# Patient Record
Sex: Female | Born: 1970
Health system: Southern US, Community
[De-identification: ages and names within clinical notes are randomized; demographics above are authoritative.]

## PROBLEM LIST (undated history)

## (undated) DIAGNOSIS — F419 Anxiety disorder, unspecified: Secondary | ICD-10-CM

## (undated) DIAGNOSIS — A048 Other specified bacterial intestinal infections: Secondary | ICD-10-CM

## (undated) DIAGNOSIS — F4010 Social phobia, unspecified: Secondary | ICD-10-CM

## (undated) DIAGNOSIS — F429 Obsessive-compulsive disorder, unspecified: Secondary | ICD-10-CM

## (undated) DIAGNOSIS — J302 Other seasonal allergic rhinitis: Secondary | ICD-10-CM

## (undated) DIAGNOSIS — F32A Depression, unspecified: Secondary | ICD-10-CM

## (undated) DIAGNOSIS — F329 Major depressive disorder, single episode, unspecified: Secondary | ICD-10-CM

## (undated) DIAGNOSIS — K589 Irritable bowel syndrome without diarrhea: Secondary | ICD-10-CM

## (undated) HISTORY — DX: Irritable bowel syndrome, unspecified: K58.9

## (undated) HISTORY — PX: TONSILLECTOMY: SUR1361

## (undated) HISTORY — DX: Anxiety disorder, unspecified: F41.9

## (undated) HISTORY — DX: Depression, unspecified: F32.A

## (undated) HISTORY — PX: NASAL SEPTUM SURGERY: SHX37

## (undated) HISTORY — DX: Major depressive disorder, single episode, unspecified: F32.9

## (undated) HISTORY — DX: Social phobia, unspecified: F40.10

## (undated) HISTORY — DX: Obsessive-compulsive disorder, unspecified: F42.9

## (undated) HISTORY — DX: Other seasonal allergic rhinitis: J30.2

## (undated) HISTORY — DX: Other specified bacterial intestinal infections: A04.8

---

## 1998-03-23 ENCOUNTER — Other Ambulatory Visit: Admission: RE | Admit: 1998-03-23 | Discharge: 1998-03-23 | Payer: Self-pay | Admitting: Obstetrics and Gynecology

## 1998-07-27 ENCOUNTER — Other Ambulatory Visit: Admission: RE | Admit: 1998-07-27 | Discharge: 1998-07-27 | Payer: Self-pay | Admitting: Obstetrics and Gynecology

## 1998-12-06 ENCOUNTER — Other Ambulatory Visit: Admission: RE | Admit: 1998-12-06 | Discharge: 1998-12-06 | Payer: Self-pay | Admitting: Obstetrics and Gynecology

## 2004-09-06 ENCOUNTER — Ambulatory Visit: Payer: Self-pay | Admitting: Gastroenterology

## 2004-10-02 ENCOUNTER — Ambulatory Visit: Payer: Self-pay | Admitting: Gastroenterology

## 2004-10-18 ENCOUNTER — Ambulatory Visit: Payer: Self-pay | Admitting: Gastroenterology

## 2004-11-01 ENCOUNTER — Ambulatory Visit: Payer: Self-pay | Admitting: Family Medicine

## 2004-12-09 ENCOUNTER — Other Ambulatory Visit: Admission: RE | Admit: 2004-12-09 | Discharge: 2004-12-09 | Payer: Self-pay | Admitting: Obstetrics and Gynecology

## 2004-12-10 ENCOUNTER — Other Ambulatory Visit: Admission: RE | Admit: 2004-12-10 | Discharge: 2004-12-10 | Payer: Self-pay | Admitting: Obstetrics and Gynecology

## 2004-12-30 ENCOUNTER — Ambulatory Visit: Payer: Self-pay | Admitting: Family Medicine

## 2005-07-08 ENCOUNTER — Inpatient Hospital Stay (HOSPITAL_COMMUNITY): Admission: AD | Admit: 2005-07-08 | Discharge: 2005-07-08 | Payer: Self-pay | Admitting: Obstetrics and Gynecology

## 2005-07-08 IMAGING — US US FETAL BPP W/O NONSTRESS
1 series · 14 of 28 positions shown · non-contrast
Comparison: none

CLINICAL DATA: BPP, assigned gestational age is 36 weeks 3 days.

[Series 1: us fetal bpp w/o nonstress · 0.33mm/px · 14 of 46 slices shown]
[im 2/46]
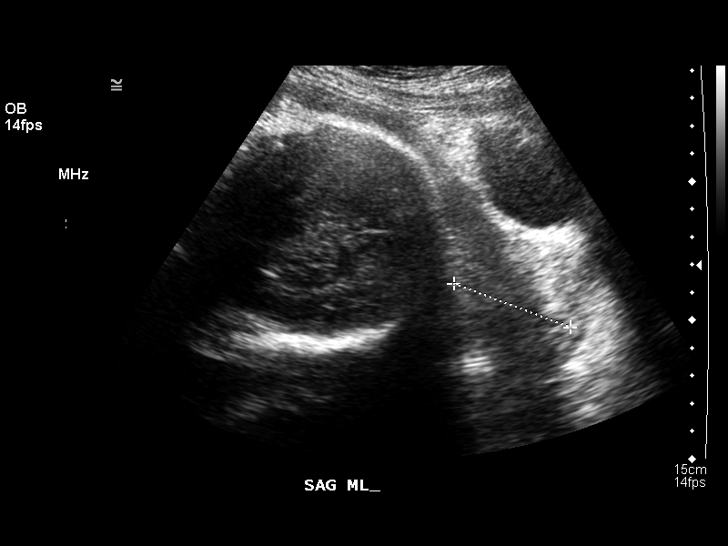
[im 6/46]
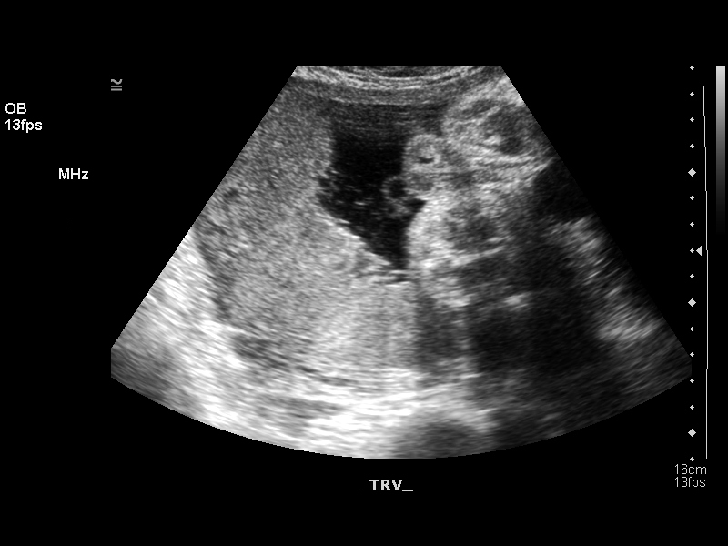
[im 9/46]
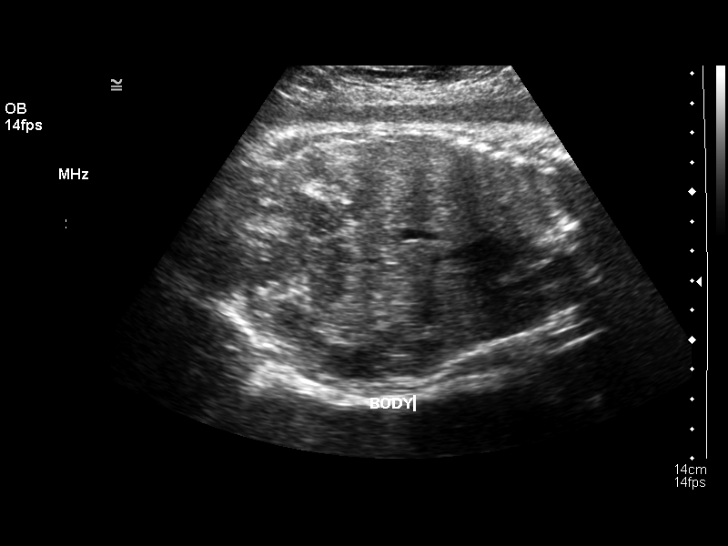
[im 12/46]
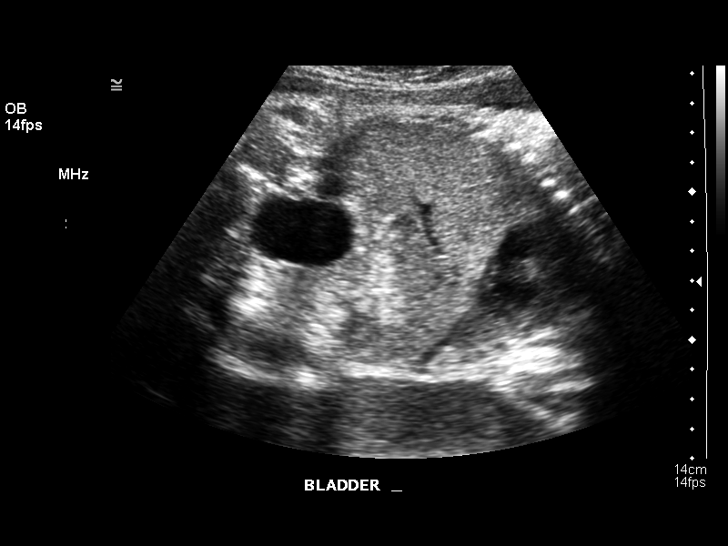
[im 16/46]
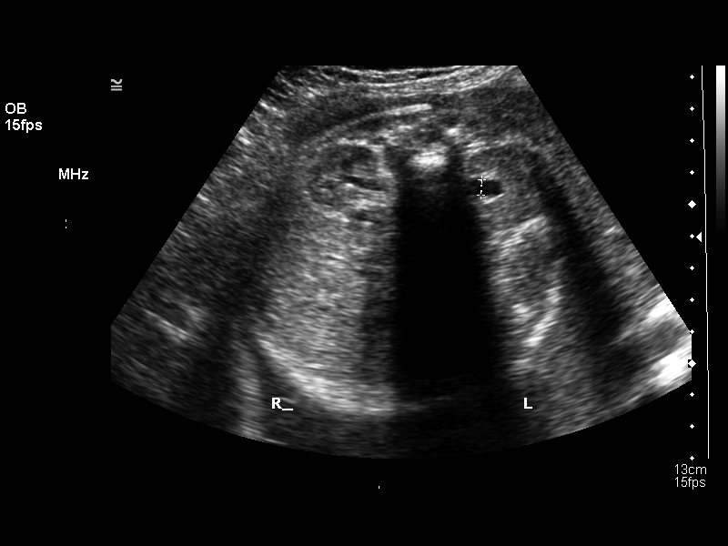
[im 19/46]
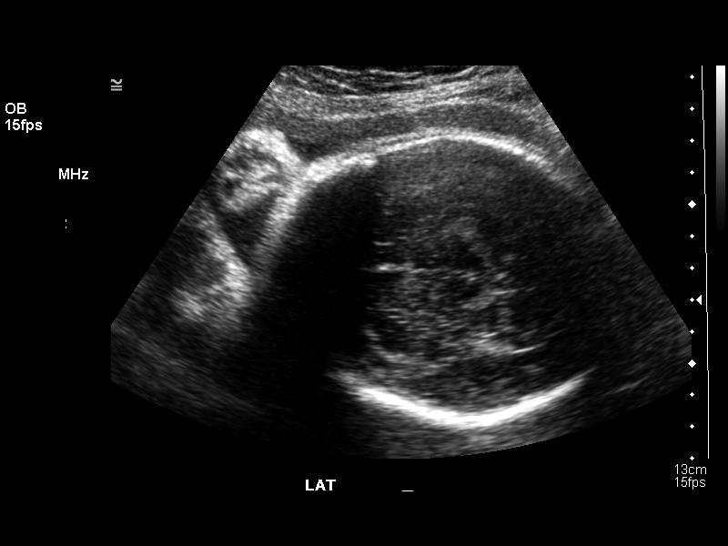
[im 22/46]
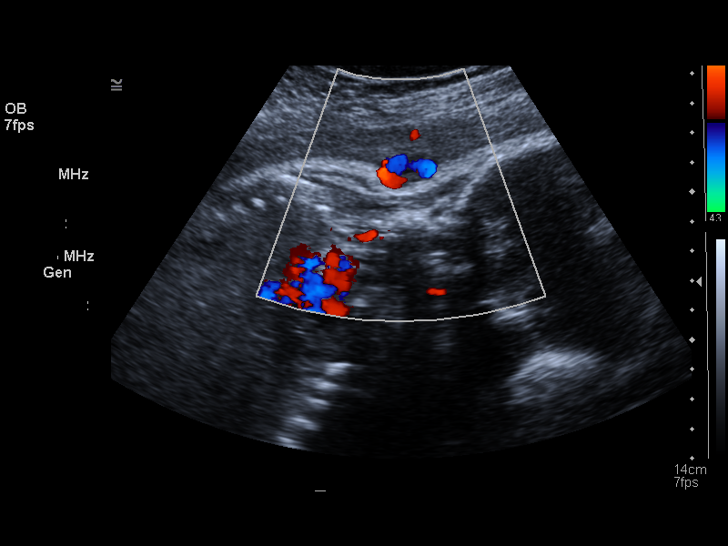
[im 26/46]
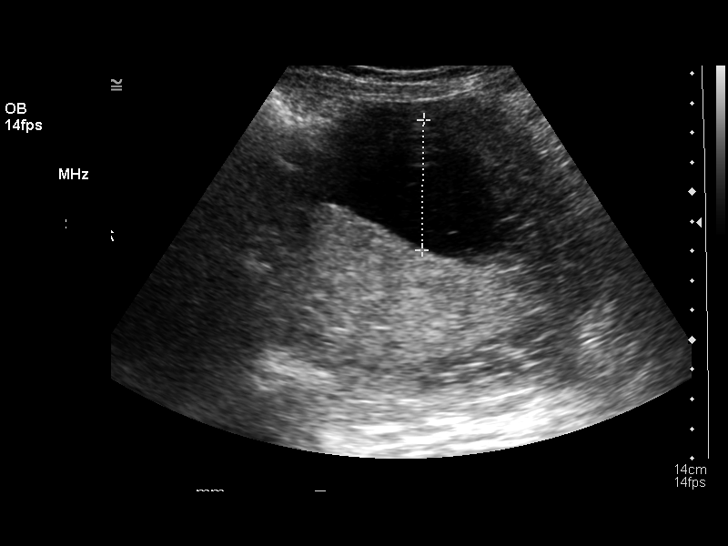
[im 29/46]
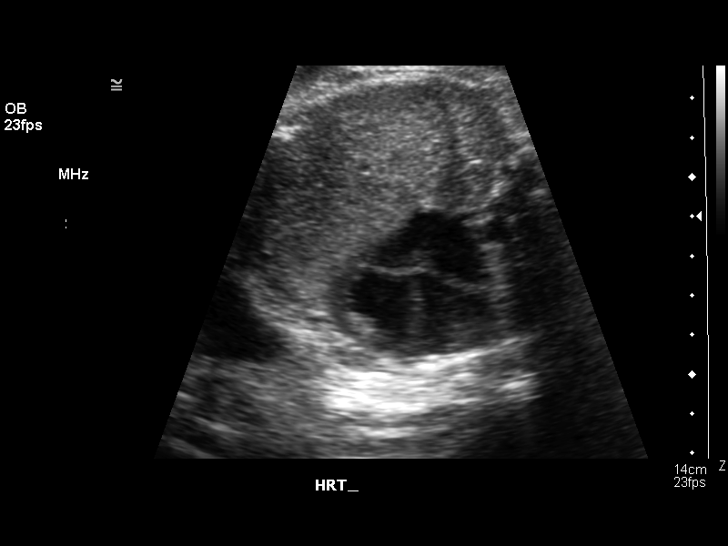
[im 32/46]
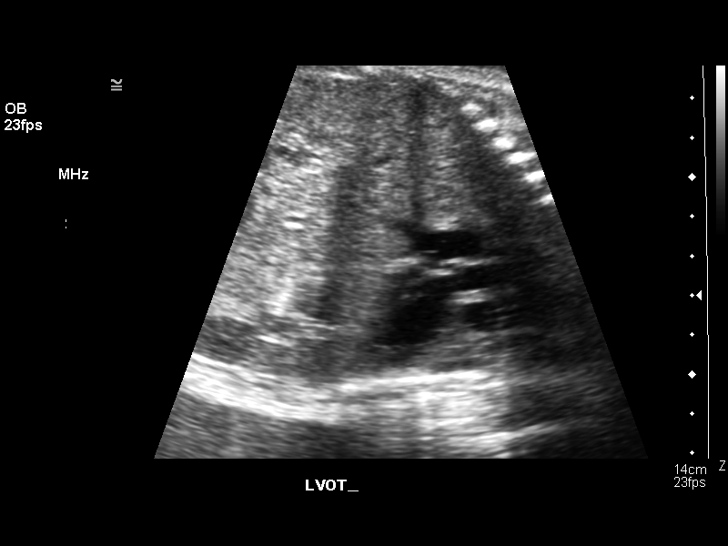
[im 36/46]
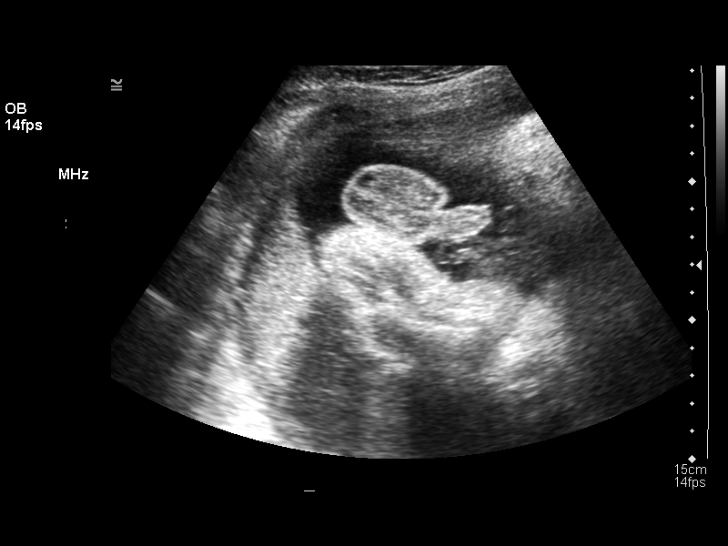
[im 39/46]
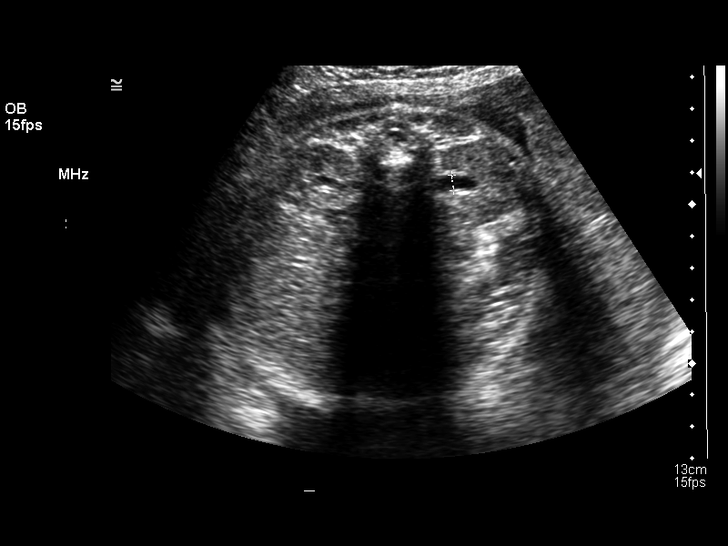
[im 42/46]
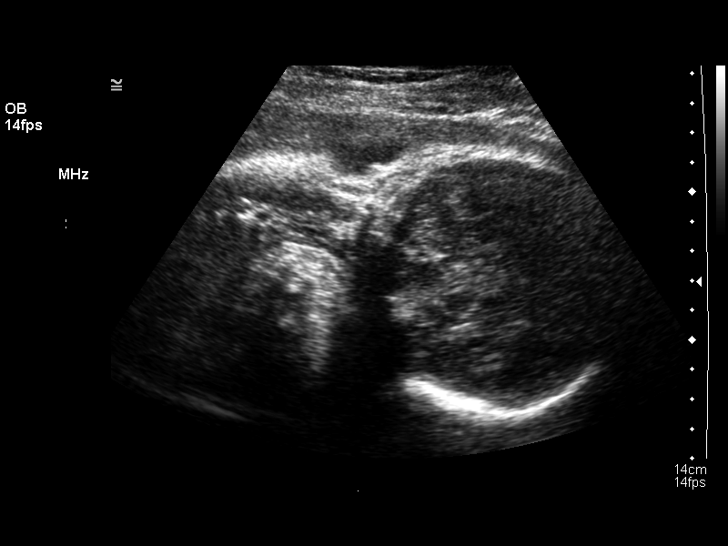
[im 46/46]
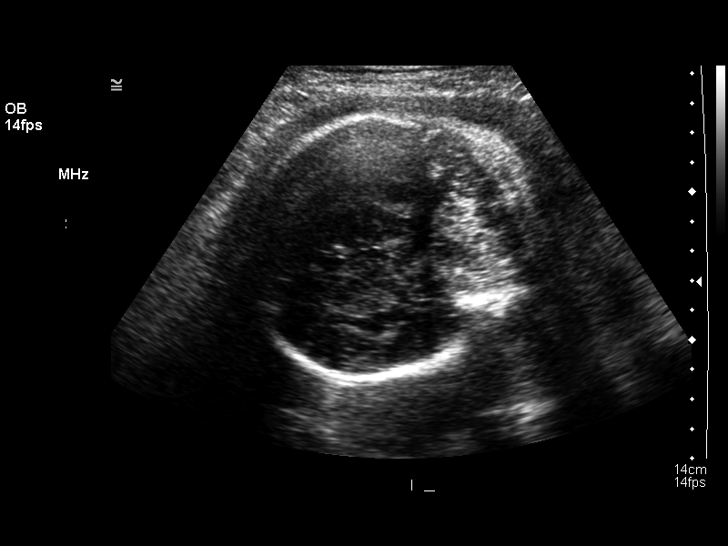

[14 of 28 positions shown; findings below may reference images not displayed]

LIMITED OBSTETRICAL ULTRASOUND:
 Number of Fetuses:  1
 Heart Rate:  114 - 144
 Movement:  Yes
 Breathing:  Yes
 Presentation:  Cephalic
 Placental Location:  Fundal, posterior
 Grade:  I
 Previa:  No
 Amniotic Fluid (Subjective):  Normal
 Amniotic Fluid (Objective):  13.4 cm AFI (5th -95th%ile = 7.7 ? 24.9 cm for 36 wks)

 Fetal measurements and complete anatomic evaluation were not requested.  The following fetal anatomy was visualized during this exam:  Lateral ventricles, thalami, posterior fossa, four chamber heart, stomach, 3-vessel cord, kidneys, bladder, LVOT, RVOT, upper lip, and diaphragm.

 MATERNAL UTERINE AND ADNEXAL FINDINGS
 Cervix: 4.4 cm Transabdominally

 BIOPHYSICAL PROFILE

 Movement:  2      Time:  20 minutes
 Breathing:  2
 Tone:  2
 Amniotic Fluid:  2

 Total Score:  8
IMPRESSION: 1.  There is a single living intrauterine gestation.  The biophysical score is [DATE] over a 20 minute period.  The amniotic fluid volume is within normal limits.  
 2.  The fetal heart rate ranged from 114 to 144 bpm during the exam.

## 2005-07-28 ENCOUNTER — Inpatient Hospital Stay (HOSPITAL_COMMUNITY): Admission: AD | Admit: 2005-07-28 | Discharge: 2005-07-29 | Payer: Self-pay | Admitting: Obstetrics & Gynecology

## 2005-07-29 ENCOUNTER — Inpatient Hospital Stay (HOSPITAL_COMMUNITY): Admission: AD | Admit: 2005-07-29 | Discharge: 2005-07-31 | Payer: Self-pay | Admitting: Obstetrics & Gynecology

## 2005-07-29 ENCOUNTER — Encounter (INDEPENDENT_AMBULATORY_CARE_PROVIDER_SITE_OTHER): Payer: Self-pay | Admitting: *Deleted

## 2005-08-13 ENCOUNTER — Ambulatory Visit: Payer: Self-pay | Admitting: Family Medicine

## 2005-09-04 ENCOUNTER — Ambulatory Visit: Payer: Self-pay | Admitting: Family Medicine

## 2006-02-19 ENCOUNTER — Ambulatory Visit: Payer: Self-pay | Admitting: Family Medicine

## 2006-03-20 ENCOUNTER — Ambulatory Visit: Payer: Self-pay | Admitting: Family Medicine

## 2006-04-14 ENCOUNTER — Ambulatory Visit: Payer: Self-pay | Admitting: Family Medicine

## 2007-02-19 ENCOUNTER — Telehealth (INDEPENDENT_AMBULATORY_CARE_PROVIDER_SITE_OTHER): Payer: Self-pay | Admitting: *Deleted

## 2007-03-26 ENCOUNTER — Ambulatory Visit: Payer: Self-pay | Admitting: Family Medicine

## 2007-03-26 DIAGNOSIS — F429 Obsessive-compulsive disorder, unspecified: Secondary | ICD-10-CM | POA: Insufficient documentation

## 2007-03-26 DIAGNOSIS — S139XXA Sprain of joints and ligaments of unspecified parts of neck, initial encounter: Secondary | ICD-10-CM | POA: Insufficient documentation

## 2007-04-30 ENCOUNTER — Encounter (INDEPENDENT_AMBULATORY_CARE_PROVIDER_SITE_OTHER): Payer: Self-pay | Admitting: *Deleted

## 2007-05-22 ENCOUNTER — Encounter (INDEPENDENT_AMBULATORY_CARE_PROVIDER_SITE_OTHER): Payer: Self-pay | Admitting: Internal Medicine

## 2007-06-16 ENCOUNTER — Ambulatory Visit: Payer: Self-pay | Admitting: Family Medicine

## 2007-12-03 ENCOUNTER — Ambulatory Visit: Payer: Self-pay | Admitting: Family Medicine

## 2008-01-05 ENCOUNTER — Ambulatory Visit: Payer: Self-pay | Admitting: Family Medicine

## 2008-02-10 ENCOUNTER — Telehealth (INDEPENDENT_AMBULATORY_CARE_PROVIDER_SITE_OTHER): Payer: Self-pay | Admitting: Internal Medicine

## 2008-03-30 ENCOUNTER — Telehealth (INDEPENDENT_AMBULATORY_CARE_PROVIDER_SITE_OTHER): Payer: Self-pay | Admitting: Internal Medicine

## 2008-05-19 ENCOUNTER — Ambulatory Visit: Payer: Self-pay | Admitting: Family Medicine

## 2008-06-16 ENCOUNTER — Encounter (INDEPENDENT_AMBULATORY_CARE_PROVIDER_SITE_OTHER): Payer: Self-pay | Admitting: Internal Medicine

## 2008-07-05 ENCOUNTER — Ambulatory Visit: Payer: Self-pay | Admitting: Family Medicine

## 2008-08-16 ENCOUNTER — Ambulatory Visit: Payer: Self-pay | Admitting: Family Medicine

## 2008-10-30 ENCOUNTER — Telehealth: Payer: Self-pay | Admitting: Family Medicine

## 2008-10-30 ENCOUNTER — Telehealth: Payer: Self-pay | Admitting: Internal Medicine

## 2008-12-02 ENCOUNTER — Ambulatory Visit: Payer: Self-pay | Admitting: Family Medicine

## 2009-01-03 ENCOUNTER — Ambulatory Visit: Payer: Self-pay | Admitting: Family Medicine

## 2009-01-11 ENCOUNTER — Encounter (INDEPENDENT_AMBULATORY_CARE_PROVIDER_SITE_OTHER): Payer: Self-pay | Admitting: Internal Medicine

## 2009-01-11 ENCOUNTER — Ambulatory Visit: Payer: Self-pay | Admitting: Psychiatry

## 2009-01-25 ENCOUNTER — Ambulatory Visit: Payer: Self-pay | Admitting: Psychiatry

## 2009-03-27 ENCOUNTER — Telehealth (INDEPENDENT_AMBULATORY_CARE_PROVIDER_SITE_OTHER): Payer: Self-pay | Admitting: Internal Medicine

## 2009-08-15 ENCOUNTER — Ambulatory Visit: Payer: Self-pay | Admitting: Family Medicine

## 2009-08-19 ENCOUNTER — Emergency Department: Payer: Self-pay | Admitting: Internal Medicine

## 2009-08-22 ENCOUNTER — Telehealth (INDEPENDENT_AMBULATORY_CARE_PROVIDER_SITE_OTHER): Payer: Self-pay | Admitting: Internal Medicine

## 2009-10-29 ENCOUNTER — Telehealth: Payer: Self-pay | Admitting: Internal Medicine

## 2010-03-15 ENCOUNTER — Telehealth: Payer: Self-pay | Admitting: Family Medicine

## 2010-03-28 ENCOUNTER — Ambulatory Visit: Payer: Self-pay | Admitting: Family Medicine

## 2010-03-28 DIAGNOSIS — R5383 Other fatigue: Secondary | ICD-10-CM

## 2010-03-28 DIAGNOSIS — R5381 Other malaise: Secondary | ICD-10-CM | POA: Insufficient documentation

## 2010-03-28 LAB — CONVERTED CEMR LAB: Vit D, 25-Hydroxy: 41 ng/mL (ref 30–89)

## 2010-03-29 ENCOUNTER — Encounter: Payer: Self-pay | Admitting: Family Medicine

## 2010-03-29 LAB — CONVERTED CEMR LAB: T3, Free: 2.8 pg/mL (ref 2.3–4.2)

## 2010-04-01 LAB — CONVERTED CEMR LAB
BUN: 12 mg/dL (ref 6–23)
CO2: 28 meq/L (ref 19–32)
Calcium: 9.5 mg/dL (ref 8.4–10.5)
Chloride: 106 meq/L (ref 96–112)
Creatinine, Ser: 0.8 mg/dL (ref 0.4–1.2)
Free T4: 0.9 ng/dL (ref 0.6–1.6)
GFR calc non Af Amer: 84.33 mL/min (ref >60)
Glucose, Bld: 83 mg/dL (ref 70–99)
Potassium: 4.6 meq/L (ref 3.5–5.1)
Sodium: 141 meq/L (ref 135–145)
TSH: 1.4 microintl units/mL (ref 0.35–5.50)
Vitamin B-12: 290 pg/mL (ref 211–911)

## 2010-05-23 ENCOUNTER — Encounter (INDEPENDENT_AMBULATORY_CARE_PROVIDER_SITE_OTHER): Payer: Self-pay | Admitting: *Deleted

## 2010-07-02 ENCOUNTER — Telehealth: Payer: Self-pay | Admitting: Family Medicine

## 2010-09-03 ENCOUNTER — Telehealth: Payer: Self-pay | Admitting: Family Medicine

## 2010-09-17 ENCOUNTER — Telehealth: Payer: Self-pay | Admitting: Family Medicine

## 2010-09-18 ENCOUNTER — Ambulatory Visit: Payer: Self-pay | Admitting: Family Medicine

## 2010-09-19 LAB — CONVERTED CEMR LAB: Hepatitis B-Post: 36.7 milliintl units/mL

## 2010-09-20 ENCOUNTER — Encounter: Payer: Self-pay | Admitting: Family Medicine

## 2010-10-11 ENCOUNTER — Encounter: Payer: Self-pay | Admitting: Family Medicine

## 2010-11-21 NOTE — Progress Notes (Signed)
Summary: Bupropion XL refill  Phone Note Refill Request Call back at [facility] [phone] Message from:  Fax from Pharmacy on March 27, 2009 3:00 PM  Refills Requested: Medication #1:  WELLBUTRIN XL 300 MG  TB24 1 once daily   Last Refilled: 02/05/2009 [facility] faxed request for Bupropion XL  refill. Please advise.   Method Requested: Telephone to Pharmacy Initial call taken by: Lewanda Rife,  March 27, 2009 3:01 PM  Follow-up for Phone Call        Rx completed  Gildardo Griffes FNP  March 27, 2009 4:49 PM       Prescriptions: WELLBUTRIN XL 300 MG  TB24 (BUPROPION HCL) 1 once daily  #30 x 6   Entered and Authorized by:   Gildardo Griffes FNP   Signed by:   Gildardo Griffes FNP on 03/27/2009   Method used:   Electronically to        [facility]* (retail)       [address]       [place], [place]  [address]       Ph: [phone]       Fax: [phone]   RxID:   [id number]

## 2010-11-21 NOTE — Progress Notes (Signed)
Summary: refill request for zoloft  Phone Note Refill Request Message from:  Fax from Pharmacy  Refills Requested: Medication #1:  ZOLOFT 100 MG TABS 1 q day   Last Refilled: 09/29/2008 Faxed request from [place].  Initial call taken by: Lowella Petties,  October 30, 2008 2:41 PM      Prescriptions: ZOLOFT 100 MG TABS (SERTRALINE HCL) 1 q day  #30 x 6   Entered and Authorized by:   Shaune Leeks MD   Signed by:   Shaune Leeks MD on 10/30/2008   Method used:   Electronically to        [facility]* (retail)       [address]       [place], [place]  [address]       Ph: [phone]       Fax: [phone]   RxID:   [id number]

## 2010-11-21 NOTE — Letter (Signed)
Summary: [facility]   [facility]   Imported By: Lester [place] 01/17/2009 09:46:53  _____________________________________________________________________  External Attachment:    Type:   Image     Comment:   External Document

## 2010-11-21 NOTE — Progress Notes (Signed)
Summary: needs tb and tetanus   Phone Note Call from Patient Call back at Home Phone [phone]   Caller: Patient Call For: Dr. Patsy Lager  Summary of Call: Patient wants to schedule nurse visit for tb, and tetanus, she is starting a new job. Is this okay to schedule. Please advise.  Initial call taken by: Melody Comas,  September 03, 2010 2:21 PM  Follow-up for Phone Call        Absolutely..no need to ask me.  Follow-up by: Kerby Nora MD,  September 03, 2010 5:37 PM  Additional Follow-up for Phone Call Additional follow up Details #1::        Patient advised.Consuello Masse CMA   Additional Follow-up by: Benny Lennert CMA Duncan Dull),  September 04, 2010 8:07 AM

## 2010-11-21 NOTE — Progress Notes (Signed)
Summary: wants Hep B titre  Phone Note Call from Patient   Caller: Patient Summary of Call: Pt is coming in for td and  ppd tomorrow and asks if she can get a Hep B titre as well.  She said the got the series of injections several years ago.  Just let her know when she comes in tomorrow. Initial call taken by: Lowella Petties CMA, AAMA,  September 17, 2010 10:29 AM  Follow-up for Phone Call        yes hep B titier Dx v05.8 Follow-up by: Kerby Nora MD,  September 17, 2010 11:15 AM  Additional Follow-up for Phone Call Additional follow up Details #1::        Patient on schedule for hep b titer at 8:15am with her nurse visit Additional Follow-up by: Benny Lennert CMA Duncan Dull),  September 17, 2010 11:17 AM

## 2010-11-21 NOTE — Assessment & Plan Note (Signed)
Summary: ST/CLE   Vital Signs:  Patient Profile:   40 Years Old Female Weight:      186.25 pounds Temp:     97.7 degrees F oral Pulse rate:   68 / minute BP sitting:   104 / 72  (left arm) Cuff size:   large  Vitals Entered By: Shonna Chock (December 02, 2008 12:40 PM)                 Chief Complaint:  SORE THROAT SINCE WED.  Acute Visit History:      The patient complains of sore throat.  These symptoms began 3 days ago.  She denies cough, earache, fever, nasal discharge, and sinus problems.  Other comments include: no sick contacts.          Current Allergies (reviewed today): No known allergies    Social History:    Reviewed history from 01/05/2008 and no changes required:       Marital Status: Married       Children: One daughter now 48yrs and son 2--3/09       Occupation: Dental Hygienist--part time    Review of Systems      See HPI   Physical Exam  General:     Well-developed,well-nourished,in no acute distress; alert,appropriate and cooperative throughout examination Ears:     External ear exam shows no significant lesions or deformities.  Otoscopic examination reveals clear canals, tympanic membranes are intact bilaterally without bulging, retraction, inflammation or discharge. Hearing is grossly normal bilaterally. Nose:     External nasal examination shows no deformity or inflammation. Nasal mucosa are pink and moist without lesions or exudates. Mouth:     no exudates and pharyngeal erythema.   Neck:     no cervical or supraclavicular lymphadenopathy  Lungs:     Normal respiratory effort, chest expands symmetrically. Lungs are clear to auscultation, no crackles or wheezes. Heart:     Normal rate and regular rhythm. S1 and S2 normal without gallop, murmur, click, rub or other extra sounds.     Impression & Recommendations:  Problem # 1:  URI (ICD-465.9) Instructed on symptomatic treatment. Call if symptoms persist or worsen.   Complete  Medication List: 1)  Zoloft 100 Mg Tabs (Sertraline hcl) .Marland Kitchen.. 1 daily 2)  Wellbutrin Xl 300 Mg Tb24 (Bupropion hcl) .Marland Kitchen.. 1 once daily 3)  Doxycycline Hyclate Powd (Doxycycline hyclate) .... Take 1 tablet by mouth once a day  Other Orders: Rapid Strep (40981)

## 2010-11-21 NOTE — Progress Notes (Signed)
Summary: needs new script for sertraline  Phone Note From Pharmacy   Caller: [facility] Summary of Call: [facility]ine, form is on your desk. Initial call taken by: Lowella Petties CMA,  July 02, 2010 8:08 AM  Follow-up for Phone Call        Done Follow-up by: Kerby Nora MD,  July 02, 2010 8:16 AM

## 2010-11-21 NOTE — Assessment & Plan Note (Signed)
Summary: Amy Marks TO BE EST / LFW   Vital Signs:  Marks profile:   40 year old female Height:      63.5 inches Weight:      177.25 pounds BMI:     31.02 Temp:     98.1 degrees F oral Pulse rate:   80 / minute Pulse rhythm:   regular BP sitting:   122 / 82  (left arm) Cuff size:   regular  Vitals Entered By: Delilah Shan CMA Duncan Dull) (March 28, 2010 8:57 AM) CC: BDB Marks to Re-Establish   History of Present Illness: 40 year old Marks well known to me:  Over the winter, would stay really cold and would feel tired all the time. Would feel tired during the middles of the day.   Has had some conjunctivitis, has diarrhea a lot.   Also her foot fell asleep and taylor called for her and now has some lateral ankle swelling.  Sometimes will feel like feet or hands will be asleep.   Sometimes will get really anxious. Gets pretty anxious with other people.     Seeing Dr. Henderson Cloud, OB  Allergies: No Known Drug Allergies  Past History:  Past medical, surgical, family and social histories (including risk factors) reviewed for relevance to current acute and chronic problems.  Past Medical History: DISORDERS, OBSESSIVE-COMPULSIVE? Anxiety    Past Surgical History: Reviewed history from 04/19/2007 and no changes required. Tonsilectomy at age 7 Repair of a deviated septum and removal of nasal polyps   Family History: Reviewed history from 04/19/2007 and no changes required. Father: Alive, alcohol abuse Mother: Living with fibromyalgia and depression Siblings: One sister Maternal grandmother alive with emphysema, was a smoker, and had breast ca Maternal grandfather died of coplications of multiple sclerosis Both of her paternal grandparents have died,  cause of grandfather is not known. The grandmother is felt to be old age  Social History: Reviewed history from 01/05/2008 and no changes required. Marital Status: Married Children: One daughter now 55yrs and  son 2--3/09 Occupation: Dental Hygienist--part time  Review of Systems      See HPI General:  Complains of fatigue; denies fever. GI:  Denies nausea and vomiting.  Physical Exam  General:  GEN: WDWN, NAD, Non-toxic, A & O x 3 HEENT: Atraumatic, Normocephalic. Neck supple. No masses, No LAD. Ears and Nose: No external deformity. CV: RRR, No M/G/R. No JVD. No thrill. No extra heart sounds. PULM: CTA B, no wheezes, crackles, rhonchi. No retractions. No resp. distress. No accessory muscle use. EXTR: No c/c/e NEURO: Normal gait.  PSYCH: Normally interactive. Conversant. Not depressed or anxious appearing.  Calm demeanor.     Impression & Recommendations:  Problem # 1:  FATIGUE (ICD-780.79) I discussed the Marks the multifactorial nature of fatigue and that many etiologies are possible. Initially a work-up to rule out anemia, thyroid dysfunction, diabetes, electrolyte dysfunction, normal kidney and liver function is most prudent. Also discussed that sleep, sleep apnea, medication usages can be the cause.  Additionally we discussed the role that depression and mood can play on energy levels and fatigue.   Orders: Venipuncture ([address]) TLB-TSH (Thyroid Stimulating Hormone) (84443-TSH) TLB-T4 (Thyrox), Free [phone]) TLB-BMP (Basic Metabolic Panel-BMET) (80048-METABOL)  Problem # 2:  PARESTHESIA (ICD-782.0) occ tingling  Orders: T-Vitamin D (25-Hydroxy) (19147-82956) Specimen Handling ([address]08) TLB-B12, Serum-Total ONLY (65784-O96)  Problem # 3:  DISORDERS, OBSESSIVE-COMPULSIVE (ICD-300.3) and anxiety may be impacting 1 and 2  Complete Medication List: 1)  Zoloft 100 Mg  Tabs (Sertraline hcl) .Marland Kitchen.. 1 daily 2)  Wellbutrin Xl 300 Mg Tb24 (Bupropion hcl) .Marland Kitchen.. 1 once daily 3)  Sodium Sulamyd 10%  .Marland Kitchen.. 1-2 drops once daily 4)  Ibuprofen 200 Mg Tabs (Ibuprofen) .... As needed  Current Allergies (reviewed today): No known allergies

## 2010-11-21 NOTE — Assessment & Plan Note (Signed)
Summary: 7 M F/U  DLO   Vital Signs:  Patient Profile:   40 Years Old Female Weight:      169 pounds Temp:     98.6 degrees F oral Pulse rate:   76 / minute BP sitting:   102 / 78  (right arm) Cuff size:   regular  Vitals Entered By: Cooper Render (July 05, 2008 8:29 AM)                 Chief Complaint:  6 mth f/u.  History of Present Illness: Here fo r6 mo follow up of OCD--Wellbutrin added to zoloft 6 mo ago, thinks is better.  Less snappy, happier.  Reports marriage not happy, do little together.    Current Allergies (reviewed today): No known allergies      Review of Systems      See HPI   Physical Exam  General:     alert, well-developed, well-nourished, and well-hydrated.   Psych:     Oriented X3, normally interactive, not anxious appearing, flat affect, and subdued.      Impression & Recommendations:  Problem # 1:  DISORDERS, OBSESSIVE-COMPULSIVE (ICD-300.3) Assessment: Unchanged will continue on wellbutrin xl 300 once daily will continue zoloft 100 once daily discussed counseling--she will let me know denies homocidal or suicidal ideation see back 6 mo or sooner as needed   Complete Medication List: 1)  Zoloft 100 Mg Tabs (Sertraline hcl) .Marland Kitchen.. 1 q day 2)  Wellbutrin Xl 300 Mg Tb24 (Bupropion hcl) .Marland Kitchen.. 1 once daily 3)  Doxycycline Hyclate Powd (Doxycycline hyclate) .... Take 1 tablet by mouth once a day   Patient Instructions: 1)  Please schedule a follow-up appointment in 6 months.   ] Prior Medications (reviewed today): ZOLOFT 100 MG TABS (SERTRALINE HCL) 1 q day WELLBUTRIN XL 300 MG  TB24 (BUPROPION HCL) 1 once daily DOXYCYCLINE HYCLATE  POWD (DOXYCYCLINE HYCLATE) Take 1 tablet by mouth once a day Current Allergies (reviewed today): No known allergies

## 2010-11-21 NOTE — Progress Notes (Signed)
  Phone Note Call from Patient Call back at [facility] Phone [phone] Call back at [phone]   Caller: Patient Call For: Dr.Ann Bohne Summary of Call: Pt. was seeing [name].  She asked if she could see you.  Her children,Taylor and Logan,are pt's of yours.  If you do agree to see her,she'd like to have her Thyroid checked.  Please advise. Initial call taken by: Beau Fanny,  Mar 15, 2010 1:12 PM  Follow-up for Phone Call        I know whole family very well - would be happy to see her.  It looks like she has not had a check-up in over a year and that she does not have known thyroid problem. We should schedule a convenient time for her to talk about what is going and why she wants to check it.   Thanks! (30 mins for new Billie pt - non-emergent, whenever is good for her) Follow-up by: Hannah Beat MD,  Mar 18, 2010 12:18 PM  Additional Follow-up for Phone Call Additional follow up Details #1::        I left a message on pt's home answering machine to call and schedule a 30 min appt.. Additional Follow-up by: Beau Fanny,  Mar 19, 2010 9:20 AM

## 2010-11-21 NOTE — Assessment & Plan Note (Signed)
Summary: tb reading  Nurse Visit   Allergies: No Known Drug Allergies  PPD Results    Date of reading: 09/20/2010    Results: < 5mm    Interpretation: negative

## 2010-11-21 NOTE — Assessment & Plan Note (Signed)
Summary: FOLLOW UP   Vital Signs:  Patient Profile:   40 Years Old Female Weight:      174 pounds Temp:     98.4 degrees F oral Pulse rate:   68 / minute BP sitting:   100 / 71  (right arm) Cuff size:   regular  Vitals Entered By: Cooper Render (January 05, 2008 9:05 AM)                 Chief Complaint:  f/u.  History of Present Illness: Here for eval of addition of wellbutrin to zoloft 100 for OCD--better, not as snappy with children and less ofteen.  Better with husband, libito better, less snappy.  Husband with fx leg since 1/09--no work since fx.  Wants to continue.  No family hx of bipolar.  Mother and husband have noted marked change for the better.    Current Allergies (reviewed today): No known allergies    Social History:    Marital Status: Married    Children: One daughter now 53yrs and son 2--3/09    Occupation: Dental Hygienist--part time    Review of Systems      See HPI   Physical Exam  General:     alert, well-developed, well-nourished, and well-hydrated.   Neurologic:     alert & oriented X3 and gait normal.   Psych:     normally interactive, good eye contact, not anxious appearing, and not depressed appearing.      Impression & Recommendations:  Problem # 1:  DISORDERS, OBSESSIVE-COMPULSIVE (ICD-300.3) Assessment: Improved willcontinue both Wellbutrin and Zoloft at current doses denies homocidal or suicidal ideation see back in 6 mo or prn  Complete Medication List: 1)  Zoloft 100 Mg Tabs (Sertraline hcl) .Marland Kitchen.. 1 q day 2)  Wellbutrin Xl 300 Mg Tb24 (Bupropion hcl) .Marland Kitchen.. 1 qam   Patient Instructions: 1)  Please schedule a follow-up appointment in 6 months.    Prescriptions: WELLBUTRIN XL 300 MG  TB24 (BUPROPION HCL) 1 qam  #30 x 6   Entered and Authorized by:   Gildardo Griffes FNP   Signed by:   Gildardo Griffes FNP on 01/05/2008   Method used:   Print then Give to Patient   RxID:   [id number]  ] Prior  Medications (reviewed today): ZOLOFT 100 MG TABS (SERTRALINE HCL) 1 q day Current Allergies (reviewed today): No known allergies

## 2010-11-21 NOTE — Letter (Signed)
Summary: [place] No Show Letter  [place] at [facility]  [address] [place], [place] [address]   Phone: [phone]  Fax: [phone]    04/30/2007     Dear [name],     Our records indicate that you missed your scheduled appointment with _[name],FNP on 04/30/07. Please contact this office to reschedule your appointment as soon as possible.  It is important that you keep your scheduled appointments with your physician, so we can provide you the best care possible.  Please be advised that there may be a charge for "no show" appointments.    Sincerely,    Amy Bean,FNP/K. Henreitta C[facility] at [facility]

## 2010-11-21 NOTE — Assessment & Plan Note (Signed)
Summary: ?SINUS INFECTION/CLE   Vital Signs:  Patient Profile:   40 Years Old Female Temp:     98.3 degrees F oral Pulse rate:   88 / minute BP sitting:   96 / 71  (left arm) Cuff size:   regular  Vitals Entered By: Cooper Render (June 16, 2007 3:19 PM)               Chief Complaint:  1) sinus infection 2) MRSA L) leg 3) discuss zoloft 4) med refill.  History of Present Illness: Here for follow up of Zoloft.  Started on 200mg  last office visit, she cut back to 150mg  as the 200 made her not feel well--working.  OCD under control.  Has gained wt.---not able to weigh today due to need to carry both boys.  Saw Dr Parke Simmers for skin lesion L lower leg--cultured, MRSA--had been on septra---off x 2wks, still draining.  ---needs to go back, understands.  Nasal congestion for 1d, getting bad quickly  Current Allergies (reviewed today): No known allergies      Review of Systems      See HPI   Physical Exam  General:     alert, well-developed, well-nourished, and well-hydrated.   Head:     normocephalic and atraumatic.   Eyes:     pupils equal, pupils round, and no injection.   Ears:     R ear normal and L ear normal.   Nose:     eryth, edema, sinuses neg Mouth:     injected, no exudate Lungs:     normal respiratory effort and normal breath sounds.   Neurologic:     alert & oriented X3, sensation intact to light touch, and gait normal.   Skin:     lesion L lat lower leg covered, reveals 2mm opening wiwth eryth surrounding, recovered Axillary Nodes:     no R axillary adenopathy and no L axillary adenopathy.   Psych:     normally interactive, good eye contact, not anxious appearing, not depressed appearing, and agitated--has 2 avtive toddlers in exam room      Impression & Recommendations:  Problem # 1:  U R I (ICD-465.9) Assessment: New  The following medications were removed from the medication list:    Motrin 800 Mg Tabs (Ibuprofen) .Marland Kitchen... 1 q8h with  food septra DS 1 two times a day  see back in 5-7d if not imp  Problem # 2:  DISORDERS, OBSESSIVE-COMPULSIVE (ICD-300.3) Assessment: Improved cont Zoloft 150mg  once daily see back 6 mo  Complete Medication List: 1)  Zoloft 100 Mg Tabs (Sertraline hcl) .Marland Kitchen.. 1 q day 2)  Zoloft 50 Mg Tabs (Sertraline hcl) .... Take 1 tablet by mouth once a day 3)  Septra Ds 800-160 Mg Tabs (Sulfamethoxazole-trimethoprim) .Marland Kitchen.. 1 bid 4)  Zoloft 100 Mg Tabs (Sertraline hcl) .Marland Kitchen.. 1 and 1/2 tabs qd   Patient Instructions: 1)  Please schedule a follow-up appointment in 6 months.    Prescriptions: ZOLOFT 100 MG  TABS (SERTRALINE HCL) 1 and 1/2 tabs qd  #45 x 6   Entered and Authorized by:   Gildardo Griffes FNP   Signed by:   Gildardo Griffes FNP on 06/16/2007   Method used:   Print then Give to Patient   RxID:   [id number] SEPTRA DS 800-160 MG  TABS (SULFAMETHOXAZOLE-TRIMETHOPRIM) 1 bid  #20 x 0   Entered and Authorized by:   Gildardo Griffes FNP   Signed by:   Mcarthur Rossetti  Bean FNP on 06/16/2007   Method used:   Print then Give to Patient   RxID:   [id number]

## 2010-11-21 NOTE — Progress Notes (Signed)
Summary: Concerned with pink eye  Phone Note Call from Patient Call back at [phone]   Caller: Patient Call For: Amy Coombe FNP Summary of Call: Pt saw [name] last week with pink eye. Pt wasn't better so went to ER and was given steroids. Now only a little better. Pt wants to know if you want to recheck her or send her to eye doctor. Please advise.  Initial call taken by: Lewanda Rife LPN,  August 22, 2009 9:04 AM  Follow-up for Phone Call        needs to go to Ophthmologist  [name] FNP  August 22, 2009 11:50 AM     Additional Follow-up for Phone Call Additional follow up Details #2::    Appt made with Dr Inez Pilgrim on 08/23/2009 at 8:00. [name]  August 23, 2009 8:24 AM  Follow-up by: [name],  August 23, 2009 8:24 AM

## 2010-11-21 NOTE — Medication Information (Signed)
[facility]: [facility]   Imported By: Lester Deming 10/22/2010 10:18:31  _____________________________________________________________________  External Attachment:    Type:   Image     Comment:   External Document

## 2010-11-21 NOTE — Assessment & Plan Note (Signed)
Summary: 7 M F/U  DLO   Vital Signs:  Patient Profile:   40 Years Old Female Weight:      168 pounds Temp:     98.3 degrees F oral Pulse rate:   64 / minute BP sitting:   91 / 68  (left arm) Cuff size:   regular  Vitals Entered By: Cooper Render (December 03, 2007 10:02 AM)                 Chief Complaint:  6 mth f/u.  History of Present Illness: Here fo follow up of OCD--now on Zoloft 100mg .  Reports that mother and husband say she is angry and looses temper with her kids.  Having decreased libito. Has tried: Lexapro--does not remember response                 Zoloft--decreased libito                 Prozac--no improvement                Celexa--hasn't tried                 Wellbutrin--not sure of response     Current Allergies (reviewed today): No known allergies       Physical Exam  General:     alert, well-developed, well-nourished, and well-hydrated.  NAD Eyes:     pupils equal, pupils round, and no injection.   Neurologic:     alert & oriented X3 and gait normal.   Psych:     normally interactive, good eye contact, labile affect, slightly anxious, easily distracted, and poor concentration.      Impression & Recommendations:  Problem # 1:  DISORDERS, OBSESSIVE-COMPULSIVE (ICD-300.3) Assessment: Unchanged will continue Zoloft at 100mg  for now and add Wellbutrin for sexual dysfunction ee back in 1 mo discussed referra to paychiatrist if not improved in 1 mo--she agrees  Problem # 2:  SEXUAL DYSFUNCTION (ICD-302.70) Assessment: Unchanged  Complete Medication List: 1)  Zoloft 100 Mg Tabs (Sertraline hcl) .Marland Kitchen.. 1 q day 2)  Wellbutrin Xl 300 Mg Tb24 (Bupropion hcl) .Marland Kitchen.. 1 qam po   Patient Instructions: 1)  Please schedule a follow-up appointment in 1 month.    Prescriptions: WELLBUTRIN XL 300 MG  TB24 (BUPROPION HCL) 1 qam po  #30 x 1   Entered and Authorized by:   Gildardo Griffes FNP   Signed by:   Gildardo Griffes FNP on  12/03/2007   Method used:   Print then Give to Patient   RxID:   [id number]  ] Prior Medications (reviewed today): ZOLOFT 100 MG TABS (SERTRALINE HCL) 1 q day WELLBUTRIN XL 300 MG  TB24 (BUPROPION HCL) 1 qam po Current Allergies (reviewed today): No known allergies

## 2010-11-21 NOTE — Letter (Signed)
Summary: Amy Marks letter  [place] at [facility]  [address] [place], [place] [address]   Phone: [phone]  Fax: [phone]       05/23/2010 MRN: [id number]  [facility] [address] [place], [place]  [address]  Dear Ms. [facility] [place], and [place] announce the retirement of Arta Silence, M.D., from full-time practice at the [facility] office effective April 18, 2010 and his plans of returning part-time.  It is important to Dr. Hetty Ely and to our practice that you understand that [facility] has seven physicians in our office for your health care needs.  We will continue to offer the same exceptional care that you have today.    Dr. Hetty Ely has spoken to many of you about his plans for retirement and returning part-time in the fall.   We will continue to work with you through the transition to schedule appointments for you in the office and meet the high standards that [place] is committed to.   Again, it is with great pleasure that we share the news that Dr. Hetty Ely will return to [facility] in October of 2011 with a reduced schedule.    If you have any questions, or would like to request an appointment with one of our physicians, please call us at [phone] and press the option for Scheduling an appointment.  We take pleasure in providing you with excellent patient care and look forward to seeing you at your next office visit.  Our [facility] Physicians are:  Tillman Abide, M.D. Laurita Quint, M.D. Roxy Manns, M.D. Kerby Nora, M.D. Hannah Beat, M.D. Ruthe Mannan, M.D. We proudly welcomed Raechel Ache, M.D. and Eustaquio Boyden, M.D. to the practice in July/August 2011.  Sincerely,  [place] [facility]

## 2010-11-21 NOTE — Progress Notes (Signed)
Summary: Zoloft  Phone Note Refill Request Message from:  [place] on October 30, 2008 5:29 PM  Refills Requested: Medication #1:  ZOLOFT 100 MG TABS 1 q day   Last Refilled: 09/29/2008 Amy Marks patient, form on your desk.   Method Requested: Fax to Local Pharmacy Initial call taken by: Mervin Hack CMA,  October 30, 2008 5:30 PM  Follow-up for Phone Call        Rx done Follow-up by: Cindee Salt MD,  October 30, 2008 5:59 PM    New/Updated Medications: ZOLOFT 100 MG TABS (SERTRALINE HCL) 1 daily   Prescriptions: ZOLOFT 100 MG TABS (SERTRALINE HCL) 1 daily  #30 x 11   Entered and Authorized by:   Cindee Salt MD   Signed by:   Cindee Salt MD on 10/30/2008   Method used:   Electronically to        [facility]* (retail)       [address]       [place], [place]  [address]       Ph: [phone]       Fax: [phone]   RxID:   [id number]

## 2010-11-21 NOTE — Letter (Signed)
Summary: [place] No Show Letter  [place] at [facility]  [address] [place], [place] [address]   Phone: [phone]  Fax: [phone]    04/30/2007   Dear Ms. Wiltrout,   Our records indicate that you missed your scheduled appointment with _____________________ on ____________.  Please contact this office to reschedule your appointment as soon as possible.  It is important that you keep your scheduled appointments with your physician, so we can provide you the best care possible.  Please be advised that there may be a charge for "no show" appointments.    Sincerely,   [place] at [facility]

## 2010-11-21 NOTE — Progress Notes (Signed)
Summary:  ZOLOFT  Phone Note Refill Request Message from:  [facility] on October 29, 2009 4:32 PM  Refills Requested: Medication #1:  ZOLOFT 100 MG TABS 1 daily   Last Refilled: 09/29/2009 Form on your desk    Method Requested: Fax to Local Pharmacy Initial call taken by: DeShannon Smith CMA Duncan Dull),  October 29, 2009 4:32 PM  Follow-up for Phone Call        has been seen lately will refill for 1 year Follow-up by: Cindee Salt MD,  October 29, 2009 5:39 PM    Prescriptions: ZOLOFT 100 MG TABS (SERTRALINE HCL) 1 daily  #30 x 11   Entered and Authorized by:   Cindee Salt MD   Signed by:   Cindee Salt MD on 10/29/2009   Method used:   Electronically to        [facility]* (retail)       [address]       [place], [place]  [address]       Ph: [phone]       Fax: [phone]   RxID:   [id number]

## 2010-11-21 NOTE — Progress Notes (Signed)
Summary: wellbutrin refill  Phone Note Refill Request Message from:  Fax from Pharmacy on February 10, 2008 4:44 PM  Refills Requested: Medication #1:  WELLBUTRIN XL 300 MG  TB24 1 qam.   Last Refilled: 12/30/2007 Initial call taken by: Cooper Render,  February 10, 2008 4:45 PM  Follow-up for Phone Call        Rx written 3/09 for wellbutrin with 6 refills--where is Rx?  ..................................................................Marland KitchenGildardo Griffes FNP  February 10, 2008 4:49 PM   Additional Follow-up for Phone Call Additional follow up Details #1::        rx written 12/03/07 w/ 1 refill   ..................................................................Marland KitchenCooper Render  February 10, 2008 5:00 PM med phoned to pharmacy. Additional Follow-up by: Cooper Render,  February 10, 2008 5:33 PM

## 2010-11-21 NOTE — Assessment & Plan Note (Signed)
Summary: TB TEST,TETENUS/COPLAND/CLE  Nurse Visit   Vitals Entered By: Benny Lennert CMA Duncan Dull) (September 18, 2010 8:37 AM)  Allergies: No Known Drug Allergies  Immunizations Administered:  Tetanus Vaccine:    Vaccine Type: Tdap    Site: right deltoid    Mfr: GlaxoSmithKline    Dose: 0.5 ml    Route: IM    Given by: Benny Lennert CMA (AAMA)    Exp. Date: 08/08/2012    Lot #: [id number]    VIS given: 09/06/08 version given September 18, 2010.  PPD Skin Test:    Vaccine Type: PPD    Site: left forearm    Mfr: Sanofi Pasteur    Dose: 0.1 ml    Route: ID    Given by: Benny Lennert CMA (AAMA)    Exp. Date: 08/22/2011    Lot #: [id number]  Orders Added: 1)  Tdap => 7yrs IM [90715] 2)  Admin 1st Vaccine [90471] 3)  TB Skin Test [86580] 4)  Admin of Any Addtl Vaccine [63875]

## 2010-11-21 NOTE — Assessment & Plan Note (Signed)
Summary: ?PINK EYE/CLE   Vital Signs:  Patient profile:   40 year old female Height:      63.5 inches Weight:      169.25 pounds BMI:     29.62 Temp:     98.6 degrees F oral Pulse rate:   72 / minute Pulse rhythm:   regular BP sitting:   114 / 70  (left arm) Cuff size:   regular  Vitals Entered By: Lewanda Rife LPN (August 15, 2009 10:15 AM)  CC:  ?pink eye: left eye hurts and is red.  History of Present Illness: Here for ? pink eye--onset last night with irritation, hurt--used visine and ice--no help --this morning woke with swelling and lashes stuck together--continues very tender with all movenment of eye, no visual changes --did not work yesterday, not scheduled rest of the week  Allergies (verified): No Known Drug Allergies  Review of Systems      See HPI  Physical Exam  General:  alert, well-developed, well-nourished, and well-hydrated.  NAD Eyes:  vision grossly intact, pupils equal, pupils round, and  injection of L eye only.   Neurologic:  alert & oriented X3 and gait normal.   Psych:  normally interactive and good eye contact.     Impression & Recommendations:  Problem # 1:  CONJUNCTIVITIS (ICD-372.30) Assessment New will use sodium sulamyd 10% qid x 1wk to throw away all eye makeup discussed hygeine  see back in 2d if not improved  Complete Medication List: 1)  Zoloft 100 Mg Tabs (Sertraline hcl) .Marland Kitchen.. 1 daily 2)  Wellbutrin Xl 300 Mg Tb24 (Bupropion hcl) .Marland Kitchen.. 1 once daily 3)  Doxycycline Hyclate 100 Mg Tabs (Doxycycline hyclate) .... Take one by mouth daily 4)  Sodium Sulamyd 10%  .Marland Kitchen.. 1-2 drops 4 times a day for 1 wk Prescriptions: SODIUM SULAMYD 10% 1-2 drops 4 times a day for 1 wk  #1 x 0   Entered and Authorized by:   Gildardo Griffes FNP   Signed by:   Gildardo Griffes FNP on 08/15/2009   Method used:   Print then Give to Patient   RxID:   [id number]   Current Allergies (reviewed today): No known allergies

## 2010-11-21 NOTE — Assessment & Plan Note (Signed)
Summary: EAR PAIN/HEA   Vital Signs:  Patient Profile:   40 Years Old Female Weight:      155 pounds Temp:     98.2 degrees F oral Pulse rate:   65 / minute BP sitting:   92 / 67  (left arm) Cuff size:   regular  Vitals Entered By: Cooper Render (March 26, 2007 10:26 AM)               Chief Complaint:  1)  L) EAR PAIN & TENDERNESS X'S 3 WKS 2) ? INCREASE ZOLOFT TO 150 MG.  History of Present Illness: Here due to L ear pain x 3wks, pain/tenderness behind earand into neck--constant, also shooting pain in same area off and on.  Nasal congestion, ST as usual.  Taking nothing.  No fever/chills, no cough.  No head trauma.  Wants to increase Zoloft--having weird thoughts again, constantly overwhelmed and anxious..  Better on 150mg  past mo.  Current Allergies (reviewed today): No known allergies  Updated/Current Medications (including changes made in today's visit):  ZOLOFT 100 MG TABS (SERTRALINE HCL) 2 q day MOTRIN 800 MG TABS (IBUPROFEN) 1 q8h with food      Review of Systems      See HPI   Physical Exam  General:     alert, well-developed, well-nourished, and well-hydrated.   Head:     normocephalic.   Eyes:     no injection.   Ears:     R ear normal and L ear normal.   Nose:     no external erythema.   Mouth:     pharynx pink and moist.   Lungs:     normal respiratory effort and normal breath sounds.   Heart:     normal rate, regular rhythm, and no murmur.   Msk:     Full ROM neck but pain with rotation to R and trying to put R ear onto R shoulder otherwise no pain with movement,  tender with palp over sternoclydomastoid, esp at insertion. Neurologic:     alert & oriented X3, sensation intact to light touch, and gait normal.   Skin:     turgor normal and color normal.   Psych:     Oriented X3, not anxious appearing, and not depressed appearing.      Impression & Recommendations:  Problem # 1:  CERVICAL MUSCLE STRAIN (ICD-847.0)  Her updated  medication list for this problem includes:    Motrin 800 Mg Tabs (Ibuprofen) .Marland Kitchen... 1 q8h with food   Problem # 2:  DISORDERS, OBSESSIVE-COMPULSIVE (ICD-300.3) increase Zoloft to 200mg  daily See back 47mo to follow up  Medications Added to Medication List This Visit: 1)  Zoloft 100 Mg Tabs (Sertraline hcl) .... Take 1 tablet by mouth once a day 2)  Zoloft 100 Mg Tabs (Sertraline hcl) .... 2 q day 3)  Motrin 800 Mg Tabs (Ibuprofen) .Marland Kitchen.. 1 q8h with food   Patient Instructions: 1)  Please schedule a follow-up appointment in 1 month. 2)  Ibuprofen 800 every 8 hours for several days for neck pain--with food 3)  Increase Zoloft to 200 mg daily

## 2010-11-21 NOTE — Assessment & Plan Note (Signed)
Summary: acne and wants a refereal/dlo   Vital Signs:  Patient Profile:   40 Years Old Female Weight:      171 pounds Temp:     97.7 degrees F oral Pulse rate:   63 / minute BP sitting:   100 / 69  (left arm) Cuff size:   regular  Vitals Entered By: Cooper Render (May 19, 2008 11:24 AM)                 Chief Complaint:  referral for acne, sister had a skin CA, and ck tick bite area on back.  History of Present Illness: Herer for referrall to  Derm--acne on chin, cystic at times.  Has not been on meds for.   Also wants mole check --sister had skin cancer on shoulder--has had to be revised x3. --uses sun screen sometimes....  Wants tick bite checked--removed several weeks ago, continues to be red    Current Allergies (reviewed today): No known allergies      Review of Systems      See HPI   Physical Exam  General:     alert, well-developed, well-nourished, and well-hydrated.   Skin:     pustules nad hard red isoloated lesions on chin and lower cheeks  isolated red lesion R lower back--clean, raised  isoloated black lesion R neck silghtly raised, even edges, no scaling Cervical Nodes:     no anterior cervical adenopathy and no posterior cervical adenopathy.   Psych:     normally interactive.      Impression & Recommendations:  Problem # 1:  ACNE VULGARIS (ICD-706.1) Assessment: Deteriorated having more cystic areas--wants Derm referral Orders: Dermatology Referral (Derma)   Problem # 2:  TICK BITE (ICD-E906.4) Assessment: Comment Only local irritation at site of previous tick bite--clean  follow--no other rash  Problem # 3:  NEVUS (ICD-216.9) Assessment: Unchanged needs to be evaluated by Derm and removed prn  Complete Medication List: 1)  Zoloft 100 Mg Tabs (Sertraline hcl) .Marland Kitchen.. 1 q day 2)  Wellbutrin Xl 300 Mg Tb24 (Bupropion hcl) .Marland Kitchen.. 1 qam   Patient Instructions: 1)  referral to Derm   ] Prior Medications (reviewed  today): ZOLOFT 100 MG TABS (SERTRALINE HCL) 1 q day WELLBUTRIN XL 300 MG  TB24 (BUPROPION HCL) 1 qam Current Allergies (reviewed today): No known allergies

## 2010-11-21 NOTE — Progress Notes (Signed)
Summary: med change  Phone Note Call from Patient   Caller: Patient Call For: bean Summary of Call: pt on zoloft 100mg  going through a rough time request it increased to 200 mg until she can comein and be seen in 3-4 weeks (she has been on 200 mg in past). [facility] Initial call taken by: Liane Comber,  Feb 19, 2007 10:48 AM  Follow-up for Phone Call        May try Zoloft 150mg  (11/2tabs) for one month and  BE SEEN in one month. #45/0RF Follow-up by: Shaune Leeks MD,  Feb 19, 2007 11:01 AM  Additional Follow-up for Phone Call Additional follow up Details #1::        Rx Called In, LEFT MESSAGE ON MACHINE  Additional Follow-up by: Liane Comber,  Feb 19, 2007 2:57 PM

## 2010-11-21 NOTE — Assessment & Plan Note (Signed)
Summary: 6 m f/u  dlo   Vital Signs:  Patient profile:   40 year old female Height:      63.5 inches Weight:      188 pounds BMI:     32.90 Temp:     98.5 degrees F oral Pulse rate:   60 / minute Pulse rhythm:   regular BP sitting:   100 / 60  (left arm) Cuff size:   large  Vitals Entered By: Sydell Axon (January 03, 2009 8:32 AM)  CC:  6 month follow-up.  History of Present Illness: Here for 6 mo follow up of OCD and current meds---thinks more stable, but then "has her moments". --had 2 anxiety attacks over Holidays--got anxious, lightheaded, increased heart rate, got hot--had to sit down for  ~15 min---had her children and shopping.  Felt better after eating. --has not made decission re counseling---counseling recommended by GYN recently --continues to want to sleep much of the time, is aware of wt gain--afraid to go on a diet, feels like a failure.  Allergies: No Known Drug Allergies  Past History:  Review of Systems      See HPI General:  wt gain. CV:  See HPI; Complains of chest pain or discomfort and palpitations; denies swelling of feet and swelling of hands. Resp:  Complains of shortness of breath; denies cough and wheezing. GI:  Denies nausea and vomiting. Psych:  See HPI; Complains of anxiety and depression.  Physical Exam  General:  alert, well-developed, well-nourished, well-hydrated, and overweight-appearing.  wt gain of 19 lbs in 6 mo Lungs:  normal respiratory effort, no intercostal retractions, no accessory muscle use, and normal breath sounds.   Heart:  normal rate, regular rhythm, and no murmur.     Impression & Recommendations:  Problem # 1:  DISORDERS, OBSESSIVE-COMPULSIVE (ICD-300.3) Assessment Deteriorated will continue on current meds refer to psychologist/counselor for eval and tx Orders: Psychology Referral (Psychology)  Problem # 2:  OBESITY (ICD-278.00) Assessment: Deteriorated has had wt gain past 4mo of 19lbs strongly encouraged  decreased caloric intake and increased exercise--walking 5xwk for 30 min  Complete Medication List: 1)  Zoloft 100 Mg Tabs (Sertraline hcl) .Marland Kitchen.. 1 daily 2)  Wellbutrin Xl 300 Mg Tb24 (Bupropion hcl) .Marland Kitchen.. 1 once daily 3)  Doxycycline Hyclate 100 Mg Tabs (Doxycycline hyclate) .... Take one by mouth daily  Patient Instructions: 1)  refer to Behavioral Health 2)  Please schedule a follow-up appointment in 3 months.      Current Allergies (reviewed today): No known allergies

## 2010-11-22 NOTE — Consult Note (Signed)
Summary: [facility]/Consultation Report/Dr. Norva [facility]le [facility]/Consultation Report/Dr. [facility]   Imported By: Mickle Asper 06/29/2008 10:40:03  _____________________________________________________________________  External Attachment:    Type:   Image     Comment:   External Document

## 2011-01-02 ENCOUNTER — Ambulatory Visit (INDEPENDENT_AMBULATORY_CARE_PROVIDER_SITE_OTHER): Admitting: Family Medicine

## 2011-01-02 ENCOUNTER — Encounter: Payer: Self-pay | Admitting: Family Medicine

## 2011-01-02 DIAGNOSIS — F411 Generalized anxiety disorder: Secondary | ICD-10-CM | POA: Insufficient documentation

## 2011-01-02 DIAGNOSIS — F429 Obsessive-compulsive disorder, unspecified: Secondary | ICD-10-CM

## 2011-01-02 DIAGNOSIS — S83419A Sprain of medial collateral ligament of unspecified knee, initial encounter: Secondary | ICD-10-CM | POA: Insufficient documentation

## 2011-01-07 NOTE — Assessment & Plan Note (Signed)
Summary: DISCUSS CHILDREN,LEFT KNEE PAIN/CLE  [facility]   Vital Signs:  Patient profile:   40 year old female Height:      63.5 inches Weight:      169.75 pounds BMI:     29.71 Temp:     98.7 degrees F oral Pulse rate:   80 / minute Pulse rhythm:   regular BP sitting:   90 / 60  (left arm) Cuff size:   regular  Vitals Entered By: Benny Lennert CMA Duncan Dull) (January 02, 2011 2:49 PM)  History of Present Illness: Chief complaint discuss children and left knee pain  40 year old female:  [facility]. she and her husband would like to get 2nd opinion from a different pediatric gastroenterologist regarding her son  Ladona Ridgel.  ---letter medical necessity.  she is having migraines about once a week, and I know this daughter very well, she also has some significant stress issues and anxiety, which manifest in physical ailments  Dr. Henderson Cloud doing [place].  L MCL sprain. knee injury: Several weeks ago, patient twisted her knee, while she was skiing, and continue to ski. She continues to have some medial sided pain, but this is gradually been getting better over the last few weeks.  anxiety/OCD: On Zoloft 100 mg, she's been on this for about 9 years, and done quite well. She needs refills on her medication, but she overall is doing well. She does have some continued occasional anxiety.  Allergies (verified): No Known Drug Allergies  Past History:  Past medical, surgical, family and social histories (including risk factors) reviewed, and no changes noted (except as noted below).  Past Medical History: Reviewed history from 03/28/2010 and no changes required. DISORDERS, OBSESSIVE-COMPULSIVE? Anxiety    Past Surgical History: Reviewed history from 04/19/2007 and no changes required. Tonsilectomy at age 87 Repair of a deviated septum and removal of nasal polyps   Family History: Reviewed history from 04/19/2007 and no changes required. Father: Alive, alcohol abuse Mother: Living with  fibromyalgia and depression Siblings: One sister Maternal grandmother alive with emphysema, was a smoker, and had breast ca Maternal grandfather died of coplications of multiple sclerosis Both of her paternal grandparents have died,  cause of grandfather is not known. The grandmother is felt to be old age  Social History: Reviewed history from 01/05/2008 and no changes required. Marital Status: Married Children: One daughter now 45yrs and son 2--3/09 Occupation: Dental Hygienist--part time  Review of Systems       REVIEW OF SYSTEMS  GEN: No systemic complaints, no fevers, chills, sweats, or other acute illnesses MSK: Detailed in the HPI GI: tolerating PO intake without difficulty Neuro: No numbness, parasthesias, or tingling associated. Otherwise the pertinent positives of the ROS are noted above.    Physical Exam  General:  Well-developed,well-nourished,in no acute distress; alert,appropriate and cooperative throughout examination Head:  Normocephalic and atraumatic without obvious abnormalities. No apparent alopecia or balding. Ears:  no external deformities.   Nose:  no external deformity.   Lungs:  Normal respiratory effort, chest expands symmetrically. Lungs are clear to auscultation, no crackles or wheezes. Heart:  Normal rate and regular rhythm. S1 and S2 normal without gallop, murmur, click, rub or other extra sounds. Msk:  Gait: Normal heel toe pattern ROM: WNL Effusion: neg Echymosis or edema: none Patellar tendon NT Painful PLICA: neg Patellar grind: negative Medial and lateral patellar facet loading: negative medial and lateral joint lines:NT Mcmurray's neg Flexion-pinch neg Varus and valgus stress: PAIN WITH MCL STRESS Lachman:  neg Ant and Post drawer: neg Hip abduction, IR, ER: WNL Hip flexion str: 5/5 Hip abd: 5/5 Quad: 5/5 VMO atrophy:No Hamstring concentric and eccentric: 5/5    Impression & Recommendations:  Problem # 1:  KNEE SPRAIN, MEDIAL  COLLATERAL LIGAMENT (ICD-844.1) Assessment New grade 1 MCL sprain  would cont to have modified activities, but it is improving  Problem # 2:  ANXIETY STATE, UNSPECIFIED (ICD-300.00) Assessment: Unchanged cont with current meds  Her updated medication list for this problem includes:    Zoloft 100 Mg Tabs (Sertraline hcl) .Marland Kitchen... 1 daily    Wellbutrin Xl 300 Mg Tb24 (Bupropion hcl) .Marland Kitchen... 1 once daily  Problem # 3:  DISORDERS, OBSESSIVE-COMPULSIVE (ICD-300.3)  Complete Medication List: 1)  Zoloft 100 Mg Tabs (Sertraline hcl) .Marland Kitchen.. 1 daily 2)  Wellbutrin Xl 300 Mg Tb24 (Bupropion hcl) .Marland Kitchen.. 1 once daily 3)  Ibuprofen 200 Mg Tabs (Ibuprofen) .... As needed  Patient Instructions: 1)  Dr. Darra Lis -- [facility], pediatric GI, office on [facility]. [facility].  Prescriptions: ZOLOFT 100 MG TABS (SERTRALINE HCL) 1 daily  #90 x 3   Entered by:   Linde Gillis CMA (AAMA)   Authorized by:   Hannah Beat MD   Signed by:   Linde Gillis CMA (AAMA) on 01/02/2011   Method used:   Historical   RxID:   [id number]  Rx faxed to [facility].  Linde Gillis CMA Duncan Dull)  January 02, 2011 3:42 PM   Orders Added: 1)  Est. Patient Level IV [phone]    Current Allergies (reviewed today): No known allergies

## 2011-03-07 NOTE — Discharge Summary (Signed)
NAMETHUY, ATILANO                 ACCOUNT NO.:  [id number]   MEDICAL RECORD NO.:  [id number]          PATIENT TYPE:  INP   LOCATION:  9147                          FACILITY:  [facility]   PHYSICIAN:  Gerrit Friends. Aldona Bar, M.D.   DATE OF BIRTH:  27-Jul-1971   DATE OF ADMISSION:  07/29/2005  DATE OF DISCHARGE:  07/31/2005                                 DISCHARGE SUMMARY   DISCHARGE DIAGNOSES:  1.  Term pregnancy, delivered 5 pound 12 ounces female infant, Apgars 8 and 9.  2.  Blood type O+.   PROCEDURES:  Normal spontaneous delivery, first-degree tear and repair.   SUMMARY:  This 40 year old gravida 2, para 1, presented at term in active  labor after an uncomplicated pregnancy.  She progressed well, had a subsequent normal spontaneous delivery of a  viable 5 pound 12 ounce female infant with Apgar hours of 8 and 9 over a first-  degree tear, which was repaired without difficulty.  Her postpartum course  was benign.  Her discharge hemoglobin 11.3 with a white count of 9900 and a  platelet count of 183,000.  On the morning of October 12 she was ambulating  well, tolerating a regular diet well, having normal bowel and bladder  function, and was afebrile.  Her breast-feeding was going well and she was  anxious for discharge.  Baby likewise was doing well.  Accordingly, she was  given all of her instructions and understood all instructions well.  Discharge medications include vitamins, one a day for as long she is breast-  feeding, and she was use over-the-counter Advil or Tylenol for discomfort.  Return the office will be scheduled in approximately four weeks' time.   CONDITION ON DISCHARGE:  Improved.      Gerrit Friends. Aldona Bar, M.D.  Electronically Signed     RMW/MEDQ  D:  07/31/2005  T:  07/31/2005  Job:  [id number]

## 2011-07-21 ENCOUNTER — Ambulatory Visit (INDEPENDENT_AMBULATORY_CARE_PROVIDER_SITE_OTHER): Admitting: Family Medicine

## 2011-07-21 ENCOUNTER — Encounter: Payer: Self-pay | Admitting: Family Medicine

## 2011-07-21 DIAGNOSIS — J019 Acute sinusitis, unspecified: Secondary | ICD-10-CM

## 2011-07-21 MED ORDER — AMOXICILLIN 500 MG PO CAPS: 1000.0000 mg | ORAL_CAPSULE | Freq: Two times a day (BID) | ORAL | Status: AC

## 2011-07-21 NOTE — Progress Notes (Signed)
  Subjective:    Patient ID: Amy Marks, female    DOB: 1971/02/14, 40 y.o.   MRN: [id number]  Sinusitis This is a new problem. The current episode started in the past 7 days. The problem has been gradually worsening since onset. There has been no fever (but taking motrin every 4-6 hours). The pain is moderate. Associated symptoms include congestion, ear pain, headaches, sinus pressure and a sore throat. Pertinent negatives include no chills, coughing or sneezing. (Body ache  left ear pressure  pain in right sinus, teeth hurt if bending over.) Treatments tried: mucinex cold and flu. The treatment provided mild relief.   Has history of allergy and sinus infection in past.   Review of Systems  Constitutional: Negative for chills.  HENT: Positive for ear pain, congestion, sore throat and sinus pressure. Negative for sneezing.   Respiratory: Negative for cough.   Neurological: Positive for headaches.       Objective:   Physical Exam  Constitutional: Vital signs are normal. She appears well-developed and well-nourished. She is cooperative.  Non-toxic appearance. She does not appear ill. No distress.  HENT:  Head: Normocephalic.  Right Ear: Hearing, external ear and ear canal normal. Tympanic membrane is not erythematous, not retracted and not bulging. A middle ear effusion is present.  Left Ear: Hearing, external ear and ear canal normal. Tympanic membrane is not erythematous, not retracted and not bulging. A middle ear effusion is present.  Nose: Mucosal edema and rhinorrhea present. Right sinus exhibits maxillary sinus tenderness. Right sinus exhibits no frontal sinus tenderness. Left sinus exhibits no maxillary sinus tenderness and no frontal sinus tenderness.  Mouth/Throat: Uvula is midline, oropharynx is clear and moist and mucous membranes are normal.  Eyes: Conjunctivae, EOM and lids are normal. Pupils are equal, round, and reactive to light. No foreign bodies found.  Neck: Trachea  normal and normal range of motion. Neck supple. Carotid bruit is not present. No mass and no thyromegaly present.  Cardiovascular: Normal rate, regular rhythm, S1 normal, S2 normal, normal heart sounds, intact distal pulses and normal pulses.  Exam reveals no gallop and no friction rub.   No murmur heard. Pulmonary/Chest: Effort normal and breath sounds normal. Not tachypneic. No respiratory distress. She has no decreased breath sounds. She has no wheezes. She has no rhonchi. She has no rales.  Genitourinary: Vagina normal and uterus normal.  Neurological: She is alert.  Skin: Skin is warm, dry and intact. No rash noted.  Psychiatric: Her speech is normal and behavior is normal. Judgment normal. Her mood appears not anxious. Cognition and memory are normal. She does not exhibit a depressed mood.          Assessment & Plan:

## 2011-07-21 NOTE — Assessment & Plan Note (Signed)
Eat for bacterial infection given symptoms longer than 7 days. Continue mucolytic and add nasal saline irrigation.  Follow up if not improving.

## 2011-07-21 NOTE — Patient Instructions (Signed)
Continue  Plain mucinex, start nasal saline spray 3-4 times daily to irrigate sinuses.  Start antibiotics. Call if not improving in 4-5 days.

## 2011-08-26 ENCOUNTER — Other Ambulatory Visit: Payer: Self-pay | Admitting: Obstetrics and Gynecology

## 2011-08-26 ENCOUNTER — Ambulatory Visit (INDEPENDENT_AMBULATORY_CARE_PROVIDER_SITE_OTHER)

## 2011-08-26 DIAGNOSIS — Z23 Encounter for immunization: Secondary | ICD-10-CM

## 2011-11-03 ENCOUNTER — Ambulatory Visit (INDEPENDENT_AMBULATORY_CARE_PROVIDER_SITE_OTHER): Admitting: Family Medicine

## 2011-11-03 ENCOUNTER — Encounter: Payer: Self-pay | Admitting: Family Medicine

## 2011-11-03 VITALS — BP 100/60 | HR 73 | Temp 98.4°F | Ht 63.0 in | Wt 177.0 lb

## 2011-11-03 DIAGNOSIS — R5383 Other fatigue: Secondary | ICD-10-CM

## 2011-11-03 DIAGNOSIS — R5381 Other malaise: Secondary | ICD-10-CM

## 2011-11-03 DIAGNOSIS — G5602 Carpal tunnel syndrome, left upper limb: Secondary | ICD-10-CM

## 2011-11-03 DIAGNOSIS — R202 Paresthesia of skin: Secondary | ICD-10-CM

## 2011-11-03 DIAGNOSIS — G5600 Carpal tunnel syndrome, unspecified upper limb: Secondary | ICD-10-CM

## 2011-11-03 DIAGNOSIS — R209 Unspecified disturbances of skin sensation: Secondary | ICD-10-CM

## 2011-11-03 LAB — BASIC METABOLIC PANEL
BUN: 10 mg/dL (ref 6–23)
CO2: 25 mEq/L (ref 19–32)
Calcium: 9 mg/dL (ref 8.4–10.5)
Chloride: 102 mEq/L (ref 96–112)
Creatinine, Ser: 1 mg/dL (ref 0.4–1.2)
GFR: 67.11 mL/min (ref >60.00)
Glucose, Bld: 82 mg/dL (ref 70–99)
Potassium: 4.8 mEq/L (ref 3.5–5.1)
Sodium: 135 mEq/L (ref 135–145)

## 2011-11-03 LAB — CBC WITH DIFFERENTIAL/PLATELET
Basophils Absolute: 0 10*3/uL (ref 0.0–0.1)
Basophils Relative: 0.8 % (ref 0.0–3.0)
Eosinophils Absolute: 0.1 10*3/uL (ref 0.0–0.7)
Eosinophils Relative: 1.6 % (ref 0.0–5.0)
HCT: 38.1 % (ref 36.0–46.0)
Hemoglobin: 13.1 g/dL (ref 12.0–15.0)
Lymphocytes Relative: 27.3 % (ref 12.0–46.0)
Lymphs Abs: 1.4 10*3/uL (ref 0.7–4.0)
MCHC: 34.5 g/dL (ref 30.0–36.0)
MCV: 92.1 fl (ref 78.0–100.0)
Monocytes Absolute: 0.3 10*3/uL (ref 0.1–1.0)
Monocytes Relative: 6.3 % (ref 3.0–12.0)
Neutro Abs: 3.2 10*3/uL (ref 1.4–7.7)
Neutrophils Relative %: 64 % (ref 43.0–77.0)
Platelets: 190 10*3/uL (ref 150.0–400.0)
RBC: 4.14 Mil/uL (ref 3.87–5.11)
RDW: 12.9 % (ref 11.5–14.6)
WBC: 4.9 10*3/uL (ref 4.5–10.5)

## 2011-11-03 LAB — VITAMIN B12: Vitamin B-12: 315 pg/mL (ref 211–911)

## 2011-11-03 LAB — TSH: TSH: 2.56 u[IU]/mL (ref 0.35–5.50)

## 2011-11-03 MED ORDER — SERTRALINE HCL 50 MG PO TABS: 150.0000 mg | ORAL_TABLET | Freq: Every day | ORAL | Status: DC

## 2011-11-03 NOTE — Patient Instructions (Signed)
Recheck in 1 month 

## 2011-11-03 NOTE — Progress Notes (Signed)
Patient Name: Amy Marks Date of Birth: [birth year] Age: 41 y.o. Medical Record Number: [id number] Gender: female Date of Encounter: 11/03/2011  History of Present Illness:  Amy Marks is a 41 y.o. very pleasant female patient who presents with the following:  A little more irritable. Tired all the time. Has been like that for a long time. Getting worse? Some crying and being more emotional for the last few months. Not sleeping well -- but now hand is keeping up. Always feels tired. Some days will feel so exhausted - like may fall out. Started exercising. No real si or hi.  Having some baseline numbness - at night and in the morning. Fingertips -- but whole hand.   CTS: The patent presents a 6 mo + year history of numbness and tingling, greatest in medial aspect of hands, some in ulnar aspect, L > R. No weakness with grip strength. Shome occ. pain going to forearm. Bothers the most at night. Aggravated by lifting arms above head and at night.   Splints? No Prior Injecton: none EMG: none Hand of Dominance: R   Past Medical History, Surgical History, Social History, Family History, Problem List, Medications, and Allergies have been reviewed and updated if relevant.  Review of Systems:  GEN: No acute illnesses, no fevers, chills. GI: No n/v/d, eating normally Pulm: No SOB Interactive and getting along well at home.  Otherwise, ROS is as per the HPI.   Physical Examination: Filed Vitals:   11/03/11 0750  BP: 100/60  Pulse: 73  Temp: 98.4 F (36.9 C)  TempSrc: Oral  Height: 5\' 3"  (1.6 m)  Weight: 177 lb (80.287 kg)  SpO2: 100%    Body mass index is 31.35 kg/(m^2).   GEN: WDWN, NAD, Non-toxic, Alert & Oriented x 3 HEENT: Atraumatic, Normocephalic.  Ears and Nose: No external deformity. EXTR: No clubbing/cyanosis/edema NEURO: Normal gait.  PSYCH: Normally interactive. Conversant. Not depressed or anxious appearing.  Calm demeanor.   B hand Ecchymosis or edema:  neg ROM wrist/hand/digits: full  Carpals, MCP's, digits: NT Distal Ulna and Radius: NT Ecchymosis or edema: neg No instability Cysts/nodules: neg Digit triggering: neg Snuffbox tenderness: neg Scaphoid tubercle: NT Full composite fist, no malrotation Grip, all digits: 5/5 str DIPJT: NT PIP JT: NT MCP JT: NT No tenosynovitis Axial load test: neg Phalen's: POS, L Tinel's: neg Atrophy: neg  Hand sensation: intact   Assessment and Plan: 1. Tingling in extremities  CBC with Differential, Basic metabolic panel, Vitamin B12, TSH  2. Fatigue  CBC with Differential, Basic metabolic panel, Vitamin B12, TSH  3. Carpal tunnel syndrome of left wrist      Increase zoloft to 150, recheck 1 mo  Carpal Tunnel Syndrome: We discussed the anatomy involved, and that carpal tunnel syndrome primarily involves the median nerve, and this typically affects digits one through 3. We also discussed that mild cases of carpal tunnel syndrome are often improved with night splints, and it is very reasonable to consider a carpal tunnel injection. If the patient does have moderate to severe carpal tunnel syndrome based on NCV, then it is certainly reasonable to consider carpal tunnel release, which was discussed with the patient. We also discussed his severe carpal tunnel syndrome can lead to permanent nerve impairment even if released. At this point, the patient like to proceed conservatively.  Night splint given, cock-up wrist splint CT injection  CTS Injection, LEFT Verbal consent was obtained. Risks (including rare risk of infection), benefits, and alternatives were discussed.  Prepped with Chloraprep and Ethyl Chloride used for anesthesia. Under sterile conditions,  the patient was injected just ulnar to the palmaris longus tendon at the wrist flexion crease.  The needle was inserted at 45 degree angle aiming distally. Aspiration showed no blood. Medication flowed freely without resistance.  Needle size: 22  gauge 1 1/2 inch Injection: 3/4 cc of Lidocaine 1% and 3/4 cc of Depo-Medrol 40 mg

## 2011-11-04 ENCOUNTER — Telehealth: Payer: Self-pay | Admitting: Family Medicine

## 2011-11-04 NOTE — Telephone Encounter (Signed)
rec call from patient - still having some numbness from cts inj at end of the day. Discussed that this can happen from anesthetic and volume - anesthesia from med and compression from volume. Almost always returns within the day, occaisionally can take a week or for return. Discussed that it sounds as if she has a nerve block that is taking longer to return  We also discussed that in extremely rare circumstances injury to nerve can happen, but this is rare and very unlikely

## 2011-12-01 ENCOUNTER — Encounter: Payer: Self-pay | Admitting: Family Medicine

## 2011-12-01 ENCOUNTER — Ambulatory Visit (INDEPENDENT_AMBULATORY_CARE_PROVIDER_SITE_OTHER): Admitting: Family Medicine

## 2011-12-01 DIAGNOSIS — F411 Generalized anxiety disorder: Secondary | ICD-10-CM

## 2011-12-01 DIAGNOSIS — G5603 Carpal tunnel syndrome, bilateral upper limbs: Secondary | ICD-10-CM

## 2011-12-01 DIAGNOSIS — F429 Obsessive-compulsive disorder, unspecified: Secondary | ICD-10-CM

## 2011-12-01 DIAGNOSIS — G5600 Carpal tunnel syndrome, unspecified upper limb: Secondary | ICD-10-CM

## 2011-12-01 NOTE — Progress Notes (Signed)
  Patient Name: Amy Marks Date of Birth: June 30, 1971 Age: 41 y.o. Medical Record Number: [id number] Gender: female Date of Encounter: 12/01/2011  History of Present Illness:  Amy Marks is a 41 y.o. very pleasant female patient who presents with the following:  Mood better with increased zoloft. No crying. More stable. No anxiety really  L hand better R hand better S/p CTS inj on L and night splints. Very rare symptoms only now  str excellent  Past Medical History, Surgical History, Social History, Family History, Problem List, Medications, and Allergies have been reviewed and updated if relevant.  Review of Systems:  GEN: No fevers, chills. Nontoxic. Primarily MSK c/o today. MSK: Detailed in the HPI GI: tolerating PO intake without difficulty Neuro: detailed above Otherwise the pertinent positives of the ROS are noted above.   Physical Examination: Filed Vitals:   12/01/11 0805  BP: 100/60  Pulse: 77  Temp: 98.7 F (37.1 C)  TempSrc: Oral  Height: 5\' 3"  (1.6 m)  Weight: 177 lb 12.8 oz (80.65 kg)  SpO2: 99%    Body mass index is 31.50 kg/(m^2).   GEN: WDWN, NAD, Non-toxic, Alert & Oriented x 3 HEENT: Atraumatic, Normocephalic.  Ears and Nose: No external deformity. EXTR: No clubbing/cyanosis/edema NEURO: Normal gait.  PSYCH: Normally interactive. Conversant. Not depressed or anxious appearing.  Calm demeanor.    Assessment and Plan: 1. Anxiety state, unspecified   2. DISORDERS, OBSESSIVE-COMPULSIVE   3. Carpal tunnel syndrome, bilateral     Much improved Cont conservative CTS management until sx progress.

## 2012-01-30 ENCOUNTER — Telehealth: Payer: Self-pay | Admitting: *Deleted

## 2012-01-30 NOTE — Telephone Encounter (Signed)
Pt wants copy of labs from January of this year, copy placed up front for her to pick up.

## 2012-06-17 ENCOUNTER — Other Ambulatory Visit: Payer: Self-pay

## 2012-06-17 NOTE — Telephone Encounter (Signed)
Pt request refill Sertraline for anxiety [facility].Please advise.

## 2012-06-18 MED ORDER — SERTRALINE HCL 50 MG PO TABS: 150.0000 mg | ORAL_TABLET | Freq: Every day | ORAL | Status: DC

## 2012-06-18 NOTE — Telephone Encounter (Signed)
Left v/m for pt to ck with pharmacy. 

## 2012-08-06 ENCOUNTER — Other Ambulatory Visit: Payer: Self-pay | Admitting: Obstetrics and Gynecology

## 2012-08-19 ENCOUNTER — Encounter (HOSPITAL_COMMUNITY): Payer: Self-pay | Admitting: Pharmacist

## 2012-08-24 ENCOUNTER — Other Ambulatory Visit: Payer: Self-pay | Admitting: Obstetrics and Gynecology

## 2012-08-31 ENCOUNTER — Encounter (HOSPITAL_COMMUNITY)
Admission: RE | Admit: 2012-08-31 | Discharge: 2012-08-31 | Disposition: A | Discharge status: Home / Self Care | Source: Ambulatory Visit | Attending: Obstetrics and Gynecology | Admitting: Obstetrics and Gynecology

## 2012-08-31 ENCOUNTER — Encounter (HOSPITAL_COMMUNITY): Payer: Self-pay

## 2012-08-31 LAB — SURGICAL PCR SCREEN
MRSA, PCR: NEGATIVE
Staphylococcus aureus: NEGATIVE

## 2012-08-31 LAB — CBC
HCT: 38.6 % (ref 36.0–46.0)
Hemoglobin: 13 g/dL (ref 12.0–15.0)
MCH: 30.2 pg (ref 26.0–34.0)
MCHC: 33.7 g/dL (ref 30.0–36.0)
MCV: 89.8 fL (ref 78.0–100.0)
Platelets: 196 10*3/uL (ref 150–400)
RBC: 4.3 MIL/uL (ref 3.87–5.11)
RDW: 12.1 % (ref 11.5–15.5)
WBC: 6.4 10*3/uL (ref 4.0–10.5)

## 2012-08-31 NOTE — Patient Instructions (Addendum)
   Your procedure is scheduled ZO:[id number] November 15th  Enter through the Main Entrance of [facility] at:6am Pick up the phone at the desk and dial [phone] and inform us of your arrival.  Please call this number if you have any problems the morning of surgery: [phone]  Do not eat or drink anything after midnight on Thursday You may take your wellbutrin in the morning with sips of water day of surgery.  Do not wear jewelry, make-up, or FINGER nail polish No metal in your hair or on your body. Do not wear lotions, powders, perfumes. You may wear deodorant.  Please use your CHG wash as directed prior to surgery.  Do not shave anywhere for at least 12 hours prior to first CHG shower.  Do not bring valuables to the hospital.    Patients discharged on the day of surgery will not be allowed to drive home.

## 2012-09-02 NOTE — H&P (Signed)
41 y.o. yo complains of menorrhagia with clots and dysmenorrhea.  She has no IM bleeding.  She desires a long term solution and desires sterility.  Past Medical History  Diagnosis Date  . OCD (obsessive compulsive disorder)   . Anxiety    Past Surgical History  Procedure Date  . Tonsillectomy   . Nasal septum surgery     History   Social History  . Marital Status: Married    Spouse Name: N/A    Number of Children: 2  . 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 Smoking status: Former Smoker    Quit date: 08/31/2010  . Smokeless tobacco: Not on file  . Alcohol Use: Yes     Comment: rare  . Drug Use: No  . Sexually Active: Not on file   Other Topics Concern  . Not on file   Social History Narrative  . No narrative on file    No current facility-administered medications on file prior to encounter.   Current Outpatient Prescriptions on File Prior to Encounter  Medication Sig Dispense Refill  . buPROPion (WELLBUTRIN XL) 300 MG 24 hr tablet Take 300 mg by mouth daily.        . sertraline (ZOLOFT) 50 MG tablet Take 3 tablets (150 mg total) by mouth daily.  270 tablet  1    No Known Allergies  @VITALS2 @  Lungs: clear to ascultation Cor:  RRR Abdomen:  soft, nontender, nondistended. Ex:  no cords, erythema Pelvic:  NEFG, nml s/s av uterus, no masses.  U/S  7.4x5x4; EM 8.4 mm.  LO with simple cyst, ovaries otherwise normal.  A:  For D&C, hysteroscopy, Novasure, filschie clip BTL.   P:  All risks, benefits and alternatives d/w patient and she desires to proceed. Zebulun Deman A

## 2012-09-03 ENCOUNTER — Encounter (HOSPITAL_COMMUNITY)
Admission: RE | Disposition: A | Discharge status: Home / Self Care | Payer: Self-pay | Source: Ambulatory Visit | Attending: Obstetrics and Gynecology

## 2012-09-03 ENCOUNTER — Encounter (HOSPITAL_COMMUNITY): Payer: Self-pay | Admitting: General Practice

## 2012-09-03 ENCOUNTER — Encounter (HOSPITAL_COMMUNITY): Payer: Self-pay | Admitting: Anesthesiology

## 2012-09-03 ENCOUNTER — Ambulatory Visit (HOSPITAL_COMMUNITY): Admitting: Anesthesiology

## 2012-09-03 ENCOUNTER — Ambulatory Visit (HOSPITAL_COMMUNITY)
Admission: RE | Admit: 2012-09-03 | Discharge: 2012-09-03 | Disposition: A | Discharge status: Home / Self Care | Source: Ambulatory Visit | Attending: Obstetrics and Gynecology | Admitting: Obstetrics and Gynecology

## 2012-09-03 DIAGNOSIS — Z01812 Encounter for preprocedural laboratory examination: Secondary | ICD-10-CM | POA: Insufficient documentation

## 2012-09-03 DIAGNOSIS — Z302 Encounter for sterilization: Secondary | ICD-10-CM | POA: Insufficient documentation

## 2012-09-03 DIAGNOSIS — N80109 Endometriosis of ovary, unspecified side, unspecified depth: Secondary | ICD-10-CM | POA: Insufficient documentation

## 2012-09-03 DIAGNOSIS — N946 Dysmenorrhea, unspecified: Secondary | ICD-10-CM | POA: Insufficient documentation

## 2012-09-03 DIAGNOSIS — N801 Endometriosis of ovary: Secondary | ICD-10-CM | POA: Insufficient documentation

## 2012-09-03 DIAGNOSIS — Z01818 Encounter for other preprocedural examination: Secondary | ICD-10-CM | POA: Insufficient documentation

## 2012-09-03 HISTORY — PX: DILITATION & CURRETTAGE/HYSTROSCOPY WITH NOVASURE ABLATION: SHX5568

## 2012-09-03 HISTORY — PX: LAPAROSCOPIC TUBAL LIGATION: SHX1937

## 2012-09-03 LAB — PREGNANCY, URINE: Preg Test, Ur: NEGATIVE

## 2012-09-03 SURGERY — DILATATION & CURETTAGE/HYSTEROSCOPY WITH NOVASURE ABLATION
Anesthesia: General | Site: Vagina | Wound class: Clean Contaminated

## 2012-09-03 MED ORDER — FENTANYL CITRATE 0.05 MG/ML IJ SOLN
INTRAMUSCULAR | Status: DC | PRN
  Administered 2012-09-03 (×4): 50 ug via INTRAVENOUS

## 2012-09-03 MED ORDER — DEXAMETHASONE SODIUM PHOSPHATE 4 MG/ML IJ SOLN
INTRAMUSCULAR | Status: DC | PRN
  Administered 2012-09-03: 10 mg via INTRAVENOUS

## 2012-09-03 MED ORDER — OXYCODONE-ACETAMINOPHEN 5-325 MG PO TABS: 1.0000 | ORAL_TABLET | ORAL | Status: DC | PRN

## 2012-09-03 MED ORDER — FENTANYL CITRATE 0.05 MG/ML IJ SOLN
INTRAMUSCULAR | Status: AC
  Filled 2012-09-03: qty 5

## 2012-09-03 MED ORDER — MIDAZOLAM HCL 2 MG/2ML IJ SOLN
INTRAMUSCULAR | Status: AC
  Filled 2012-09-03: qty 2

## 2012-09-03 MED ORDER — ROCURONIUM BROMIDE 100 MG/10ML IV SOLN
INTRAVENOUS | Status: DC | PRN
  Administered 2012-09-03: 10 mg via INTRAVENOUS
  Administered 2012-09-03: 5 mg via INTRAVENOUS
  Administered 2012-09-03: 35 mg via INTRAVENOUS

## 2012-09-03 MED ORDER — GLYCOPYRROLATE 0.2 MG/ML IJ SOLN
INTRAMUSCULAR | Status: AC
  Filled 2012-09-03: qty 2

## 2012-09-03 MED ORDER — MIDAZOLAM HCL 5 MG/5ML IJ SOLN
INTRAMUSCULAR | Status: DC | PRN
  Administered 2012-09-03: 2 mg via INTRAVENOUS

## 2012-09-03 MED ORDER — ONDANSETRON HCL 4 MG/2ML IJ SOLN
INTRAMUSCULAR | Status: DC | PRN
  Administered 2012-09-03: 4 mg via INTRAVENOUS

## 2012-09-03 MED ORDER — DEXAMETHASONE SODIUM PHOSPHATE 10 MG/ML IJ SOLN
INTRAMUSCULAR | Status: AC
  Filled 2012-09-03: qty 1

## 2012-09-03 MED ORDER — HYDROCODONE-ACETAMINOPHEN 5-325 MG PO TABS
1.0000 | ORAL_TABLET | Freq: Once | ORAL | Status: AC
  Administered 2012-09-03: 1 via ORAL

## 2012-09-03 MED ORDER — NEOSTIGMINE METHYLSULFATE 1 MG/ML IJ SOLN
INTRAMUSCULAR | Status: DC | PRN
  Administered 2012-09-03: 2 mg via INTRAVENOUS

## 2012-09-03 MED ORDER — GLYCOPYRROLATE 0.2 MG/ML IJ SOLN
INTRAMUSCULAR | Status: DC | PRN
  Administered 2012-09-03: 0.4 mg via INTRAVENOUS

## 2012-09-03 MED ORDER — FENTANYL CITRATE 0.05 MG/ML IJ SOLN: 25.0000 ug | INTRAMUSCULAR | Status: DC | PRN

## 2012-09-03 MED ORDER — KETOROLAC TROMETHAMINE 30 MG/ML IJ SOLN
INTRAMUSCULAR | Status: DC | PRN
  Administered 2012-09-03: 60 mg via INTRAVENOUS

## 2012-09-03 MED ORDER — ATROPINE SULFATE 0.4 MG/ML IJ SOLN
INTRAMUSCULAR | Status: DC | PRN
  Administered 2012-09-03: 0.4 mg via INTRAVENOUS

## 2012-09-03 MED ORDER — PROPOFOL 10 MG/ML IV EMUL
INTRAVENOUS | Status: AC
  Filled 2012-09-03: qty 20

## 2012-09-03 MED ORDER — KETOROLAC TROMETHAMINE 30 MG/ML IJ SOLN
INTRAMUSCULAR | Status: AC
  Filled 2012-09-03: qty 2

## 2012-09-03 MED ORDER — ROCURONIUM BROMIDE 50 MG/5ML IV SOLN
INTRAVENOUS | Status: AC
  Filled 2012-09-03: qty 1

## 2012-09-03 MED ORDER — LIDOCAINE HCL (CARDIAC) 20 MG/ML IV SOLN
INTRAVENOUS | Status: AC
  Filled 2012-09-03: qty 5

## 2012-09-03 MED ORDER — PROPOFOL 10 MG/ML IV EMUL
INTRAVENOUS | Status: DC | PRN
  Administered 2012-09-03: 150 mg via INTRAVENOUS

## 2012-09-03 MED ORDER — LIDOCAINE HCL (CARDIAC) 20 MG/ML IV SOLN
INTRAVENOUS | Status: DC | PRN
  Administered 2012-09-03: 70 mg via INTRAVENOUS

## 2012-09-03 MED ORDER — LACTATED RINGERS IV SOLN
INTRAVENOUS | Status: DC | PRN
  Administered 2012-09-03 (×2): via INTRAVENOUS

## 2012-09-03 MED ORDER — NEOSTIGMINE METHYLSULFATE 1 MG/ML IJ SOLN
INTRAMUSCULAR | Status: AC
  Filled 2012-09-03: qty 10

## 2012-09-03 MED ORDER — ATROPINE SULFATE 0.4 MG/ML IJ SOLN
INTRAMUSCULAR | Status: AC
  Filled 2012-09-03: qty 1

## 2012-09-03 MED ORDER — LACTATED RINGERS IR SOLN
Status: DC | PRN
  Administered 2012-09-03: 3000 mL

## 2012-09-03 MED ORDER — ONDANSETRON HCL 4 MG/2ML IJ SOLN
INTRAMUSCULAR | Status: AC
  Filled 2012-09-03: qty 2

## 2012-09-03 MED ORDER — HYDROCODONE-ACETAMINOPHEN 5-325 MG PO TABS
ORAL_TABLET | ORAL | Status: AC
  Administered 2012-09-03: 1 via ORAL
  Filled 2012-09-03: qty 1

## 2012-09-03 SURGICAL SUPPLY — 29 items
ABLATOR ENDOMETRIAL BIPOLAR (ABLATOR) ×3 IMPLANT
ADH SKN CLS APL DERMABOND .7 (GAUZE/BANDAGES/DRESSINGS) ×2
CANISTER SUCTION 2500CC (MISCELLANEOUS) ×3 IMPLANT
CATH ROBINSON RED A/P 16FR (CATHETERS) ×3 IMPLANT
CHLORAPREP W/TINT 26ML (MISCELLANEOUS) ×3 IMPLANT
CLIP FILSHIE TUBAL LIGA STRL (Clip) ×5 IMPLANT
CLOTH BEACON ORANGE TIMEOUT ST (SAFETY) ×3 IMPLANT
CONTAINER PREFILL 10% NBF 60ML (FORM) ×5 IMPLANT
DERMABOND ADVANCED (GAUZE/BANDAGES/DRESSINGS) ×1
DERMABOND ADVANCED .7 DNX12 (GAUZE/BANDAGES/DRESSINGS) ×2 IMPLANT
DRESSING TELFA 8X3 (GAUZE/BANDAGES/DRESSINGS) ×3 IMPLANT
ELECT REM PT RETURN 9FT ADLT (ELECTROSURGICAL)
ELECTRODE REM PT RTRN 9FT ADLT (ELECTROSURGICAL) IMPLANT
GLOVE BIO SURGEON STRL SZ7 (GLOVE) ×7 IMPLANT
GOWN PREVENTION PLUS LG XLONG (DISPOSABLE) ×6 IMPLANT
GOWN STRL REIN XL XLG (GOWN DISPOSABLE) ×9 IMPLANT
LOOP ANGLED CUTTING 22FR (CUTTING LOOP) IMPLANT
NDL INSUFFLATION 14GA 120MM (NEEDLE) ×2 IMPLANT
NEEDLE INSUFFLATION 14GA 120MM (NEEDLE) ×3 IMPLANT
PACK HYSTEROSCOPY LF (CUSTOM PROCEDURE TRAY) ×3 IMPLANT
PACK LAPAROSCOPY BASIN (CUSTOM PROCEDURE TRAY) ×3 IMPLANT
PAD OB MATERNITY 4.3X12.25 (PERSONAL CARE ITEMS) ×3 IMPLANT
SUT VIC AB 2-0 UR5 27 (SUTURE) ×3 IMPLANT
SUT VICRYL RAPIDE 3 0 (SUTURE) IMPLANT
TOWEL OR 17X24 6PK STRL BLUE (TOWEL DISPOSABLE) ×6 IMPLANT
TROCAR Z-THREAD BLADED 11X100M (TROCAR) ×3 IMPLANT
TROCAR Z-THREAD FIOS 5X100MM (TROCAR) IMPLANT
WARMER LAPAROSCOPE (MISCELLANEOUS) ×3 IMPLANT
WATER STERILE IRR 1000ML POUR (IV SOLUTION) ×3 IMPLANT

## 2012-09-03 NOTE — Anesthesia Postprocedure Evaluation (Signed)
Anesthesia Post Note  Patient: Amy Marks  Procedure(s) Performed: Procedure(s) (LRB): DILATATION & CURETTAGE/HYSTEROSCOPY WITH NOVASURE ABLATION (N/A) LAPAROSCOPIC TUBAL LIGATION (Bilateral)  Anesthesia type: General  Patient location: PACU  Post pain: Pain level controlled  Post assessment: Post-op Vital signs reviewed  Last Vitals:  Filed Vitals:   09/03/12 1130  BP: 106/65  Pulse: 64  Temp: 36.8 C  Resp: 16    Post vital signs: Reviewed  Level of consciousness: sedated  Complications: No apparent anesthesia complicationsfj

## 2012-09-03 NOTE — Progress Notes (Signed)
There has been no change in the patients history, status or exam since the history and physical.  Filed Vitals:   08/10/12 0849 09/03/12 0601  BP:  112/74  Pulse:  71  Temp:  97.5 F (36.4 C)  TempSrc:  Oral  Resp:  16  Height: 5\' 3"  (1.6 m)   Weight: 79.379 kg (175 lb)   SpO2:  96%    Lab Results  Component Value Date   WBC 6.4 08/31/2012   HGB 13.0 08/31/2012   HCT 38.6 08/31/2012   MCV 89.8 08/31/2012   PLT 196 08/31/2012    Kjersti Dittmer A

## 2012-09-03 NOTE — Anesthesia Preprocedure Evaluation (Signed)

## 2012-09-03 NOTE — Transfer of Care (Signed)
Immediate Anesthesia Transfer of Care Note  Patient: Amy Marks  Procedure(s) Performed: Procedure(s) (LRB) with comments: DILATATION & CURETTAGE/HYSTEROSCOPY WITH NOVASURE ABLATION (N/A) LAPAROSCOPIC TUBAL LIGATION (Bilateral) - FILSHIE CLIPS  Patient Location: PACU  Anesthesia Type:General  Level of Consciousness: awake, alert , oriented and patient cooperative  Airway & Oxygen Therapy: Patient Spontanous Breathing and Patient connected to nasal cannula oxygen  Post-op Assessment: Report given to PACU RN and Post -op Vital signs reviewed and stable  Post vital signs: Reviewed and stable  Complications: No apparent anesthesia complications

## 2012-09-03 NOTE — Brief Op Note (Signed)
09/03/2012  11:13 AM  PATIENT:  Amy Marks  41 y.o. female  PRE-OPERATIVE DIAGNOSIS:  DYSMENORRHEA DESIRES STERILIZATION  POST-OPERATIVE DIAGNOSIS:  DYSMENORRHEA DESIRES STERILIZATION  PROCEDURE:  Procedure(s) (LRB) with comments: DILATATION & CURETTAGE/HYSTEROSCOPY WITH NOVASURE ABLATION (N/A) LAPAROSCOPIC TUBAL LIGATION (Bilateral) - FILSHIE CLIPS  SURGEON:  Surgeon(s) and Role:    * Oluwatomisin Deman A Haelee Bolen, MD - Primary   ANESTHESIA:   general  EBL:  Total I/O In: 1100 [I.V.:1100] Out: 105 [Urine:100; Blood:5]   SPECIMEN:  Source of Specimen:  uterine curettings  DISPOSITION OF SPECIMEN:  PATHOLOGY  COUNTS:  YES  TOURNIQUET:  * No tourniquets in log *  DICTATION: .Note written in EPIC  PLAN OF CARE: Discharge to home after PACU  PATIENT DISPOSITION:  PACU - hemodynamically stable.   Delay start of Pharmacological VTE agent (>24hrs) due to surgical blood loss or risk of bleeding: not applicable  Meds: none  Complications:  none  Findings: 8 weeks size uterus with shaggy endometrium.  Normal tubes seen.  Some e/o chocolate cyst fluid in cul de sac associated with endometriosis but the L ovary was only enlarged with a benign looking cyst.  The deficit was 135  Cc.  Technique  After adequate general endotracheal anesthesia was achieved, the patient was prepped and draped in the usual sterile fashion.  A 2 cm incision was made just below the umbilicus and the abdominal wall tented up.  The Veress needle was inserted at a 45 degree angle to the pelvis and no bowel contents or blood were aspirated.  The abdomen was insufflated and the 12 mm trocar placed without complication.  The operative scope was introduced and the above findings noted.  The filchie clip instrument was introduced and the tube were followed to their fimbriated ends bilaterally.  A filchie clip was placed on the isthmic portion of each tube and confirmed visually to be around the entire circumference of  each tube.  After a quick inspection of the pelvis and liver edge, all instruments were removed and the abdomen was desufflated.   Attention then turned to the vagina and the speculum was placed in the vagina and the cervix stabilized with a single-tooth tenaculum.  The cervix was dilated with pratt dilators and the hysteroscope passed inside the endometrial cavity.  The above findings were noted and sharp curettage was then performed and uterine curettings sent to path.  The cavity length was 5.5 cm and the Novasure instrument successfully seated.  The width was 3.5 cm and the CO2 test passed 2 x.  The ablation went for 70 sec at at power of 106 watts.  Once the ablation was completed, the Novasure instrument was removed and the hysteroscope passed into the cavity again.  Good contact was seen in all areas except for a small area in the fundus on the R.   All instruments were then removed from the uterus and vagina.    Gloves were changed and ehe 12 mm faschial incision was closed with a figure of eight stitch of 2-vicryl and the skin was closed with dermabond.  All instruments were removed from the vagina and the patient returned to the PACU in stable condition.  Pheobe Sandiford A   

## 2012-09-03 NOTE — Preoperative (Signed)
Beta Blockers   Reason not to administer Beta Blockers:Not Applicable 

## 2012-09-03 NOTE — Op Note (Signed)
09/03/2012  11:13 AM  PATIENT:  Amy Marks  41 y.o. female  PRE-OPERATIVE DIAGNOSIS:  DYSMENORRHEA DESIRES STERILIZATION  POST-OPERATIVE DIAGNOSIS:  DYSMENORRHEA DESIRES STERILIZATION  PROCEDURE:  Procedure(s) (LRB) with comments: DILATATION & CURETTAGE/HYSTEROSCOPY WITH NOVASURE ABLATION (N/A) LAPAROSCOPIC TUBAL LIGATION (Bilateral) - FILSHIE CLIPS  SURGEON:  Surgeon(s) and Role:    * Loney Laurence, MD - Primary   ANESTHESIA:   general  EBL:  Total I/O In: 1100 [I.V.:1100] Out: 105 [Urine:100; Blood:5]   SPECIMEN:  Source of Specimen:  uterine curettings  DISPOSITION OF SPECIMEN:  PATHOLOGY  COUNTS:  YES  TOURNIQUET:  * No tourniquets in log *  DICTATION: .Note written in EPIC  PLAN OF CARE: Discharge to home after PACU  PATIENT DISPOSITION:  PACU - hemodynamically stable.   Delay start of Pharmacological VTE agent (>24hrs) due to surgical blood loss or risk of bleeding: not applicable  Meds: none  Complications:  none  Findings: 8 weeks size uterus with shaggy endometrium.  Normal tubes seen.  Some e/o chocolate cyst fluid in cul de sac associated with endometriosis but the L ovary was only enlarged with a benign looking cyst.  The deficit was 135  Cc.  Technique  After adequate general endotracheal anesthesia was achieved, the patient was prepped and draped in the usual sterile fashion.  A 2 cm incision was made just below the umbilicus and the abdominal wall tented up.  The Veress needle was inserted at a 45 degree angle to the pelvis and no bowel contents or blood were aspirated.  The abdomen was insufflated and the 12 mm trocar placed without complication.  The operative scope was introduced and the above findings noted.  The filchie clip instrument was introduced and the tube were followed to their fimbriated ends bilaterally.  A filchie clip was placed on the isthmic portion of each tube and confirmed visually to be around the entire circumference of  each tube.  After a quick inspection of the pelvis and liver edge, all instruments were removed and the abdomen was desufflated.   Attention then turned to the vagina and the speculum was placed in the vagina and the cervix stabilized with a single-tooth tenaculum.  The cervix was dilated with pratt dilators and the hysteroscope passed inside the endometrial cavity.  The above findings were noted and sharp curettage was then performed and uterine curettings sent to path.  The cavity length was 5.5 cm and the Novasure instrument successfully seated.  The width was 3.5 cm and the CO2 test passed 2 x.  The ablation went for 70 sec at at power of 106 watts.  Once the ablation was completed, the Novasure instrument was removed and the hysteroscope passed into the cavity again.  Good contact was seen in all areas except for a small area in the fundus on the R.   All instruments were then removed from the uterus and vagina.    Gloves were changed and ehe 12 mm faschial incision was closed with a figure of eight stitch of 2-vicryl and the skin was closed with dermabond.  All instruments were removed from the vagina and the patient returned to the PACU in stable condition.  Kennth Vanbenschoten A

## 2012-09-06 ENCOUNTER — Encounter (HOSPITAL_COMMUNITY): Payer: Self-pay | Admitting: Obstetrics and Gynecology

## 2012-10-14 ENCOUNTER — Encounter (HOSPITAL_COMMUNITY): Payer: Self-pay | Admitting: *Deleted

## 2012-10-14 ENCOUNTER — Encounter (HOSPITAL_COMMUNITY)
Admission: AD | Disposition: A | Discharge status: Home / Self Care | Payer: Self-pay | Source: Ambulatory Visit | Attending: Obstetrics and Gynecology

## 2012-10-14 ENCOUNTER — Inpatient Hospital Stay (HOSPITAL_COMMUNITY)
Admission: AD | Admit: 2012-10-14 | Discharge: 2012-10-14 | Disposition: A | Discharge status: Home / Self Care | Source: Ambulatory Visit | Attending: Obstetrics and Gynecology | Admitting: Obstetrics and Gynecology

## 2012-10-14 ENCOUNTER — Inpatient Hospital Stay (HOSPITAL_COMMUNITY): Admitting: Anesthesiology

## 2012-10-14 ENCOUNTER — Encounter (HOSPITAL_COMMUNITY): Payer: Self-pay | Admitting: Anesthesiology

## 2012-10-14 ENCOUNTER — Ambulatory Visit: Admit: 2012-10-14 | Payer: Self-pay | Admitting: Obstetrics and Gynecology

## 2012-10-14 DIAGNOSIS — N949 Unspecified condition associated with female genital organs and menstrual cycle: Secondary | ICD-10-CM | POA: Insufficient documentation

## 2012-10-14 DIAGNOSIS — N831 Corpus luteum cyst of ovary, unspecified side: Secondary | ICD-10-CM | POA: Insufficient documentation

## 2012-10-14 DIAGNOSIS — N83209 Unspecified ovarian cyst, unspecified side: Secondary | ICD-10-CM

## 2012-10-14 DIAGNOSIS — N80109 Endometriosis of ovary, unspecified side, unspecified depth: Secondary | ICD-10-CM | POA: Insufficient documentation

## 2012-10-14 DIAGNOSIS — N801 Endometriosis of ovary: Secondary | ICD-10-CM | POA: Insufficient documentation

## 2012-10-14 DIAGNOSIS — N938 Other specified abnormal uterine and vaginal bleeding: Secondary | ICD-10-CM | POA: Insufficient documentation

## 2012-10-14 HISTORY — PX: OOPHORECTOMY: SHX86

## 2012-10-14 HISTORY — PX: LAPAROTOMY: SHX154

## 2012-10-14 HISTORY — PX: LAPAROSCOPY: SHX197

## 2012-10-14 LAB — CBC WITH DIFFERENTIAL/PLATELET
Basophils Absolute: 0 10*3/uL (ref 0.0–0.1)
Basophils Relative: 0 % (ref 0–1)
Eosinophils Absolute: 0 10*3/uL (ref 0.0–0.7)
Eosinophils Relative: 0 % (ref 0–5)
HCT: 37 % (ref 36.0–46.0)
Hemoglobin: 12.4 g/dL (ref 12.0–15.0)
Lymphocytes Relative: 6 % — ABNORMAL LOW (ref 12–46)
Lymphs Abs: 0.8 10*3/uL (ref 0.7–4.0)
MCH: 30.2 pg (ref 26.0–34.0)
MCHC: 33.5 g/dL (ref 30.0–36.0)
MCV: 90 fL (ref 78.0–100.0)
Monocytes Absolute: 0.9 10*3/uL (ref 0.1–1.0)
Monocytes Relative: 7 % (ref 3–12)
Neutro Abs: 11.7 10*3/uL — ABNORMAL HIGH (ref 1.7–7.7)
Neutrophils Relative %: 87 % — ABNORMAL HIGH (ref 43–77)
Platelets: 164 10*3/uL (ref 150–400)
RBC: 4.11 MIL/uL (ref 3.87–5.11)
RDW: 12.4 % (ref 11.5–15.5)
WBC: 13.4 10*3/uL — ABNORMAL HIGH (ref 4.0–10.5)

## 2012-10-14 LAB — COMPREHENSIVE METABOLIC PANEL
ALT: 41 U/L — ABNORMAL HIGH (ref 0–35)
AST: 39 U/L — ABNORMAL HIGH (ref 0–37)
Albumin: 3.8 g/dL (ref 3.5–5.2)
Alkaline Phosphatase: 67 U/L (ref 39–117)
BUN: 14 mg/dL (ref 6–23)
CO2: 23 mEq/L (ref 19–32)
Calcium: 9.1 mg/dL (ref 8.4–10.5)
Chloride: 103 mEq/L (ref 96–112)
Creatinine, Ser: 0.7 mg/dL (ref 0.50–1.10)
GFR calc Af Amer: >90 mL/min (ref >90)
GFR calc non Af Amer: >90 mL/min (ref >90)
Glucose, Bld: 95 mg/dL (ref 70–99)
Potassium: 3.9 mEq/L (ref 3.5–5.1)
Sodium: 136 mEq/L (ref 135–145)
Total Bilirubin: 1 mg/dL (ref 0.3–1.2)
Total Protein: 6.6 g/dL (ref 6.0–8.3)

## 2012-10-14 SURGERY — LAPAROSCOPY OPERATIVE
Anesthesia: General | Site: Abdomen | Wound class: Clean Contaminated

## 2012-10-14 MED ORDER — CEFAZOLIN SODIUM-DEXTROSE 2-3 GM-% IV SOLR
2.0000 g | INTRAVENOUS | Status: AC
  Administered 2012-10-14: 2 g via INTRAVENOUS
  Filled 2012-10-14: qty 50

## 2012-10-14 MED ORDER — FENTANYL CITRATE 0.05 MG/ML IJ SOLN
25.0000 ug | INTRAMUSCULAR | Status: DC | PRN
  Administered 2012-10-14: 25 ug via INTRAVENOUS

## 2012-10-14 MED ORDER — ROCURONIUM BROMIDE 100 MG/10ML IV SOLN
INTRAVENOUS | Status: DC | PRN
  Administered 2012-10-14: 50 mg via INTRAVENOUS

## 2012-10-14 MED ORDER — DEXAMETHASONE SODIUM PHOSPHATE 4 MG/ML IJ SOLN
INTRAMUSCULAR | Status: DC | PRN
  Administered 2012-10-14: 10 mg via INTRAVENOUS

## 2012-10-14 MED ORDER — LACTATED RINGERS IV SOLN
INTRAVENOUS | Status: DC
  Administered 2012-10-14 (×3): via INTRAVENOUS

## 2012-10-14 MED ORDER — OXYCODONE-ACETAMINOPHEN 5-325 MG PO TABS: 2.0000 | ORAL_TABLET | ORAL | Status: DC | PRN

## 2012-10-14 MED ORDER — ONDANSETRON HCL 4 MG/2ML IJ SOLN: INTRAMUSCULAR | Status: DC | PRN

## 2012-10-14 MED ORDER — IBUPROFEN 600 MG PO TABS: 600.0000 mg | ORAL_TABLET | Freq: Four times a day (QID) | ORAL | Status: DC | PRN

## 2012-10-14 MED ORDER — KETOROLAC TROMETHAMINE 30 MG/ML IJ SOLN
INTRAMUSCULAR | Status: DC | PRN
  Administered 2012-10-14: 30 mg via INTRAVENOUS

## 2012-10-14 MED ORDER — PROMETHAZINE HCL 25 MG/ML IJ SOLN: 6.2500 mg | INTRAMUSCULAR | Status: DC | PRN

## 2012-10-14 MED ORDER — ONDANSETRON HCL 4 MG/2ML IJ SOLN
INTRAMUSCULAR | Status: DC | PRN
  Administered 2012-10-14: 4 mg via INTRAVENOUS

## 2012-10-14 MED ORDER — FAMOTIDINE IN NACL 20-0.9 MG/50ML-% IV SOLN
20.0000 mg | Freq: Once | INTRAVENOUS | Status: AC
  Administered 2012-10-14: 20 mg via INTRAVENOUS
  Filled 2012-10-14: qty 50

## 2012-10-14 MED ORDER — PROPOFOL 10 MG/ML IV EMUL
INTRAVENOUS | Status: DC | PRN
  Administered 2012-10-14: 200 mg via INTRAVENOUS

## 2012-10-14 MED ORDER — DOCUSATE SODIUM 100 MG PO CAPS: 100.0000 mg | ORAL_CAPSULE | Freq: Two times a day (BID) | ORAL | Status: DC

## 2012-10-14 MED ORDER — LIDOCAINE-EPINEPHRINE (PF) 1 %-1:200000 IJ SOLN
INTRAMUSCULAR | Status: DC | PRN
  Administered 2012-10-14: 6 mL

## 2012-10-14 MED ORDER — KETOROLAC TROMETHAMINE 30 MG/ML IJ SOLN: 15.0000 mg | Freq: Once | INTRAMUSCULAR | Status: DC | PRN

## 2012-10-14 MED ORDER — HYDROMORPHONE HCL PF 1 MG/ML IJ SOLN
1.0000 mg | Freq: Once | INTRAMUSCULAR | Status: AC
  Administered 2012-10-14: 1 mg via INTRAVENOUS
  Filled 2012-10-14: qty 1

## 2012-10-14 MED ORDER — OXYCODONE-ACETAMINOPHEN 5-325 MG PO TABS
1.0000 | ORAL_TABLET | ORAL | Status: DC | PRN
  Administered 2012-10-14: 1 via ORAL

## 2012-10-14 MED ORDER — MEPERIDINE HCL 25 MG/ML IJ SOLN: 6.2500 mg | INTRAMUSCULAR | Status: DC | PRN

## 2012-10-14 MED ORDER — MIDAZOLAM HCL 5 MG/5ML IJ SOLN
INTRAMUSCULAR | Status: DC | PRN
  Administered 2012-10-14: 2 mg via INTRAVENOUS

## 2012-10-14 MED ORDER — LIDOCAINE HCL (CARDIAC) 20 MG/ML IV SOLN
INTRAVENOUS | Status: DC | PRN
  Administered 2012-10-14: 50 mg via INTRAVENOUS

## 2012-10-14 MED ORDER — FENTANYL CITRATE 0.05 MG/ML IJ SOLN
INTRAMUSCULAR | Status: DC | PRN
  Administered 2012-10-14 (×2): 50 ug via INTRAVENOUS
  Administered 2012-10-14: 100 ug via INTRAVENOUS
  Administered 2012-10-14: 50 ug via INTRAVENOUS
  Administered 2012-10-14: 100 ug via INTRAVENOUS

## 2012-10-14 MED ORDER — ONDANSETRON HCL 4 MG/2ML IJ SOLN
4.0000 mg | Freq: Once | INTRAMUSCULAR | Status: AC
  Administered 2012-10-14: 4 mg via INTRAVENOUS
  Filled 2012-10-14: qty 2

## 2012-10-14 SURGICAL SUPPLY — 49 items
ADH SKN CLS APL DERMABOND .7 (GAUZE/BANDAGES/DRESSINGS) ×1
BAG SPEC RTRVL LRG 6X4 10 (ENDOMECHANICALS) ×1
CANISTER SUCTION 1200CC (MISCELLANEOUS) ×1 IMPLANT
CANISTER SUCTION 2500CC (MISCELLANEOUS) ×2 IMPLANT
CLOTH BEACON ORANGE TIMEOUT ST (SAFETY) ×2 IMPLANT
CONT PATH 16OZ SNAP LID 3702 (MISCELLANEOUS) ×2 IMPLANT
DECANTER SPIKE VIAL GLASS SM (MISCELLANEOUS) ×1 IMPLANT
DERMABOND ADVANCED (GAUZE/BANDAGES/DRESSINGS) ×1
DERMABOND ADVANCED .7 DNX12 (GAUZE/BANDAGES/DRESSINGS) IMPLANT
FORCEPS CUTTING 33CM 5MM (CUTTING FORCEPS) ×1 IMPLANT
FORCEPS CUTTING 45CM 5MM (CUTTING FORCEPS) IMPLANT
GLOVE BIO SURGEON STRL SZ7 (GLOVE) ×4 IMPLANT
GOWN PREVENTION PLUS LG XLONG (DISPOSABLE) ×6 IMPLANT
IV LACTATED RINGER IRRG 3000ML (IV SOLUTION) ×2
IV LR IRRIG 3000ML ARTHROMATIC (IV SOLUTION) IMPLANT
NDL HYPO 25X1 1.5 SAFETY (NEEDLE) IMPLANT
NDL SPNL 22GX3.5 QUINCKE BK (NEEDLE) IMPLANT
NEEDLE HYPO 22GX1.5 SAFETY (NEEDLE) IMPLANT
NEEDLE HYPO 25X1 1.5 SAFETY (NEEDLE) ×2 IMPLANT
NEEDLE SPNL 22GX3.5 QUINCKE BK (NEEDLE) IMPLANT
NS IRRIG 1000ML POUR BTL (IV SOLUTION) ×2 IMPLANT
PACK ABDOMINAL GYN (CUSTOM PROCEDURE TRAY) ×2 IMPLANT
PACK LAPAROSCOPY BASIN (CUSTOM PROCEDURE TRAY) ×2 IMPLANT
PAD OB MATERNITY 4.3X12.25 (PERSONAL CARE ITEMS) ×2 IMPLANT
POUCH SPECIMEN RETRIEVAL 10MM (ENDOMECHANICALS) ×1 IMPLANT
PROTECTOR NERVE ULNAR (MISCELLANEOUS) ×2 IMPLANT
SET IRRIG TUBING LAPAROSCOPIC (IRRIGATION / IRRIGATOR) ×1 IMPLANT
SLEEVE SCD COMPRESS KNEE MED (MISCELLANEOUS) ×1 IMPLANT
SPONGE LAP 18X18 X RAY DECT (DISPOSABLE) ×4 IMPLANT
STAPLER VISISTAT 35W (STAPLE) ×2 IMPLANT
SUT PDS AB 0 CTX 60 (SUTURE) ×4 IMPLANT
SUT PLAIN 2 0 (SUTURE)
SUT PLAIN 2 0 XLH (SUTURE) IMPLANT
SUT PLAIN ABS 2-0 54XMFL TIE (SUTURE) IMPLANT
SUT VIC AB 0 CT1 18XCR BRD8 (SUTURE) IMPLANT
SUT VIC AB 0 CT1 8-18 (SUTURE)
SUT VIC AB 2-0 CT1 (SUTURE) ×2 IMPLANT
SUT VIC AB 3-0 PS2 18 (SUTURE) ×2
SUT VIC AB 3-0 PS2 18XBRD (SUTURE) ×1 IMPLANT
SUT VICRYL 0 TIES 12 18 (SUTURE) IMPLANT
SUT VICRYL 0 UR6 27IN ABS (SUTURE) ×4 IMPLANT
SYR CONTROL 10ML LL (SYRINGE) IMPLANT
TOWEL OR 17X24 6PK STRL BLUE (TOWEL DISPOSABLE) ×4 IMPLANT
TRAY FOLEY CATH 14FR (SET/KITS/TRAYS/PACK) ×3 IMPLANT
TROCAR BALLN 12MMX100 BLUNT (TROCAR) ×2 IMPLANT
TROCAR Z-THREAD BLADED 5X100MM (TROCAR) ×4 IMPLANT
TROCAR Z-THREAD FIOS 11X100 BL (TROCAR) ×1 IMPLANT
WARMER LAPAROSCOPE (MISCELLANEOUS) ×2 IMPLANT
WATER STERILE IRR 1000ML POUR (IV SOLUTION) ×4 IMPLANT

## 2012-10-14 NOTE — Progress Notes (Signed)
[facility] notified that patient is ready for surgery.

## 2012-10-14 NOTE — Anesthesia Postprocedure Evaluation (Signed)
Anesthesia Post Note  Patient: Amy Marks  Procedure(s) Performed: Procedure(s) (LRB): LAPAROSCOPY OPERATIVE (N/A) EXPLORATORY LAPAROTOMY (N/A)  Anesthesia type: General  Patient location: PACU  Post pain: Pain level controlled  Post assessment: Post-op Vital signs reviewed  Last Vitals:  Filed Vitals:   10/14/12 1900  BP:   Pulse: 80  Temp:   Resp: 24    Post vital signs: Reviewed  Level of consciousness: sedated  Complications: No apparent anesthesia complicationsfj

## 2012-10-14 NOTE — Anesthesia Preprocedure Evaluation (Signed)
Anesthesia Evaluation  Patient identified by MRN, date of birth, ID band Patient awake    Reviewed: Allergy & Precautions, H&P , NPO status , Patient's Chart, lab work & pertinent test results  Airway Mallampati: II TM Distance: >3 FB Neck ROM: full    Dental No notable dental hx. (+) Teeth Intact   Pulmonary neg pulmonary ROS,    Pulmonary exam normal       Cardiovascular negative cardio ROS      Neuro/Psych PSYCHIATRIC DISORDERS Anxiety    GI/Hepatic negative GI ROS, Neg liver ROS,   Endo/Other  negative endocrine ROS  Renal/GU negative Renal ROS  negative genitourinary   Musculoskeletal   Abdominal Normal abdominal exam  (+)   Peds  Hematology negative hematology ROS (+)   Anesthesia Other Findings   Reproductive/Obstetrics negative OB ROS                                                            Anesthesia Evaluation  Patient identified by MRN, date of birth, ID band Patient awake    Reviewed: Allergy & Precautions, H&P , Patient's Chart, lab work & pertinent test results, reviewed documented beta blocker date and time   Airway Mallampati: II TM Distance: >3 FB Neck ROM: full    Dental No notable dental hx.    Pulmonary  breath sounds clear to auscultation  Pulmonary exam normal       Cardiovascular Rhythm:regular Rate:Normal     Neuro/Psych    GI/Hepatic   Endo/Other    Renal/GU      Musculoskeletal   Abdominal   Peds  Hematology   Anesthesia Other Findings   Reproductive/Obstetrics                           Anesthesia Physical Anesthesia Plan  ASA: II  Anesthesia Plan: General   Post-op Pain Management:    Induction: Intravenous  Airway Management Planned: Oral ETT  Additional Equipment:   Intra-op Plan:   Post-operative Plan:   Informed Consent: I have reviewed the patients History and Physical, chart,  labs and discussed the procedure including the risks, benefits and alternatives for the proposed anesthesia with the patient or authorized representative who has indicated his/her understanding and acceptance.   Dental Advisory Given and Dental advisory given  Plan Discussed with: CRNA and Surgeon  Anesthesia Plan Comments: (  Discussed  general anesthesia, including possible nausea, instrumentation of airway, sore throat,pulmonary aspiration, etc. I asked if the were any outstanding questions, or  concerns before we proceeded. )        Anesthesia Quick Evaluation  Anesthesia Physical  Anesthesia Plan  ASA: II  Anesthesia Plan: General   Post-op Pain Management:    Induction: Intravenous  Airway Management Planned: Oral ETT  Additional Equipment:   Intra-op Plan:   Post-operative Plan: Extubation in OR  Informed Consent: I have reviewed the patients History and Physical, chart, labs and discussed the procedure including the risks, benefits and alternatives for the proposed anesthesia with the patient or authorized representative who has indicated his/her understanding and acceptance.   Dental advisory given  Plan Discussed with: CRNA and Surgeon  Anesthesia Plan Comments: (  Discussed  general anesthesia, including possible nausea, instrumentation of airway, sore  throat,pulmonary aspiration, etc. I asked if the were any outstanding questions, or  concerns before we proceeded. )        Anesthesia Quick Evaluation

## 2012-10-14 NOTE — Op Note (Signed)
Pre-Operative Diagnosis: 1) Pelvic Mass 2) Pelvic pain Postoperative Diagnosis: Bleeding Left adnexal endometrioma vs hemorrhagic corpus luteum Procedure: Laparoscopic left salpingo-oophorectomy and lysis of adhesions Surgeon: Dr. Waynard Reeds Assistant: Dr. Miguel Aschoff Operative Findings: Abdominal cavity with old blood. Normal appearing right tube and ovary. Filshie clip visualized on the right tube. Bloody fluid within the cul-de-sac. Emanating from the left ovary there was old blood oozing from a small opening on the surface of the ovary consistent with either a bleeding hemorrhagic corpus luteum or Endometrioma. Adhesive disease involving the colon to the left infundibular pelvic ligament and ovary. Specimen: Left tube and ovary EBL: Total I/O In: 2000 [I.V.:2000] Out: 200 [Urine:100; Blood:100]  Amy Marks is a 41 year old G2 P 2002 who presented to the office earlier today complaining of left lower quadrant abdominal pain. On ultrasound today a 5 cm pelvic mass was noted. She previously had had a 5 cm pelvic mass noted on an ultrasound on 09/28/2012, however now her pain was significantly increased. Given these findings the option of medical management with pain control versus surgical management was discussed with the patient. Given her pain she decided to proceed with surgery. Risks benefits and alternatives of the procedure were discussed with the patient at length prior to the procedure. The appropriate informed consent was obtained. The patient was taken to the operating room where general anesthesia was administered. The patient was placed in the dorsal supine position in [place] stirrups. Her abdomen and perineum were prepped and draped in the normal sterile fashion. A speculum was placed in the vagina and a [place] uterine manipulator was placed. The speculum was then removed. Attention was then turned to the abdominal portion of the case. Scalpel was used to make a vertical infraumbilical skin  incision. Initially, intra-abdominal entry was attempted with a varies needle. This proved to be unsuccessful due to a negative saline drop test. Therefore direct entry with a Hassan port was chosen. The subcutaneous tissue was dissected down to the layer of the fascia, the fascia was grasped and elevated, and entered with Mayo scissors. Intra-abdominal entry was confirmed with direct visualization of the intra-abdominal contents. The [place] port was placed and a pneumoperitoneum was achieved. The abdomen and pelvis were explored. Bloody fluid was noted within the abdomen. The patient was placed in steep Trendelenburg and the uterus was elevated. Old coagulated blood was also noted throughout the abdominal cavity. The right tube and ovary were normal in appearance and the Filshie clip was visualized on the right fallopian tube. Upon inspection of the left tube and ovary the left tube looked normal, the Filshie clip was noted on the isthmic portion of the left fallopian tube. The ovary, however, appeared abnormal. A small 1 cm rent was noted in the capsule of the ovary and with manipulation was noted to be draining old blood. These findings were consistent with either a draining endometrioma or a draining hemorrhagic corpus luteum. The decision was made to proceed with left salpingo-oophorectomy.  A 5 mm port was placed in the right lower quadrant under direct visualization after a 5 mm skin incision was made. And a 10 mm port was placed in the left lower quadrant after a 10 mm skin incision was made. An atraumatic grasper was used to elevate the left tube and ovary. Some adhesive disease involving the colon and the left infundibulopelvic ligament and ovary were noted. This was taken down with a combination of blunt dissection and sharp dissection with laparoscopic scissors.  The infundibulopelvic  ligament was identified, elevated, coagulated with 3 burns using the gyrus, and then cut. Serial bites along the  mesosalpinx were taken until the left tube and ovary were completely excised. Additional coagulations along the excised Mesosalpinx were performed to achieve hemostasis. A Endobag was placed in the 10 mm left lower quadrant port site, the surgical specimen was placed in the bag, and the bag was removed from the 10 mm port site. The abdomen and pelvis were then reinspected and were copiously irrigated.  The right lower quadrant port site was then removed under direct visualization, the abdomen was desufflated, and the [place] port site was removed. The left lower quadrant port site fascia was repaired with a #1 Vicryl on a UR needle in a running fashion. The fascia in the umbilical port site was also closed in the same manner. The skin of the port site was closed with 4-0 Vicryl in a subcuticular fashion and the incisions were closed with Dermabond. All sponge lap needle counts were correct. The patient was extubated in the operating room and brought to the recovery room in stable condition.

## 2012-10-14 NOTE — Transfer of Care (Signed)
Immediate Anesthesia Transfer of Care Note  Patient: Amy Marks  Procedure(s) Performed: Procedure(s) (LRB) with comments: LAPAROSCOPY OPERATIVE (N/A) EXPLORATORY LAPAROTOMY (N/A)  Patient Location: PACU  Anesthesia Type:General  Level of Consciousness: awake, alert , oriented and patient cooperative  Airway & Oxygen Therapy: Patient Spontanous Breathing and Patient connected to nasal cannula oxygen  Post-op Assessment: Report given to PACU RN, Post -op Vital signs reviewed and stable and Patient moving all extremities X 4  Post vital signs: Reviewed and stable  Complications: No apparent anesthesia complications

## 2012-10-14 NOTE — H&P (Signed)
Amy Marks is an 41 y.o. female presents with lower abdominal Pain.  Amy Marks is a 41 year old G2 P2 48 Caucasian female who was seen in the office earlier today complaining of lower, pain. She underwent a laparoscopic tubal ligation with Filshie clips and hysteroscopy, D&C, NovaSure ablation on September 03, 2012 by Dr. Henderson Cloud.  She began experiencing left lower quadrant pain earlier in December and was seen in our office on December 10. An ultrasound at that time showed a 5 cm pelvic mass with increased echogenicity. Differential diagnosis included hematosalpinx, Endometrioma, hemorrhagic inclusion cyst, ovarian torsion. The decision was made to manage her conservatively with nonsteroidal anti-inflammatory medications for 7 days at that time. She began developing severe abdominal pain yesterday afternoon and reports excruciating, 10 out of 10 pain for 2 hours around 2:00 yesterday afternoon. Pain has improved somewhat since then but she still reported to be 7/10. She also has been experiencing some back pain and soreness in her chest. She denies shoulder pain. She has been experiencing some loose stools since the onset of the pain, she had nausea with the pain but is not currently feeling nauseated. On ultrasound today there is persistence of this pelvic mass. It does appear to have the echogenicity of blood, with possible clots.There is no free fluid in the pelvis. There may be a connection between this mass and the right adnexa either secondary to adhesions or possible kissing ovaries. These findings were reviewed with the patient and her husband at length. Options for conservative management with pain medication versus surgical management were discussed. Given her level of discomfort the patient wished to proceed with surgery. Risks benefits and alternatives of the procedure were discussed with the patient at length. Will proceed with diagnostic laparoscopy, possible laparotomy, possible salpingectomy  versus oophorectomy. Given the patient's recent surgery she understands that if he should disease may be present in the pelvis making a laparoscopic approach unsuccessful. Therefore she is prepared for possible laparotomy.    Past Medical History  Diagnosis Date  . OCD (obsessive compulsive disorder)   . Anxiety     Past Surgical History  Procedure Date  . Tonsillectomy   . Nasal septum surgery   . Dilitation & currettage/hystroscopy with novasure ablation 09/03/2012    Procedure: DILATATION & CURETTAGE/HYSTEROSCOPY WITH NOVASURE ABLATION;  Surgeon: Loney Laurence, MD;  Location: [facility];  Service: Gynecology;  Laterality: N/A;  . Laparoscopic tubal ligation 09/03/2012    Procedure: LAPAROSCOPIC TUBAL LIGATION;  Surgeon: Loney Laurence, MD;  Location: [facility];  Service: Gynecology;  Laterality: Bilateral;  FILSHIE CLIPS  . Tubal ligation     Family History  Problem Relation Age of Onset  . Depression Mother   . Fibromyalgia Mother   . Alcohol abuse Father   . Cancer Maternal Grandmother     BREAST  . Emphysema Maternal Grandmother   . Multiple sclerosis Maternal Grandfather     Social History:  reports that she quit smoking about 2 years ago. She has never used smokeless tobacco. She reports that she drinks alcohol. She reports that she does not use illicit drugs.  Allergies: No Known Allergies  Prescriptions prior to admission  Medication Sig Dispense Refill  . doxycycline (VIBRA-TABS) 100 MG tablet Take 100 mg by mouth daily.      . sertraline (ZOLOFT) 50 MG tablet Take 3 tablets (150 mg total) by mouth daily.  270 tablet  1    ROS: as above  Blood pressure 119/63, pulse 82,  temperature 98 F (36.7 C), temperature source Oral, resp. rate 18, height 5\' 3"  (1.6 m), weight 84.823 kg (187 lb), last menstrual period 10/13/2012, SpO2 100.00%. Physical Exam  AOX3, uncomfortable abd soft, + rebound, no  Guarding Uterus tender U/S findings as above  Results for  orders placed during the hospital encounter of 10/14/12 (from the past 24 hour(s))  CBC WITH DIFFERENTIAL     Status: Abnormal   Collection Time   10/14/12 12:49 PM      Component Value Range   WBC 13.4 (*) 4.0 - 10.5 K/uL   RBC 4.11  3.87 - 5.11 MIL/uL   Hemoglobin 12.4  12.0 - 15.0 g/dL   HCT 16.1  09.6 - 04.5 %   MCV 90.0  78.0 - 100.0 fL   MCH 30.2  26.0 - 34.0 pg   MCHC 33.5  30.0 - 36.0 g/dL   RDW 40.9  81.1 - 91.4 %   Platelets 164  150 - 400 K/uL   Neutrophils Relative 87 (*) 43 - 77 %   Neutro Abs 11.7 (*) 1.7 - 7.7 K/uL   Lymphocytes Relative 6 (*) 12 - 46 %   Lymphs Abs 0.8  0.7 - 4.0 K/uL   Monocytes Relative 7  3 - 12 %   Monocytes Absolute 0.9  0.1 - 1.0 K/uL   Eosinophils Relative 0  0 - 5 %   Eosinophils Absolute 0.0  0.0 - 0.7 K/uL   Basophils Relative 0  0 - 1 %   Basophils Absolute 0.0  0.0 - 0.1 K/uL  COMPREHENSIVE METABOLIC PANEL     Status: Abnormal   Collection Time   10/14/12 12:49 PM      Component Value Range   Sodium 136  135 - 145 mEq/L   Potassium 3.9  3.5 - 5.1 mEq/L   Chloride 103  96 - 112 mEq/L   CO2 23  19 - 32 mEq/L   Glucose, Bld 95  70 - 99 mg/dL   BUN 14  6 - 23 mg/dL   Creatinine, Ser 7.82  0.50 - 1.10 mg/dL   Calcium 9.1  8.4 - 95.6 mg/dL   Total Protein 6.6  6.0 - 8.3 g/dL   Albumin 3.8  3.5 - 5.2 g/dL   AST 39 (*) 0 - 37 U/L   ALT 41 (*) 0 - 35 U/L   Alkaline Phosphatase 67  39 - 117 U/L   Total Bilirubin 1.0  0.3 - 1.2 mg/dL   GFR calc non Af Amer >90  >90 mL/min   GFR calc Af Amer >90  >90 mL/min    No results found.  Assessment/Plan: 1) Diagnostic L/S, possible laparotomy, possible oophorectomy vs salpingectomy for acute pelvic pain and pelvic mass. 2) SCDs 3) Ancef OCTOR  Amy Marks H. 10/14/2012, 3:45 PM

## 2012-10-15 ENCOUNTER — Encounter (HOSPITAL_COMMUNITY): Payer: Self-pay | Admitting: Obstetrics and Gynecology

## 2012-10-29 ENCOUNTER — Other Ambulatory Visit: Payer: Self-pay | Admitting: Family Medicine

## 2012-11-12 ENCOUNTER — Ambulatory Visit (INDEPENDENT_AMBULATORY_CARE_PROVIDER_SITE_OTHER): Admitting: *Deleted

## 2012-11-12 DIAGNOSIS — Z23 Encounter for immunization: Secondary | ICD-10-CM

## 2013-01-21 ENCOUNTER — Encounter: Payer: Self-pay | Admitting: Family Medicine

## 2013-01-21 ENCOUNTER — Ambulatory Visit (INDEPENDENT_AMBULATORY_CARE_PROVIDER_SITE_OTHER): Admitting: Family Medicine

## 2013-01-21 VITALS — BP 94/70 | HR 72 | Temp 98.0°F | Wt 188.0 lb

## 2013-01-21 DIAGNOSIS — K529 Noninfective gastroenteritis and colitis, unspecified: Secondary | ICD-10-CM | POA: Insufficient documentation

## 2013-01-21 DIAGNOSIS — R197 Diarrhea, unspecified: Secondary | ICD-10-CM

## 2013-01-21 DIAGNOSIS — R894 Abnormal immunological findings in specimens from other organs, systems and tissues: Secondary | ICD-10-CM

## 2013-01-21 DIAGNOSIS — R110 Nausea: Secondary | ICD-10-CM

## 2013-01-21 DIAGNOSIS — R768 Other specified abnormal immunological findings in serum: Secondary | ICD-10-CM

## 2013-01-21 LAB — COMPREHENSIVE METABOLIC PANEL
ALT: 20 U/L (ref 0–35)
AST: 23 U/L (ref 0–37)
Albumin: 4.3 g/dL (ref 3.5–5.2)
Alkaline Phosphatase: 53 U/L (ref 39–117)
BUN: 16 mg/dL (ref 6–23)
CO2: 27 mEq/L (ref 19–32)
Calcium: 9.3 mg/dL (ref 8.4–10.5)
Chloride: 104 mEq/L (ref 96–112)
Creatinine, Ser: 0.8 mg/dL (ref 0.4–1.2)
GFR: 84.3 mL/min (ref >60.00)
Glucose, Bld: 85 mg/dL (ref 70–99)
Potassium: 4.6 mEq/L (ref 3.5–5.1)
Sodium: 138 mEq/L (ref 135–145)
Total Bilirubin: 0.5 mg/dL (ref 0.3–1.2)
Total Protein: 7.6 g/dL (ref 6.0–8.3)

## 2013-01-21 LAB — CBC WITH DIFFERENTIAL/PLATELET
Basophils Absolute: 0 10*3/uL (ref 0.0–0.1)
Basophils Relative: 0.5 % (ref 0.0–3.0)
Eosinophils Absolute: 0.1 10*3/uL (ref 0.0–0.7)
Eosinophils Relative: 1.9 % (ref 0.0–5.0)
HCT: 38.1 % (ref 36.0–46.0)
Hemoglobin: 12.9 g/dL (ref 12.0–15.0)
Lymphocytes Relative: 29.3 % (ref 12.0–46.0)
Lymphs Abs: 1.3 10*3/uL (ref 0.7–4.0)
MCHC: 34 g/dL (ref 30.0–36.0)
MCV: 88.8 fl (ref 78.0–100.0)
Monocytes Absolute: 0.3 10*3/uL (ref 0.1–1.0)
Monocytes Relative: 7.5 % (ref 3.0–12.0)
Neutro Abs: 2.6 10*3/uL (ref 1.4–7.7)
Neutrophils Relative %: 60.8 % (ref 43.0–77.0)
Platelets: 169 10*3/uL (ref 150.0–400.0)
RBC: 4.29 Mil/uL (ref 3.87–5.11)
RDW: 12.8 % (ref 11.5–14.6)
WBC: 4.3 10*3/uL — ABNORMAL LOW (ref 4.5–10.5)

## 2013-01-21 LAB — TSH: TSH: 1.83 u[IU]/mL (ref 0.35–5.50)

## 2013-01-21 NOTE — Patient Instructions (Signed)
Hold any ibuprofen. Stop at lab on your way out. Can try prilosec OTC 20 mg daily x 1-2 weeks to see if helping with nausea. If labs appear nml we will likely refer to GI.

## 2013-01-21 NOTE — Assessment & Plan Note (Addendum)
Does not sound infecitous given length of symptoms. Sounds most like IBS... eval with labs. If negative.. Refer to GI for further eval, possible colonoscopy.

## 2013-01-21 NOTE — Assessment & Plan Note (Addendum)
No clear reflux.. No new meds.? Secondary to use of ibuprofen after surgery, but no epigastric pain. Trial of low dose prilosec.  Will eval with labs.

## 2013-01-21 NOTE — Addendum Note (Signed)
Addended by: Alvina Chou on: 01/21/2013 10:03 AM   Modules accepted: Orders

## 2013-01-21 NOTE — Progress Notes (Signed)
  Subjective:    Patient ID: Amy Marks, female    DOB: 08-Mar-1971, 42 y.o.   MRN: [id number]  HPI  42 year old female presents with years of diarrhea, now  in last 3 weeks nausea has started. Having BMs 3-8 per day, watery to loose, rarely firm.  Diarrhea is basically at her baseline.. Gas and nausea are new. No blood in stool. No mucus. Occ abdominal pain but mild,  Has a lot of gas and bloating after eating. Stomach grumbles a lot.  Some relief after BM. Nausea off and on, no vomiting.  Peanut butter makes it worse.  [place] food makes diarrhea worse. No fever.  Symptoms have been worse since emergency surgery for endometriosis , chocolate cyst rupture in 09/2012. No CT done at that time.  Eating more bread now because of nausea.  Family history of Crohn's in great aunt. No family history of colon cancer. Personal history of uterine ablation and removal of left ovary and tube.  No new medication. Uses ibuprofen rarely. Anxiety: fairly well controlled on sertraline.  Review of Systems  Constitutional: Negative for fever and fatigue.  HENT: Negative for ear pain.   Eyes: Negative for pain.  Respiratory: Negative for chest tightness and shortness of breath.   Cardiovascular: Negative for chest pain, palpitations and leg swelling.  Gastrointestinal: Negative for abdominal pain.  Genitourinary: Negative for dysuria.       Objective:   Physical Exam  Constitutional: Vital signs are normal. She appears well-developed and well-nourished. She is cooperative.  Non-toxic appearance. She does not appear ill. No distress.  HENT:  Head: Normocephalic.  Right Ear: Hearing, tympanic membrane, external ear and ear canal normal. Tympanic membrane is not erythematous, not retracted and not bulging.  Left Ear: Hearing, tympanic membrane, external ear and ear canal normal. Tympanic membrane is not erythematous, not retracted and not bulging.  Nose: No mucosal edema or rhinorrhea. Right  sinus exhibits no maxillary sinus tenderness and no frontal sinus tenderness. Left sinus exhibits no maxillary sinus tenderness and no frontal sinus tenderness.  Mouth/Throat: Uvula is midline, oropharynx is clear and moist and mucous membranes are normal.  Eyes: Conjunctivae, EOM and lids are normal. Pupils are equal, round, and reactive to light. No foreign bodies found.  Neck: Trachea normal and normal range of motion. Neck supple. Carotid bruit is not present. No mass and no thyromegaly present.  Cardiovascular: Normal rate, regular rhythm, S1 normal, S2 normal, normal heart sounds, intact distal pulses and normal pulses.  Exam reveals no gallop and no friction rub.   No murmur heard. Pulmonary/Chest: Effort normal and breath sounds normal. Not tachypneic. No respiratory distress. She has no decreased breath sounds. She has no wheezes. She has no rhonchi. She has no rales.  Abdominal: Soft. Normal appearance and bowel sounds are normal. There is no hepatosplenomegaly. There is tenderness in the left lower quadrant. There is no CVA tenderness.  Mild ttp LLQ  Neurological: She is alert.  Skin: Skin is warm, dry and intact. No rash noted.  Psychiatric: Her speech is normal and behavior is normal. Judgment and thought content normal. Her mood appears not anxious. Cognition and memory are normal. She does not exhibit a depressed mood.          Assessment & Plan:

## 2013-01-25 LAB — GLIADIN ANTIBODIES, SERUM
Gliadin IgA: 45.6 U/mL — ABNORMAL HIGH (ref <20)
Gliadin IgG: 9.7 U/mL (ref <20)

## 2013-01-25 NOTE — Addendum Note (Signed)
Addended by: Alvina Chou on: 01/25/2013 09:22 AM   Modules accepted: Orders

## 2013-01-25 NOTE — Addendum Note (Signed)
Addended by: Kerby Nora E on: 01/25/2013 05:03 PM   Modules accepted: Orders

## 2013-01-27 ENCOUNTER — Encounter: Payer: Self-pay | Admitting: Gastroenterology

## 2013-01-27 LAB — FECAL FAT QUALITATIVE
Free Fatty Acids: NORMAL
NEUTRAL FAT: NORMAL

## 2013-01-29 ENCOUNTER — Emergency Department: Payer: Self-pay | Admitting: Emergency Medicine

## 2013-01-31 ENCOUNTER — Encounter: Payer: Self-pay | Admitting: Gastroenterology

## 2013-01-31 ENCOUNTER — Ambulatory Visit (INDEPENDENT_AMBULATORY_CARE_PROVIDER_SITE_OTHER): Admitting: Gastroenterology

## 2013-01-31 ENCOUNTER — Other Ambulatory Visit

## 2013-01-31 VITALS — BP 100/66 | HR 68 | Ht 63.0 in | Wt 187.4 lb

## 2013-01-31 DIAGNOSIS — R141 Gas pain: Secondary | ICD-10-CM

## 2013-01-31 DIAGNOSIS — R110 Nausea: Secondary | ICD-10-CM

## 2013-01-31 DIAGNOSIS — R140 Abdominal distension (gaseous): Secondary | ICD-10-CM

## 2013-01-31 DIAGNOSIS — R197 Diarrhea, unspecified: Secondary | ICD-10-CM

## 2013-01-31 DIAGNOSIS — R7989 Other specified abnormal findings of blood chemistry: Secondary | ICD-10-CM

## 2013-01-31 MED ORDER — HYOSCYAMINE SULFATE 0.125 MG SL SUBL: 0.1250 mg | SUBLINGUAL_TABLET | SUBLINGUAL | Status: DC | PRN

## 2013-01-31 MED ORDER — MOVIPREP 100 G PO SOLR: 1.0000 | Freq: Once | ORAL | Status: DC

## 2013-01-31 NOTE — Patient Instructions (Addendum)
You have been scheduled for an endoscopy and colonoscopy with propofol. Please follow the written instructions given to you at your visit today. Please pick up your prep at the pharmacy within the next 1-3 days. If you use inhalers (even only as needed), please bring them with you on the day of your procedure. Your physician has requested that you go to the basement for the following lab work before leaving today: We have sent the following medications to your pharmacy for you to pick up at your convenience: We have given you stool cards to take home and mail back as directed. CC:  Amy Marks M.D.

## 2013-01-31 NOTE — Progress Notes (Signed)
History of Present Illness: This is a 42 year old female with a several year history of loose, soft, frequent bowel movements occurring between 2 and 8 times per day. The symptoms have not substantially changed. Over the past 3-4 weeks she notes postprandial nausea, abdominal bloating, gas and flatus. She was recently started on amoxicillin and hydrocodone for a left ear infection. She's been treated with doxycycline for approximately one year for adult acne. She takes ibuprofen on occasion. She notes a slow weight gain. Her antigliadin IgA antibody was elevated, antigliadin IgG G. antibody was normal, tissue transglutaminase was not tested. She has an aunt and cousins with Crohn's disease. Denies weight loss, abdominal pain, diarrhea, change in stool caliber, melena, hematochezia, vomiting, dysphagia, reflux symptoms, chest pain.  Review of Systems: Pertinent positive and negative review of systems were noted in the above HPI section. All other review of systems were otherwise negative.  Current Medications, Allergies, Past Medical History, Past Surgical History, Family History and Social History were reviewed in [employer] record.  Physical Exam: General: Well developed , well nourished, no acute distress Head: Normocephalic and atraumatic Eyes:  sclerae anicteric, EOMI Ears: Normal auditory acuity Mouth: No deformity or lesions Neck: Supple, no masses or thyromegaly Lungs: Clear throughout to auscultation Heart: Regular rate and rhythm; no murmurs, rubs or bruits Abdomen: Soft, non tender and non distended. No masses, hepatosplenomegaly or hernias noted. Normal Bowel sounds Rectal: Deferred to colonoscopy Musculoskeletal: Symmetrical with no gross deformities  Skin: No lesions on visible extremities Pulses:  Normal pulses noted Extremities: No clubbing, cyanosis, edema or deformities noted Neurological: Alert oriented x 4, grossly nonfocal Cervical Nodes:  No  significant cervical adenopathy Inguinal Nodes: No significant inguinal adenopathy Psychological:  Alert and cooperative. Normal mood and affect  Assessment and Recommendations:  1. Chronic diarrhea now associated with nausea and abdominal bloating and gas. Rule out celiac disease. IBD. Obtain a tissue transglutaminase and schedule upper endoscopy for duodenal biopsies. Obtain standard stool cultures and stool Hemoccults. Schedule colonoscopy to further evaluate her chronic diarrhea. The risks, benefits, and alternatives to colonoscopy with possible biopsy and possible polypectomy were discussed with the patient and they consent to proceed. The risks, benefits, and alternatives to endoscopy with possible biopsy and possible dilation were discussed with the patient and they consent to proceed.

## 2013-02-01 LAB — TISSUE TRANSGLUTAMINASE, IGA: Tissue Transglutaminase Ab, IgA: 2.3 U/mL (ref <20)

## 2013-02-03 ENCOUNTER — Ambulatory Visit (INDEPENDENT_AMBULATORY_CARE_PROVIDER_SITE_OTHER): Admitting: Family Medicine

## 2013-02-03 ENCOUNTER — Encounter: Payer: Self-pay | Admitting: Family Medicine

## 2013-02-03 VITALS — BP 100/70 | HR 70 | Temp 98.6°F | Wt 190.0 lb

## 2013-02-03 DIAGNOSIS — H6692 Otitis media, unspecified, left ear: Secondary | ICD-10-CM

## 2013-02-03 DIAGNOSIS — H6690 Otitis media, unspecified, unspecified ear: Secondary | ICD-10-CM

## 2013-02-03 MED ORDER — CEFDINIR 300 MG PO CAPS: 600.0000 mg | ORAL_CAPSULE | Freq: Every day | ORAL | Status: DC

## 2013-02-03 NOTE — Progress Notes (Signed)
Nature conservation officer at [facility] [address] [place] [address] Phone: [phone] Fax: [phone]  Date:  02/03/2013   Name:  Amy Marks   DOB:  Sep 24, 1971   MRN:  [id number] Gender: female Age: 42 y.o.  Primary Physician:  Hannah Beat, MD  Evaluating MD: Hannah Beat, MD   Chief Complaint: Otalgia   History of Present Illness:  Amy Marks is a 42 y.o. pleasant patient who presents with the following:  Has been having a lot of diarrhea.   Went to the ER at [facility] on Saturday night. Has been on Amox 500 mg TID. Better than when went that night, but she is still having a lot of L ear pain. Pain around ear and jaw. No st. No n/v. Has had a lot of intermittent diarrhea. Has upcoming endoscopy / colon with Dr. Russella Dar.  Patient Active Problem List  Diagnosis  . DISORDERS, OBSESSIVE-COMPULSIVE  . FATIGUE  . ANXIETY STATE, UNSPECIFIED  . Carpal tunnel syndrome, bilateral  . Chronic diarrhea    Past Medical History  Diagnosis Date  . OCD (obsessive compulsive disorder)   . Anxiety     Past Surgical History  Procedure Laterality Date  . Tonsillectomy    . Nasal septum surgery    . Dilitation & currettage/hystroscopy with novasure ablation  09/03/2012    Procedure: DILATATION & CURETTAGE/HYSTEROSCOPY WITH NOVASURE ABLATION;  Surgeon: Loney Laurence, MD;  Location: [facility];  Service: Gynecology;  Laterality: N/A;  . Laparoscopic tubal ligation  09/03/2012    Procedure: LAPAROSCOPIC TUBAL LIGATION;  Surgeon: Loney Laurence, MD;  Location: [facility];  Service: Gynecology;  Laterality: Bilateral;  FILSHIE CLIPS  . Tubal ligation    . Laparoscopy  10/14/2012    Procedure: LAPAROSCOPY OPERATIVE;  Surgeon: Freddrick March. Tenny Craw, MD;  Location: [facility];  Service: Gynecology;  Laterality: N/A;  . Laparotomy  10/14/2012    Procedure: EXPLORATORY LAPAROTOMY;  Surgeon: Freddrick March. Tenny Craw, MD;  Location: [facility];  Service: Gynecology;  Laterality: N/A;    History    Social History  . Marital Status: Married    Spouse Name: N/A    Number of Children: 2  . 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 Smoking status: Former Smoker    Quit date: 08/31/2010  . Smokeless tobacco: Never Used  . Alcohol Use: Yes     Comment: rare  . Drug Use: No  . Sexually Active: Not on file   Other Topics Concern  . Not on file   Social History Narrative  . No narrative on file    Family History  Problem Relation Age of Onset  . Depression Mother   . Fibromyalgia Mother   . Breast cancer Mother   . Alcohol abuse Father   . Emphysema Maternal Grandmother   . Breast cancer Maternal Grandmother   . Multiple sclerosis Maternal Grandfather   . Breast cancer Paternal Aunt     No Known Allergies  Medication list has been reviewed and updated.  Outpatient Prescriptions Prior to Visit  Medication Sig Dispense Refill  . amoxicillin (AMOXIL) 500 MG tablet Take 500 mg by mouth 3 (three) times daily.      Marland Kitchen antipyrine-benzocaine (AURALGAN) otic solution 3 drops every 2 (two) hours as needed for pain.      Marland Kitchen doxycycline (VIBRA-TABS) 100 MG tablet Take 100 mg by mouth daily.      Marland Kitchen  HYDROcodone-acetaminophen (NORCO/VICODIN) 5-325 MG per tablet Take 1 tablet by mouth every 6 (six) hours as needed for pain.      . hyoscyamine (LEVSIN SL) 0.125 MG SL tablet Place 1 tablet (0.125 mg total) under the tongue every 4 (four) hours as needed for cramping or diarrhea or loose stools (bloating).  30 tablet  0  . ibuprofen (ADVIL,MOTRIN) 800 MG tablet Take 800 mg by mouth every 8 (eight) hours as needed for pain.      Marland Kitchen MOVIPREP 100 G SOLR Take 1 kit (100 g total) by mouth once.  1 kit  0  . sertraline (ZOLOFT) 50 MG tablet TAKE 3 TABLETS (150 MG) DAILY  270 tablet  0   No facility-administered medications prior to visit.    Review of Systems:  ROS: GEN: Acute illness details above GI: Tolerating PO intake GU:  maintaining adequate hydration and urination Pulm: No SOB Interactive and getting along well at home.  Otherwise, ROS is as per the HPI.   Physical Examination: BP 100/70  Pulse 70  Temp(Src) 98.6 F (37 C) (Oral)  Wt 190 lb (86.183 kg)  BMI 33.67 kg/m2  SpO2 99%  LMP 01/31/2013  Ideal Body Weight:     Gen: WDWN, NAD; A & O x3, cooperative. Pleasant.Globally Non-toxic HEENT: Normocephalic and atraumatic. Throat clear, w/o exudate, R TM clear, L TM: no landmarks, very bulging and completely red. no rhinnorhea.  MMM Frontal sinuses: NT Max sinuses: NT NECK: Anterior cervical  LAD is mildly present CV: RRR, No M/G/R, cap refill <2 sec PULM: Breathing comfortably in no respiratory distress. no wheezing, crackles, rhonchi EXT: No c/c/e PSYCH: Friendly, good eye contact MSK: Nml gait    Assessment and Plan:  Otitis media, left  Suspect amox dose not high enough. Change to omnicef. Cont auralgan.  Orders Today:  No orders of the defined types were placed in this encounter.    Updated Medication List: (Includes new medications, updates to list, dose adjustments) Meds ordered this encounter  Medications  . cefdinir (OMNICEF) 300 MG capsule    Sig: Take 2 capsules (600 mg total) by mouth daily.    Dispense:  20 capsule    Refill:  0    Medications Discontinued: Medications Discontinued During This Encounter  Medication Reason  . amoxicillin (AMOXIL) 500 MG tablet       Signed, Mayia Megill T. Junnie Loschiavo, MD 02/03/2013 10:01 AM

## 2013-02-07 ENCOUNTER — Telehealth: Payer: Self-pay | Admitting: Family Medicine

## 2013-02-07 ENCOUNTER — Ambulatory Visit (INDEPENDENT_AMBULATORY_CARE_PROVIDER_SITE_OTHER): Admitting: Family Medicine

## 2013-02-07 ENCOUNTER — Encounter: Payer: Self-pay | Admitting: Family Medicine

## 2013-02-07 VITALS — BP 120/72 | HR 74 | Temp 98.6°F | Ht 63.0 in | Wt 190.5 lb

## 2013-02-07 DIAGNOSIS — H6690 Otitis media, unspecified, unspecified ear: Secondary | ICD-10-CM

## 2013-02-07 DIAGNOSIS — H6692 Otitis media, unspecified, left ear: Secondary | ICD-10-CM

## 2013-02-07 NOTE — Progress Notes (Signed)
Nature conservation officer at [facility] [address] [place] [address] Phone: [phone] Fax: [phone]  Date:  02/07/2013   Name:  Amy Marks   DOB:  January 12, 1971   MRN:  [id number] Gender: female Age: 42 y.o.  Primary Physician:  Hannah Beat, MD  Evaluating MD: Hannah Beat, MD   Chief Complaint: Otalgia   History of Present Illness:  Amy Marks is a 42 y.o. pleasant patient who presents with the following:  F/u LOM: initially placed on Amox 500 mg bid, then I saw her on Thursday and placed on Omnicef 600 mg daily. Continues with left ear pain.  Patient Active Problem List  Diagnosis  . DISORDERS, OBSESSIVE-COMPULSIVE  . FATIGUE  . ANXIETY STATE, UNSPECIFIED  . Carpal tunnel syndrome, bilateral  . Chronic diarrhea    Past Medical History  Diagnosis Date  . OCD (obsessive compulsive disorder)   . Anxiety     Past Surgical History  Procedure Laterality Date  . Tonsillectomy    . Nasal septum surgery    . Dilitation & currettage/hystroscopy with novasure ablation  09/03/2012    Procedure: DILATATION & CURETTAGE/HYSTEROSCOPY WITH NOVASURE ABLATION;  Surgeon: Loney Laurence, MD;  Location: [facility];  Service: Gynecology;  Laterality: N/A;  . Laparoscopic tubal ligation  09/03/2012    Procedure: LAPAROSCOPIC TUBAL LIGATION;  Surgeon: Loney Laurence, MD;  Location: [facility];  Service: Gynecology;  Laterality: Bilateral;  FILSHIE CLIPS  . Tubal ligation    . Laparoscopy  10/14/2012    Procedure: LAPAROSCOPY OPERATIVE;  Surgeon: Freddrick March. Tenny Craw, MD;  Location: [facility];  Service: Gynecology;  Laterality: N/A;  . Laparotomy  10/14/2012    Procedure: EXPLORATORY LAPAROTOMY;  Surgeon: Freddrick March. Tenny Craw, MD;  Location: [facility];  Service: Gynecology;  Laterality: N/A;    History   Social History  . Marital Status: Married    Spouse Name: N/A    Number of Children: 2  . Years of Education: N/A   Occupational History  . 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 Smoking status: Former Smoker    Quit date: 08/31/2010  . Smokeless tobacco: Never Used  . Alcohol Use: Yes     Comment: rare  . Drug Use: No  . Sexually Active: Not on file   Other Topics Concern  . Not on file   Social History Narrative  . No narrative on file    Family History  Problem Relation Age of Onset  . Depression Mother   . Fibromyalgia Mother   . Breast cancer Mother   . Alcohol abuse Father   . Emphysema Maternal Grandmother   . Breast cancer Maternal Grandmother   . Multiple sclerosis Maternal Grandfather   . Breast cancer Paternal Aunt     No Known Allergies  Medication list has been reviewed and updated.  Outpatient Prescriptions Prior to Visit  Medication Sig Dispense Refill  . antipyrine-benzocaine (AURALGAN) otic solution 3 drops every 2 (two) hours as needed for pain.      . cefdinir (OMNICEF) 300 MG capsule Take 2 capsules (600 mg total) by mouth daily.  20 capsule  0  . ibuprofen (ADVIL,MOTRIN) 800 MG tablet Take 800 mg by mouth every 8 (eight) hours as needed for pain.      Marland Kitchen sertraline (ZOLOFT) 50 MG tablet TAKE 3 TABLETS (150 MG) DAILY  270 tablet  0  . HYDROcodone-acetaminophen (NORCO/VICODIN) 5-325 MG per tablet Take 1 tablet  by mouth every 6 (six) hours as needed for pain.      Marland Kitchen doxycycline (VIBRA-TABS) 100 MG tablet Take 100 mg by mouth daily.      . hyoscyamine (LEVSIN SL) 0.125 MG SL tablet Place 1 tablet (0.125 mg total) under the tongue every 4 (four) hours as needed for cramping or diarrhea or loose stools (bloating).  30 tablet  0  . MOVIPREP 100 G SOLR Take 1 kit (100 g total) by mouth once.  1 kit  0   No facility-administered medications prior to visit.    Review of Systems:   GEN: no fevers, chills. GI: No n/v/d, eating normally Pulm: No SOB Interactive and getting along well at home.  Otherwise, ROS is as per the HPI.   Physical Examination: BP 120/72  Pulse 74  Temp(Src) 98.6 F (37 C) (Oral)   Ht 5\' 3"  (1.6 m)  Wt 190 lb 8 oz (86.41 kg)  BMI 33.75 kg/m2  SpO2 97%  LMP 01/31/2013  Ideal Body Weight: Weight in (lb) to have BMI = 25: 140.8   Gen: WDWN, NAD; A & O x3, cooperative. Pleasant.Globally Non-toxic HEENT: Normocephalic and atraumatic. Throat clear, w/o exudate, R TM clear, but serous fluid, L TM - poor landmarks, less red, less bulging. no rhinnorhea.  MMM Frontal sinuses: NT Max sinuses: NT NECK: Anterior cervical  LAD is absent CV: RRR, No M/G/R, cap refill <2 sec PULM: Breathing comfortably in no respiratory distress. no wheezing, crackles, rhonchi EXT: No c/c/e PSYCH: Friendly, good eye contact MSK: Nml gait    Assessment and Plan:  Otitis media, left  On inspection, improving on exam. But still with some pain, ? If improved. She is going to call me Wednesday morning, and if not better, will have ENT eval.  Orders Today:  No orders of the defined types were placed in this encounter.    Updated Medication List: (Includes new medications, updates to list, dose adjustments) No orders of the defined types were placed in this encounter.    Medications Discontinued: Medications Discontinued During This Encounter  Medication Reason  . doxycycline (VIBRA-TABS) 100 MG tablet Error  . MOVIPREP 100 G SOLR Error  . hyoscyamine (LEVSIN SL) 0.125 MG SL tablet Error      Signed, Naomee Nowland T. Renelda Kilian, MD 02/07/2013 10:45 AM

## 2013-02-07 NOTE — Telephone Encounter (Signed)
Patient Information:  Caller Name: Lauralyn  Phone: [phone]  Patient: Amy Marks, Amy Marks  Gender: Female  DOB: July 21, 1971  Age: 42 Years  PCP: Hannah Beat [facility])  Pregnant: No  Office Follow Up:  Does the office need to follow up with this patient?: No  Instructions For The Office: N/A   Symptoms  Reason For Call & Symptoms: Patient was seen in the ER [facility] for left ear pain.  She was a given amoxicillen 500mg  and ear drops. She came to the office 02/03/13 and Dr. Dallas Schimke placed her Cefdinir and Aurgalan . She has been on them since Thursday and is not any better. She describes Throbbing  Reviewed Health History In EMR: Yes  Reviewed Medications In EMR: Yes  Reviewed Allergies In EMR: Yes  Reviewed Surgeries / Procedures: Yes  Date of Onset of Symptoms: 01/29/2013  Treatments Tried: Ibuprofen 800mg  , Cefdinir, Aurgalan  Treatments Tried Worked: No OB / GYN:  LMP: 02/03/2013  Guideline(s) Used:  Earache  Disposition Per Guideline:   See Today in Office  Reason For Disposition Reached:   All other earaches (Exceptions: earache lasting < 1 hour, and earache from air travel)  Advice Given:  Pain Medicines:  For pain relief, you can take either acetaminophen, ibuprofen, or naproxen.  They are over-the-counter (OTC) pain drugs. You can buy them at the drugstore.  Acetaminophen (e.g., Tylenol):  Regular Strength Tylenol: Take 650 mg (two 325 mg pills) by mouth every 4-6 hours as needed. Each Regular Strength Tylenol pill has 325 mg of acetaminophen.  Extra Strength Tylenol: Take 1,000 mg (two 500 mg pills) every 8 hours as needed. Each Extra Strength Tylenol pill has 500 mg of acetaminophen.  The most you should take each day is 3,000 mg (10 Regular Strength or 6 Extra Strength pills a day).  Ibuprofen (e.g., Motrin, Advil):  Take 400 mg (two 200 mg pills) by mouth every 6 hours.  Another choice is to take 600 mg (three 200 mg pills) by mouth every 8  hours.  The most you should take each day is 1,200 mg (six 200 mg pills), unless your doctor has told you to take more.  Call Back If  High fever, severe headache, or stiff neck occurs  You become worse.  Patient Will Follow Care Advice:  YES  Appointment Scheduled:  02/07/2013 10:30:00 Appointment Scheduled Provider:  Hannah Beat [facility])

## 2013-02-08 ENCOUNTER — Other Ambulatory Visit

## 2013-02-08 DIAGNOSIS — R110 Nausea: Secondary | ICD-10-CM

## 2013-02-08 DIAGNOSIS — R197 Diarrhea, unspecified: Secondary | ICD-10-CM

## 2013-02-08 DIAGNOSIS — R140 Abdominal distension (gaseous): Secondary | ICD-10-CM

## 2013-02-08 DIAGNOSIS — R7989 Other specified abnormal findings of blood chemistry: Secondary | ICD-10-CM

## 2013-02-09 LAB — CLOSTRIDIUM DIFFICILE BY PCR: Toxigenic C. Difficile by PCR: NOT DETECTED

## 2013-02-09 LAB — OVA AND PARASITE SCREEN: OP: NONE SEEN

## 2013-02-10 LAB — GIARDIA ANTIGEN: Giardia Screen (EIA): NEGATIVE

## 2013-02-12 LAB — STOOL CULTURE

## 2013-02-14 ENCOUNTER — Ambulatory Visit (AMBULATORY_SURGERY_CENTER): Admitting: Gastroenterology

## 2013-02-14 ENCOUNTER — Encounter: Payer: Self-pay | Admitting: Gastroenterology

## 2013-02-14 VITALS — BP 104/51 | HR 58 | Temp 98.6°F | Resp 19 | Ht 63.0 in | Wt 187.0 lb

## 2013-02-14 DIAGNOSIS — K529 Noninfective gastroenteritis and colitis, unspecified: Secondary | ICD-10-CM

## 2013-02-14 DIAGNOSIS — R197 Diarrhea, unspecified: Secondary | ICD-10-CM

## 2013-02-14 DIAGNOSIS — D126 Benign neoplasm of colon, unspecified: Secondary | ICD-10-CM

## 2013-02-14 DIAGNOSIS — D1330 Benign neoplasm of unspecified part of small intestine: Secondary | ICD-10-CM

## 2013-02-14 MED ORDER — SODIUM CHLORIDE 0.9 % IV SOLN: 500.0000 mL | INTRAVENOUS | Status: DC

## 2013-02-14 NOTE — Progress Notes (Signed)
Patient did not experience any of the following events: a burn prior to discharge; a fall within the facility; wrong site/side/patient/procedure/implant event; or a hospital transfer or hospital admission upon discharge from the facility. (G8907) Patient did not have preoperative order for IV antibiotic SSI prophylaxis. (G8918)  

## 2013-02-14 NOTE — Op Note (Signed)
[facility] [address] [place], [address]   COLONOSCOPY PROCEDURE REPORT  PATIENT: Amy Marks, Amy Marks  MR#: [id number] BIRTHDATE: 1970/11/11 , 41  yrs. old GENDER: Female ENDOSCOPIST: Meryl Dare, MD, [facility] REFERRED HQ:[id number] Ward Chatters, M.D. PROCEDURE DATE:  02/14/2013 PROCEDURE:   Colonoscopy with biopsy ASA CLASS:   Class II INDICATIONS:chronic diarrhea. MEDICATIONS: MAC sedation, administered by CRNA and propofol (Diprivan) 450mg  IV DESCRIPTION OF PROCEDURE:   After the risks benefits and alternatives of the procedure were thoroughly explained, informed consent was obtained.  A digital rectal exam revealed no abnormalities of the rectum.   The LB CF-H180AL [id number]  endoscope was introduced through the anus and advanced to the terminal ileum which was intubated for a short distance. No adverse events experienced.   The quality of the prep was excellent, using MoviPrep  The instrument was then slowly withdrawn as the colon was fully examined.  COLON FINDINGS: A normal appearing cecum, ileocecal valve, and appendiceal orifice were identified.  The ascending, hepatic flexure, transverse, splenic flexure, descending, sigmoid colon and rectum appeared unremarkable.  No polyps or cancers were seen. Multiple random biopsies of the area were performed.   The mucosa appeared normal in the terminal ileum.  Retroflexed views revealed no abnormalities. The time to cecum=1 minutes 40 seconds. Withdrawal time=8 minutes 46 seconds.  The scope was withdrawn and the procedure completed.  COMPLICATIONS: There were no complications.  ENDOSCOPIC IMPRESSION: 1.   Normal colon; multiple random biopsies of the area were performed 2.   Normal mucosa in the terminal ileum  RECOMMENDATIONS: 1.  Await pathology results 2.  Continue current colorectal screening recommendations for "routine risk" patients with a repeat colonoscopy in 10 years.  eSigned:  Meryl Dare,  MD, [facility] 02/14/2013 2:59 PM

## 2013-02-14 NOTE — Patient Instructions (Addendum)
Normal colonoscopy and upper endoscopy.  Multiple biopsies taken.  Await pathology results.  YOU HAD AN ENDOSCOPIC PROCEDURE TODAY AT THE [facility]: Refer to the procedure report that was given to you for any specific questions about what was found during the examination.  If the procedure report does not answer your questions, please call your gastroenterologist to clarify.  If you requested that your care partner not be given the details of your procedure findings, then the procedure report has been included in a sealed envelope for you to review at your convenience later.  YOU SHOULD EXPECT: Some feelings of bloating in the abdomen. Passage of more gas than usual.  Walking can help get rid of the air that was put into your GI tract during the procedure and reduce the bloating. If you had a lower endoscopy (such as a colonoscopy or flexible sigmoidoscopy) you may notice spotting of blood in your stool or on the toilet paper. If you underwent a bowel prep for your procedure, then you may not have a normal bowel movement for a few days.  DIET: Your first meal following the procedure should be a light meal and then it is ok to progress to your normal diet.  A half-sandwich or bowl of soup is an example of a good first meal.  Heavy or fried foods are harder to digest and may make you feel nauseous or bloated.  Likewise meals heavy in dairy and vegetables can cause extra gas to form and this can also increase the bloating.  Drink plenty of fluids but you should avoid alcoholic beverages for 24 hours.  ACTIVITY: Your care partner should take you home directly after the procedure.  You should plan to take it easy, moving slowly for the rest of the day.  You can resume normal activity the day after the procedure however you should NOT DRIVE or use heavy machinery for 24 hours (because of the sedation medicines used during the test).    SYMPTOMS TO REPORT IMMEDIATELY: A gastroenterologist can  be reached at any hour.  During normal business hours, 8:30 AM to 5:00 PM Monday through Friday, call [phone].  After hours and on weekends, please call the GI answering service at [phone] who will take a message and have the physician on call contact you.   Following lower endoscopy (colonoscopy or flexible sigmoidoscopy):  Excessive amounts of blood in the stool  Significant tenderness or worsening of abdominal pains  Swelling of the abdomen that is new, acute  Fever of 100F or higher  Following upper endoscopy (EGD)  Vomiting of blood or coffee ground material  New chest pain or pain under the shoulder blades  Painful or persistently difficult swallowing  New shortness of breath  Fever of 100F or higher  Black, tarry-looking stools  FOLLOW UP: If any biopsies were taken you will be contacted by phone or by letter within the next 1-3 weeks.  Call your gastroenterologist if you have not heard about the biopsies in 3 weeks.  Our staff will call the home number listed on your records the next business day following your procedure to check on you and address any questions or concerns that you may have at that time regarding the information given to you following your procedure. This is a courtesy call and so if there is no answer at the home number and we have not heard from you through the emergency physician on call, we will assume that you have  returned to your regular daily activities without incident.  SIGNATURES/CONFIDENTIALITY: You and/or your care partner have signed paperwork which will be entered into your electronic medical record.  These signatures attest to the fact that that the information above on your After Visit Summary has been reviewed and is understood.  Full responsibility of the confidentiality of this discharge information lies with you and/or your care-partner.

## 2013-02-14 NOTE — Op Note (Signed)
[facility] [address] [place], [address]   ENDOSCOPY PROCEDURE REPORT  PATIENT: Amy Marks, Amy Marks  MR#: [id number] BIRTHDATE: [birth year] , 41  yrs. old GENDER: Female ENDOSCOPIST: Meryl Dare, MD, [facility] REFERRED BY:  Juleen China, M.D. PROCEDURE DATE:  02/14/2013 PROCEDURE:  EGD w/ biopsy ASA CLASS:     Class II INDICATIONS:  diarrhea, elevated antigliadin Ab. MEDICATIONS: residual sedation effect present from prior procedure, MAC sedation, administered by CRNA, propofol (Diprivan) 300mg  IV TOPICAL ANESTHETIC: none DESCRIPTION OF PROCEDURE: After the risks benefits and alternatives of the procedure were thoroughly explained, informed consent was obtained.  The LB GIF-H180 [id number] endoscope was introduced through the mouth and advanced to the second portion of the duodenum without limitations.  The instrument was slowly withdrawn as the mucosa was fully examined.  DUODENUM: The duodenal mucosa showed no abnormalities in the bulb and second portion of the duodenum.  Cold forceps biopsies were taken in the bulb and second portion. STOMACH: The mucosa and folds of the stomach appeared normal. ESOPHAGUS: The mucosa of the esophagus appeared normal.  Retroflexed views revealed no abnormalities.   The scope was then withdrawn from the patient and the procedure completed.  COMPLICATIONS: There were no complications.  ENDOSCOPIC IMPRESSION: 1.   The EGD appeared normal  RECOMMENDATIONS: 1.  Await pathology results   eSigned:  Meryl Dare, MD, [facility] 02/14/2013 3:07 PM

## 2013-02-15 ENCOUNTER — Telehealth: Payer: Self-pay

## 2013-02-15 NOTE — Telephone Encounter (Signed)
Left message on answering machine. 

## 2013-02-18 ENCOUNTER — Encounter: Payer: Self-pay | Admitting: Family Medicine

## 2013-02-18 ENCOUNTER — Encounter: Payer: Self-pay | Admitting: Gastroenterology

## 2013-02-18 ENCOUNTER — Ambulatory Visit (INDEPENDENT_AMBULATORY_CARE_PROVIDER_SITE_OTHER): Admitting: Family Medicine

## 2013-02-18 VITALS — BP 110/70 | HR 70 | Temp 98.0°F | Wt 189.0 lb

## 2013-02-18 DIAGNOSIS — H9209 Otalgia, unspecified ear: Secondary | ICD-10-CM

## 2013-02-18 DIAGNOSIS — H9202 Otalgia, left ear: Secondary | ICD-10-CM | POA: Insufficient documentation

## 2013-02-18 NOTE — Patient Instructions (Addendum)
Great to see you. Your ear looks great. Try sudafed (behind the counter)- follow directions on box.  Take for 24-48 hours.  Have [place] the beach.  Call us Monday if no better.

## 2013-02-18 NOTE — Progress Notes (Signed)
42 yo pleasant female pt of Dr. Patsy Lager here for persistent left ear pain.  Notes reviewed.   Went to the ER at [facility] several weeks ago with left ear pain and placed on Amoxicillin 500 mg TID. Followed up with PCP on 02/03/2013 because of persistent ear and left jaw pain.    Dr. Patsy Lager switched her abx to [facility] and advised continued auralgan.  She finished abx last Saturday.  Here today because still feels like her ear is "clogged."  Not really having pain unless she sneezes.  Patient Active Problem List   Diagnosis Date Noted  . Left ear pain 02/18/2013  . Chronic diarrhea 01/21/2013  . Carpal tunnel syndrome, bilateral 12/01/2011  . ANXIETY STATE, UNSPECIFIED 01/02/2011  . FATIGUE 03/28/2010  . DISORDERS, OBSESSIVE-COMPULSIVE 03/26/2007    Past Medical History  Diagnosis Date  . OCD (obsessive compulsive disorder)   . Anxiety     Past Surgical History  Procedure Laterality Date  . Tonsillectomy    . Nasal septum surgery    . Dilitation & currettage/hystroscopy with novasure ablation  09/03/2012    Procedure: DILATATION & CURETTAGE/HYSTEROSCOPY WITH NOVASURE ABLATION;  Surgeon: Loney Laurence, MD;  Location: [facility];  Service: Gynecology;  Laterality: N/A;  . Laparoscopic tubal ligation  09/03/2012    Procedure: LAPAROSCOPIC TUBAL LIGATION;  Surgeon: Loney Laurence, MD;  Location: [facility];  Service: Gynecology;  Laterality: Bilateral;  FILSHIE CLIPS  . Tubal ligation    . Laparoscopy  10/14/2012    Procedure: LAPAROSCOPY OPERATIVE;  Surgeon: Freddrick March. Tenny Craw, MD;  Location: [facility];  Service: Gynecology;  Laterality: N/A;  . Laparotomy  10/14/2012    Procedure: EXPLORATORY LAPAROTOMY;  Surgeon: Freddrick March. Tenny Craw, MD;  Location: [facility];  Service: Gynecology;  Laterality: N/A;    History   Social History  . Marital Status: Married    Spouse Name: N/A    Number of Children: 2  . Years of Education: N/A   Occupational History  . DENTAL HYGIENST    Social History Main  Topics  . Smoking status: Former Smoker    Quit date: 08/31/2010  . Smokeless tobacco: Never Used  . Alcohol Use: Yes     Comment: rare  . Drug Use: No  . Sexually Active: Not on file   Other Topics Concern  . Not on file   Social History Narrative  . No narrative on file    Family History  Problem Relation Age of Onset  . Depression Mother   . Fibromyalgia Mother   . Breast cancer Mother   . Alcohol abuse Father   . Emphysema Maternal Grandmother   . Breast cancer Maternal Grandmother   . Multiple sclerosis Maternal Grandfather   . Breast cancer Paternal Aunt     No Known Allergies  Medication list has been reviewed and updated.  Outpatient Prescriptions Prior to Visit  Medication Sig Dispense Refill  . antipyrine-benzocaine (AURALGAN) otic solution 3 drops every 2 (two) hours as needed for pain.      Marland Kitchen HYDROcodone-acetaminophen (NORCO/VICODIN) 5-325 MG per tablet Take 1 tablet by mouth every 6 (six) hours as needed for pain.      Marland Kitchen ibuprofen (ADVIL,MOTRIN) 800 MG tablet Take 800 mg by mouth every 8 (eight) hours as needed for pain.      Marland Kitchen sertraline (ZOLOFT) 50 MG tablet TAKE 3 TABLETS (150 MG) DAILY  270 tablet  0   No facility-administered medications prior to visit.    Review  of Systems:  ROS: See HPI  Physical Examination: BP 110/70  Pulse 70  Temp(Src) 98 F (36.7 C)  Wt 189 lb (85.73 kg)  BMI 33.49 kg/m2  LMP 01/31/2013   Gen: WDWN, NAD; A & O x3, cooperative. Pleasant.Globally Non-toxic HEENT: Normocephalic and atraumatic. Throat clear, w/o exudate, R TM clear, Left TM:  Normal TM, no erythema, mild amount of fluid otherwise unremarkable.  MMM No PND NECK: Anterior cervical  LAD is mildly present CV: RRR, No M/G/R, cap refill <2 sec PULM: Breathing comfortably in no respiratory distress. no wheezing, crackles, rhonchi EXT: No c/c/e PSYCH: Friendly, good eye contact MSK: Nml gait    Assessment and Plan:  1. Left ear pain Otitis  resolving.  Ear actually looks great.  Advised supportive care with Sudafed for next 24-48 hours.  She will call on Monday if no improvement. The patient indicates understanding of these issues and agrees with the plan.

## 2013-05-30 ENCOUNTER — Telehealth: Payer: Self-pay

## 2013-05-30 DIAGNOSIS — F429 Obsessive-compulsive disorder, unspecified: Secondary | ICD-10-CM

## 2013-05-30 DIAGNOSIS — F411 Generalized anxiety disorder: Secondary | ICD-10-CM

## 2013-05-30 NOTE — Telephone Encounter (Signed)
Pt request referral to [facility], counselors in [facility] fax # [phone] and phone # [phone]. Pt request marriage counseling and does require referral from PCP; pt was first seen with [facility] on 05/27/13 and scheduled next visit at 06/03/13. Pt request cb.

## 2013-05-31 NOTE — Telephone Encounter (Signed)
Spoke with patient, she needs referral done before her appt with counselor on Friday.

## 2013-06-27 ENCOUNTER — Ambulatory Visit (INDEPENDENT_AMBULATORY_CARE_PROVIDER_SITE_OTHER): Admitting: Family Medicine

## 2013-06-27 ENCOUNTER — Encounter: Payer: Self-pay | Admitting: Family Medicine

## 2013-06-27 VITALS — BP 100/72 | HR 72 | Temp 98.2°F | Ht 63.0 in | Wt 192.5 lb

## 2013-06-27 DIAGNOSIS — F32A Depression, unspecified: Secondary | ICD-10-CM

## 2013-06-27 DIAGNOSIS — F339 Major depressive disorder, recurrent, unspecified: Secondary | ICD-10-CM | POA: Insufficient documentation

## 2013-06-27 DIAGNOSIS — F329 Major depressive disorder, single episode, unspecified: Secondary | ICD-10-CM

## 2013-06-27 DIAGNOSIS — T1592XA Foreign body on external eye, part unspecified, left eye, initial encounter: Secondary | ICD-10-CM

## 2013-06-27 DIAGNOSIS — T1590XA Foreign body on external eye, part unspecified, unspecified eye, initial encounter: Secondary | ICD-10-CM

## 2013-06-27 DIAGNOSIS — Z23 Encounter for immunization: Secondary | ICD-10-CM

## 2013-06-27 MED ORDER — SERTRALINE HCL 100 MG PO TABS: 200.0000 mg | ORAL_TABLET | Freq: Every day | ORAL | Status: DC

## 2013-06-27 NOTE — Patient Instructions (Addendum)
F/u 6 weeks  REFERRAL: GO THE THE FRONT ROOM AT THE ENTRANCE OF OUR CLINIC, NEAR CHECK IN. ASK FOR MARION. SHE WILL HELP YOU SET UP YOUR REFERRAL. DATE: TIME: 

## 2013-06-27 NOTE — Progress Notes (Signed)
Nature conservation officer at [facility] [address] [place] [address] Phone: [phone] Fax: [phone]  Date:  06/27/2013   Name:  Amy Marks   DOB:  Jan 22, 1971   MRN:  [id number] Gender: female Age: 42 y.o.  Primary Physician:  Hannah Beat, MD  Evaluating MD: Hannah Beat, MD   Chief Complaint: Medication Refill and Spot on left eye   History of Present Illness:  Amy Marks is a 42 y.o. pleasant patient who presents with the following:  Left eye:  Spot on cornea. It has been there approximately 2 or 3 months. She does not recall any specific trauma or injury. It has not been painful. It is not moved or changed at all during that time.  She does not wear any contacts.  Impression: She is also been more depressed. She has been having some difficulty with her husband and they are going through counseling right now. She has been crying. Decreased interest. Decreased energy. Sleeping more. No suicidality or homicidality.  Patient Active Problem List   Diagnosis Date Noted  . Chronic diarrhea 01/21/2013  . Carpal tunnel syndrome, bilateral 12/01/2011  . ANXIETY STATE, UNSPECIFIED 01/02/2011  . FATIGUE 03/28/2010  . DISORDERS, OBSESSIVE-COMPULSIVE 03/26/2007    Past Medical History  Diagnosis Date  . OCD (obsessive compulsive disorder)   . Anxiety     Past Surgical History  Procedure Laterality Date  . Tonsillectomy    . Nasal septum surgery    . Dilitation & currettage/hystroscopy with novasure ablation  09/03/2012    Procedure: DILATATION & CURETTAGE/HYSTEROSCOPY WITH NOVASURE ABLATION;  Surgeon: Loney Laurence, MD;  Location: [facility];  Service: Gynecology;  Laterality: N/A;  . Laparoscopic tubal ligation  09/03/2012    Procedure: LAPAROSCOPIC TUBAL LIGATION;  Surgeon: Loney Laurence, MD;  Location: [facility];  Service: Gynecology;  Laterality: Bilateral;  FILSHIE CLIPS  . Tubal ligation    . Laparoscopy  10/14/2012    Procedure: LAPAROSCOPY  OPERATIVE;  Surgeon: Freddrick March. Tenny Craw, MD;  Location: [facility];  Service: Gynecology;  Laterality: N/A;  . Laparotomy  10/14/2012    Procedure: EXPLORATORY LAPAROTOMY;  Surgeon: Freddrick March. Tenny Craw, MD;  Location: [facility];  Service: Gynecology;  Laterality: N/A;    History   Social History  . Marital Status: Married    Spouse Name: N/A    Number of Children: 2  . 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 Smoking status: Former Smoker    Quit date: 08/31/2010  . Smokeless tobacco: Never Used  . Alcohol Use: Yes     Comment: rare  . Drug Use: No  . Sexual Activity: Not on file   Other Topics Concern  . Not on file   Social History Narrative  . No narrative on file    Family History  Problem Relation Age of Onset  . Depression Mother   . Fibromyalgia Mother   . Breast cancer Mother   . Alcohol abuse Father   . Emphysema Maternal Grandmother   . Breast cancer Maternal Grandmother   . Multiple sclerosis Maternal Grandfather   . Breast cancer Paternal Aunt     No Known Allergies  Medication list has been reviewed and updated.  Outpatient Prescriptions Prior to Visit  Medication Sig Dispense Refill  . sertraline (ZOLOFT) 50 MG tablet TAKE 3 TABLETS (150 MG) DAILY  270 tablet  0  . ibuprofen (  ADVIL,MOTRIN) 800 MG tablet Take 800 mg by mouth every 8 (eight) hours as needed for pain.       No facility-administered medications prior to visit.    Review of Systems:  As above. Some depression and anxiety. No chest pain or shortness of breath. She has had some intermittent sweaty palms. No sob.  Physical Examination: BP 100/72  Pulse 72  Temp(Src) 98.2 F (36.8 C) (Oral)  Ht 5\' 3"  (1.6 m)  Wt 192 lb 8 oz (87.317 kg)  BMI 34.11 kg/m2  LMP 06/20/2013  Ideal Body Weight: Weight in (lb) to have BMI = 25: 140.8   GEN: WDWN, NAD, Non-toxic, A & O x 3 HEENT: Atraumatic, Normocephalic. Neck supple. No masses, No LAD.  PERRLA. L cornea near conjunctiva there is a dark spot. Ears and Nose: No external deformity. CV: RRR, No M/G/R. No JVD. No thrill. No extra heart sounds. PULM: CTA B, no wheezes, crackles, rhonchi. No retractions. No resp. distress. No accessory muscle use. EXTR: No c/c/e NEURO Normal gait.  PSYCH: Normally interactive. Conversant. Not depressed or anxious appearing.  Calm demeanor.    Fluoroscein used, and no apparent corneal abrasion.  Assessment and Plan:  Foreign body, eye, left, initial encounter - Plan: Ambulatory referral to Ophthalmology  Need for prophylactic vaccination and inoculation against influenza - Plan: Flu Vaccine QUAD 36+ mos IM  Depression  Concern for ? FB, piece of metal or other on cornea. Consult opthalmology.  Dep worsened, increase zoloft, cont. Therapy, recheck 5 weekks  Orders Today:  Orders Placed This Encounter  Procedures  . Flu Vaccine QUAD 36+ mos IM  . Ambulatory referral to Ophthalmology    Referral Priority:  Routine    Referral Type:  Consultation    Referral Reason:  Specialty Services Required    Requested Specialty:  Ophthalmology    Number of Visits Requested:  1    Updated Medication List: (Includes new medications, updates to list, dose adjustments) Meds ordered this encounter  Medications  . sertraline (ZOLOFT) 100 MG tablet    Sig: Take 2 tablets (200 mg total) by mouth daily.    Dispense:  180 tablet    Refill:  3    Medications Discontinued: Medications Discontinued During This Encounter  Medication Reason  . sertraline (ZOLOFT) 50 MG tablet Reorder      Signed, Karleen Hampshire T. Joshia Kitchings, MD 06/27/2013 11:25 AM

## 2013-08-08 ENCOUNTER — Ambulatory Visit: Admitting: Family Medicine

## 2013-08-30 ENCOUNTER — Encounter: Payer: Self-pay | Admitting: Internal Medicine

## 2013-08-30 ENCOUNTER — Ambulatory Visit (INDEPENDENT_AMBULATORY_CARE_PROVIDER_SITE_OTHER): Admitting: Internal Medicine

## 2013-08-30 VITALS — BP 108/60 | HR 81 | Temp 98.0°F | Wt 195.0 lb

## 2013-08-30 DIAGNOSIS — H5789 Other specified disorders of eye and adnexa: Secondary | ICD-10-CM

## 2013-08-30 DIAGNOSIS — T783XXA Angioneurotic edema, initial encounter: Secondary | ICD-10-CM

## 2013-08-30 MED ORDER — PREDNISONE 10 MG PO TABS: ORAL_TABLET | ORAL | Status: DC

## 2013-08-30 NOTE — Progress Notes (Signed)
Subjective:    Patient ID: Amy Marks, female    DOB: 17-May-1971, 42 y.o.   MRN: [id number]  HPI  Pt presents to the clinic today with c/o eye irritation. This started about 2-3 weeks. It does go away and then come back. The symptoms last about 3-5 days before it resolves. She describes it as swelling, redness, itching and burning. She has not used any new products. She denies blurred vision or headache. She has never had this problem before.  Review of Systems      Past Medical History  Diagnosis Date  . OCD (obsessive compulsive disorder)   . Anxiety     Current Outpatient Prescriptions  Medication Sig Dispense Refill  . ibuprofen (ADVIL,MOTRIN) 800 MG tablet Take 800 mg by mouth every 8 (eight) hours as needed for pain.      Marland Kitchen sertraline (ZOLOFT) 100 MG tablet Take 2 tablets (200 mg total) by mouth daily.  180 tablet  3   No current facility-administered medications for this visit.    No Known Allergies  Family History  Problem Relation Age of Onset  . Depression Mother   . Fibromyalgia Mother   . Breast cancer Mother   . Alcohol abuse Father   . Emphysema Maternal Grandmother   . Breast cancer Maternal Grandmother   . Multiple sclerosis Maternal Grandfather   . Breast cancer Paternal Aunt     History   Social History  . Marital Status: Married    Spouse Name: N/A    Number of Children: 2  . 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 Smoking status: Former Smoker    Quit date: 08/31/2010  . Smokeless tobacco: Never Used  . Alcohol Use: Yes     Comment: rare  . Drug Use: No  . Sexual Activity: Not on file   Other Topics Concern  . Not on file   Social History Narrative  . No narrative on file     Constitutional: Denies fever, malaise, fatigue, headache or abrupt weight changes.  HEENT: Denies ear pain, ringing in the ears, wax buildup, runny nose, nasal congestion, bloody nose, or sore  throat.  No other specific complaints in a complete review of systems (except as listed in HPI above).  Objective:   Physical Exam  BP 108/60  Pulse 81  Temp(Src) 98 F (36.7 C) (Tympanic)  Wt 195 lb (88.451 kg)  SpO2 98% Wt Readings from Last 3 Encounters:  08/30/13 195 lb (88.451 kg)  06/27/13 192 lb 8 oz (87.317 kg)  02/18/13 189 lb (85.73 kg)    General: Appears her stated age, well developed, well nourished in NAD. Skin: Warm, dry and intact. No rashes, lesions or ulcerations noted. HEENT: Head: normal shape and size; Eyes: sclera white, no icterus, conjunctiva pink, PERRLA and EOMs intact; Mild swelling noted around bilateral eyes. Ears: Tm's gray and intact, normal light reflex; Nose: mucosa pink and moist, septum midline; Throat/Mouth: Teeth present, mucosa pink and moist, no exudate, lesions or ulcerations noted.  Neck: Normal range of motion. Neck supple, trachea midline. No massses, lumps or thyromegaly present.  Cardiovascular: Normal rate and rhythm. S1,S2 noted.  No murmur, rubs or gallops noted. No JVD or BLE edema. No carotid bruits noted. Pulmonary/Chest: Normal effort and positive vesicular breath sounds. No respiratory distress. No wheezes, rales or ronchi noted.    BMET    Component Value Date/Time  NA 138 01/21/2013 1000   K 4.6 01/21/2013 1000   CL 104 01/21/2013 1000   CO2 27 01/21/2013 1000   GLUCOSE 85 01/21/2013 1000   BUN 16 01/21/2013 1000   CREATININE 0.8 01/21/2013 1000   CALCIUM 9.3 01/21/2013 1000   GFRNONAA >90 10/14/2012 1249   GFRAA >90 10/14/2012 1249    Lipid Panel  No results found for this basename: chol, trig, hdl, cholhdl, vldl, ldlcalc    CBC    Component Value Date/Time   WBC 4.3* 01/21/2013 1000   RBC 4.29 01/21/2013 1000   HGB 12.9 01/21/2013 1000   HCT 38.1 01/21/2013 1000   PLT 169.0 01/21/2013 1000   MCV 88.8 01/21/2013 1000   MCH 30.2 10/14/2012 1249   MCHC 34.0 01/21/2013 1000   RDW 12.8 01/21/2013 1000   LYMPHSABS 1.3 01/21/2013 1000    MONOABS 0.3 01/21/2013 1000   EOSABS 0.1 01/21/2013 1000   BASOSABS 0.0 01/21/2013 1000    Hgb A1C No results found for this basename: HGBA1C         Assessment & Plan:   Allergic angioedema of bilateral eyes:  Cool compresses for comfort eRx for Pred taper x 6 days Take an OTC antihistamine such as allegra or zyrtec If does not improve, will refer to allergy for further evaluation  If you develop new or worsening symptoms, difficulty breathing or shortness of breath, RTC or nearest ED immediately

## 2013-08-30 NOTE — Patient Instructions (Signed)
Angioedema Angioedema (AE) is a sudden swelling of the eyelids, lips, lobes of ears, external genitalia, skin, and other parts of the body. AE can happen by itself. It usually begins during the night and is found on awakening. It can happen with hives and other allergic reactions. Attacks can be mild and annoying, or life-threatening if the air passages swell. AE generally occurs in a short time period (over minutes to hours) and gets better in 24 to 48 hours. It usually does not cause any serious problems.  There are 2 different kinds of AE:   Allergic AE.  Nonallergic AE.  There may be an overreaction or direct stimulation of cells that are a part of the immune system (mast cells).  There may be problems with the release of chemicals made by the body that cause swelling and inflammation (kinins). AE due to kinins can be inherited from parents (hereditary), or it can develop on its own (acquired). Acquired AE either shows up before, or along with, certain diseases or is due to the body's immune system attacking parts of the body's own cells (autoimmune). CAUSES  Allergic  AE due to allergic reactions are caused by something that causes the body to react (trigger). Common triggers include:  Foods.  Medicines.  Latex.  Direct contact with certain fruits, vegetables, or animal saliva.  Insect stings. Nonallergic  Mast cell stimulation may be caused by:  Medicines.  Dyes used in X-rays.  The body's own immune system reactions to parts of the body (autoimmune disease).  Possibly, some virus infections.  AE due to problems with kinins can be hereditary or acquired. Attacks are triggered by:  Mild injury.  Dental work or any surgery.  Stress.  Sudden changes in temperature.  Exercise.  Medicines.  AE due to problems with kinins can also be due to certain medicines, especially blood pressure medicines like angiotensin-converting enzyme (ACE) inhibitors. African Americans  are at nearly 5 times greater risk of developing AE than Caucasians from ACE inhibitors. SYMPTOMS  Allergic symptoms:  Non-itchy swelling of the skin. Often the swelling is on the face and lips, but any area of the skin can swell. Sometimes, the swelling can be painful. If hives are present, there is intense itching.  Breathing problems if the air passages swell. Nonallergic symptoms:  If internal organs are involved, there may be:  Nausea.  Abdominal pain.  Vomiting.  Difficulty swallowing.  Difficulty passing urine.  Breathing problems if the air passages swell. Depending on the cause of AE, episodes may:  Only happen once (if triggers are removed or avoided).  Come back in unpredictable patterns.  Repeat for several years and then gradually fade away. DIAGNOSIS  AE is diagnosed by:   Asking questions to find out how fast the symptoms began.  Taking a family history.  Physical exam.  Diagnostic tests. Tests could include:  Allergy skin tests to see if the problem is allergic.  Blood tests to diagnose hereditary and some acquired types of AE.  Other tests to see if there is a hidden disease leading to the AE. TREATMENT  Treatment depends on the type and cause (if any) of the AE. Allergic  Allergic types of AE are treated with:  Immediate removal of the trigger or medicine (if any).  Epinephrine injection.  Steroids.  Antihistamines.  Hospitalization for severe attacks. Nonallergic  Mast cell stimulation types of AE are treated with:  Immediate removal of the trigger or medicine (if any).  Epinephrine injection.    Steroids.  Antihistamines.  Hospitalization for severe attacks.  Hereditary AE is treated with:  Medicines to prevent and treat attacks. There is little response to antihistamines, epinephrine, or steroids.  Preventive medicines before dental work or surgery.  Removing or avoiding medicines that trigger  attacks.  Hospitalization for severe attacks.  Acquired AE is treated with:  Treating underlying disease (if any).  Medicines to prevent and treat attacks. HOME CARE INSTRUCTIONS   Always carry your emergency allergy treatment medicines with you.  Wear a medical bracelet.  Avoid known triggers. SEEK MEDICAL CARE IF:   You get repeat attacks.  Your attacks are more frequent or more severe despite preventive measures.  You have hereditary AE and are considering having children. It is important to discuss the risks of passing this on to your children. SEEK IMMEDIATE MEDICAL CARE IF:   You have difficulty breathing.  You have difficulty swallowing.  You experience fainting. This condition should be treated immediately. It can be life-threatening if it involves throat swelling. Document Released: 12/15/2001 Document Revised: 12/29/2011 Document Reviewed: 05/30/2013 Exit[employer] Patient Information 2014 [employer].  

## 2013-09-12 ENCOUNTER — Encounter: Payer: Self-pay | Admitting: Family Medicine

## 2013-09-12 ENCOUNTER — Ambulatory Visit (INDEPENDENT_AMBULATORY_CARE_PROVIDER_SITE_OTHER): Admitting: Family Medicine

## 2013-09-12 VITALS — BP 96/64 | HR 69 | Temp 98.4°F | Ht 63.0 in | Wt 190.8 lb

## 2013-09-12 DIAGNOSIS — F4010 Social phobia, unspecified: Secondary | ICD-10-CM

## 2013-09-12 DIAGNOSIS — F339 Major depressive disorder, recurrent, unspecified: Secondary | ICD-10-CM

## 2013-09-12 HISTORY — DX: Social phobia, unspecified: F40.10

## 2013-09-12 MED ORDER — PROPRANOLOL HCL 10 MG PO TABS: ORAL_TABLET | ORAL | Status: DC

## 2013-09-12 NOTE — Progress Notes (Signed)
>  25 minutes spent in face to face time with patient, >50% spent in counselling or coordination of care: She does feel like she is doing compared to our previous visit significantly better from a depression standpoint. She is still occasionally mildly depressed. She is likely going to split up from her husband, which does cause some sadness. She also has been having some recurrent anxiety, particularly with going to social amounts for some time. She also has difficulty with going to the store in the mall. Her psychologist discussed with her that maybe she should talk with me about potentially going on a benzodiazepine. We discussed all the potential risks and benefits, and ultimately we decided together that this route once.ideal, and she is going to try using some Inderal prior to social engagements. If this does not work, then I would think about using some BuSpar.  Does not like to be around others and having some social anxiety. Anxious and worried about that. Work Engineer, site.   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 Sometimes on the way to work.  Shopping, concerts, anything big. Going out of town.    Major depressive disorder, recurrent  Social anxiety disorder  There are no Patient Instructions on file for this visit.  Orders Today:  No orders of the defined types were placed in this encounter.    New medications, updates to list, dose adjustments: Meds ordered this encounter  Medications  . doxycycline (VIBRAMYCIN) 100 MG capsule    Sig: Take 100 mg by mouth daily.  . propranolol (INDERAL) 10 MG tablet    Sig: 1 - 2 tablets 30 minutes before social situation    Dispense:  60 tablet    Refill:  5    Signed,  Haillee Johann T. Viveka Wilmeth, MD, CAQ Sports Medicine  [facility] [address] [place] [address] Phone: [phone] Fax: [phone]  Updated Complete Medication List:   Medication List       This list is accurate as of: 09/12/13 10:29 AM.  Always use  your most recent med list.               doxycycline 100 MG capsule  Commonly known as:  VIBRAMYCIN  Take 100 mg by mouth daily.     ibuprofen 800 MG tablet  Commonly known as:  ADVIL,MOTRIN  Take 800 mg by mouth every 8 (eight) hours as needed for pain.     propranolol 10 MG tablet  Commonly known as:  INDERAL  1 - 2 tablets 30 minutes before social situation     sertraline 100 MG tablet  Commonly known as:  ZOLOFT  Take 2 tablets (200 mg total) by mouth daily.

## 2013-09-12 NOTE — Progress Notes (Signed)
Pre-visit discussion using our clinic review tool. No additional management support is needed unless otherwise documented below in the visit note.  

## 2014-02-07 ENCOUNTER — Encounter: Payer: Self-pay | Admitting: Internal Medicine

## 2014-02-07 ENCOUNTER — Ambulatory Visit (INDEPENDENT_AMBULATORY_CARE_PROVIDER_SITE_OTHER): Admitting: Internal Medicine

## 2014-02-07 VITALS — BP 116/74 | HR 72 | Temp 98.0°F | Wt 185.5 lb

## 2014-02-07 DIAGNOSIS — T148XXA Other injury of unspecified body region, initial encounter: Secondary | ICD-10-CM

## 2014-02-07 DIAGNOSIS — M25512 Pain in left shoulder: Secondary | ICD-10-CM

## 2014-02-07 DIAGNOSIS — M25519 Pain in unspecified shoulder: Secondary | ICD-10-CM

## 2014-02-07 MED ORDER — CYCLOBENZAPRINE HCL 10 MG PO TABS: 10.0000 mg | ORAL_TABLET | Freq: Three times a day (TID) | ORAL | Status: DC | PRN

## 2014-02-07 NOTE — Progress Notes (Signed)
Subjective:    Patient ID: Amy Marks, female    DOB: 04/24/1971, 43 y.o.   MRN: [id number]  HPI  Pt presents to the clinic today with c/o left shoulder and right lower back pain. She reports this started about 3 weeks ago. She noticed after trying to move a big picnic table. The pain does not radiate into her legs. She has tried a heating pad and Ibuprofen OTC with some relief.  Review of Systems      Past Medical History  Diagnosis Date  . OCD (obsessive compulsive disorder)   . Anxiety   . Social anxiety disorder 09/12/2013    Current Outpatient Prescriptions  Medication Sig Dispense Refill  . doxycycline (VIBRAMYCIN) 100 MG capsule Take 100 mg by mouth daily.      Marland Kitchen ibuprofen (ADVIL,MOTRIN) 800 MG tablet Take 800 mg by mouth every 8 (eight) hours as needed for pain.      Marland Kitchen propranolol (INDERAL) 10 MG tablet 1 - 2 tablets 30 minutes before social situation  60 tablet  5  . sertraline (ZOLOFT) 100 MG tablet Take 2 tablets (200 mg total) by mouth daily.  180 tablet  3   No current facility-administered medications for this visit.    No Known Allergies  Family History  Problem Relation Age of Onset  . Depression Mother   . Fibromyalgia Mother   . Breast cancer Mother   . Alcohol abuse Father   . Emphysema Maternal Grandmother   . Breast cancer Maternal Grandmother   . Multiple sclerosis Maternal Grandfather   . Breast cancer Paternal Aunt     History   Social History  . Marital Status: Married    Spouse Name: N/A    Number of Children: 2  . 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 Smoking status: Former Smoker    Quit date: 08/31/2010  . Smokeless tobacco: Never Used  . Alcohol Use: Yes     Comment: rare  . Drug Use: No  . Sexual Activity: Not on file   Other Topics Concern  . Not on file   Social History Narrative  . No narrative on file     Constitutional: Denies fever, malaise,  fatigue, headache or abrupt weight changes.  Musculoskeletal: Pt reports shoulder and back pain. Denies decrease in range of motion, difficulty with gait, or joint pain and swelling.    No other specific complaints in a complete review of systems (except as listed in HPI above).  Objective:   Physical Exam   BP 116/74  Pulse 72  Temp(Src) 98 F (36.7 C) (Oral)  Wt 185 lb 8 oz (84.142 kg)  SpO2 98% Wt Readings from Last 3 Encounters:  02/07/14 185 lb 8 oz (84.142 kg)  09/12/13 190 lb 12 oz (86.524 kg)  08/30/13 195 lb (88.451 kg)    General: Appears her stated age, well developed, well nourished in NAD. Cardiovascular: Normal rate and rhythm. S1,S2 noted.  No murmur, rubs or gallops noted. No JVD or BLE edema. No carotid bruits noted. Pulmonary/Chest: Normal effort and positive vesicular breath sounds. No respiratory distress. No wheezes, rales or ronchi noted.  Musculoskeletal:  normal internal and external rotation of the left shoulder. Strength normal in BUE. Pain with palpation just below the left shoulder blade and right lower back.   BMET    Component Value Date/Time   NA 138 01/21/2013 1000  K 4.6 01/21/2013 1000   CL 104 01/21/2013 1000   CO2 27 01/21/2013 1000   GLUCOSE 85 01/21/2013 1000   BUN 16 01/21/2013 1000   CREATININE 0.8 01/21/2013 1000   CALCIUM 9.3 01/21/2013 1000   GFRNONAA >90 10/14/2012 1249   GFRAA >90 10/14/2012 1249    Lipid Panel  No results found for this basename: chol, trig, hdl, cholhdl, vldl, ldlcalc    CBC    Component Value Date/Time   WBC 4.3* 01/21/2013 1000   RBC 4.29 01/21/2013 1000   HGB 12.9 01/21/2013 1000   HCT 38.1 01/21/2013 1000   PLT 169.0 01/21/2013 1000   MCV 88.8 01/21/2013 1000   MCH 30.2 10/14/2012 1249   MCHC 34.0 01/21/2013 1000   RDW 12.8 01/21/2013 1000   LYMPHSABS 1.3 01/21/2013 1000   MONOABS 0.3 01/21/2013 1000   EOSABS 0.1 01/21/2013 1000   BASOSABS 0.0 01/21/2013 1000    Hgb A1C No results found for this basename: HGBA1C          Assessment & Plan:   Muscle strain of lower back and left shoulder:  Continue heat and ibuprofen Stretching exercises given eRx for flexeril TID prn  RTC as needed or if symptoms persist or worsen

## 2014-02-07 NOTE — Patient Instructions (Addendum)
Back Exercises These exercises may help you when beginning to rehabilitate your injury. Your symptoms may resolve with or without further involvement from your physician, physical therapist or athletic trainer. While completing these exercises, remember:   Restoring tissue flexibility helps normal motion to return to the joints. This allows healthier, less painful movement and activity.  An effective stretch should be held for at least 30 seconds.  A stretch should never be painful. You should only feel a gentle lengthening or release in the stretched tissue. STRETCH  Extension, Prone on Elbows   Lie on your stomach on the floor, a bed will be too soft. Place your palms about shoulder width apart and at the height of your head.  Place your elbows under your shoulders. If this is too painful, stack pillows under your chest.  Allow your body to relax so that your hips drop lower and make contact more completely with the floor.  Hold this position for __________ seconds.  Slowly return to lying flat on the floor. Repeat __________ times. Complete this exercise __________ times per day.  RANGE OF MOTION  Extension, Prone Press Ups   Lie on your stomach on the floor, a bed will be too soft. Place your palms about shoulder width apart and at the height of your head.  Keeping your back as relaxed as possible, slowly straighten your elbows while keeping your hips on the floor. You may adjust the placement of your hands to maximize your comfort. As you gain motion, your hands will come more underneath your shoulders.  Hold this position __________ seconds.  Slowly return to lying flat on the floor. Repeat __________ times. Complete this exercise __________ times per day.  RANGE OF MOTION- Quadruped, Neutral Spine   Assume a hands and knees position on a firm surface. Keep your hands under your shoulders and your knees under your hips. You may place padding under your knees for comfort.  Drop  your head and point your tail bone toward the ground below you. This will round out your low back like an angry cat. Hold this position for __________ seconds.  Slowly lift your head and release your tail bone so that your back sags into a large arch, like an old horse.  Hold this position for __________ seconds.  Repeat this until you feel limber in your low back.  Now, find your "sweet spot." This will be the most comfortable position somewhere between the two previous positions. This is your neutral spine. Once you have found this position, tense your stomach muscles to support your low back.  Hold this position for __________ seconds. Repeat __________ times. Complete this exercise __________ times per day.  STRETCH  Flexion, Single Knee to Chest   Lie on a firm bed or floor with both legs extended in front of you.  Keeping one leg in contact with the floor, bring your opposite knee to your chest. Hold your leg in place by either grabbing behind your thigh or at your knee.  Pull until you feel a gentle stretch in your low back. Hold __________ seconds.  Slowly release your grasp and repeat the exercise with the opposite side. Repeat __________ times. Complete this exercise __________ times per day.  STRETCH - Hamstrings, Standing  Stand or sit and extend your right / left leg, placing your foot on a chair or foot stool  Keeping a slight arch in your low back and your hips straight forward.  Lead with your chest and   lean forward at the waist until you feel a gentle stretch in the back of your right / left knee or thigh. (When done correctly, this exercise requires leaning only a small distance.)  Hold this position for __________ seconds. Repeat __________ times. Complete this stretch __________ times per day. STRENGTHENING  Deep Abdominals, Pelvic Tilt   Lie on a firm bed or floor. Keeping your legs in front of you, bend your knees so they are both pointed toward the ceiling and  your feet are flat on the floor.  Tense your lower abdominal muscles to press your low back into the floor. This motion will rotate your pelvis so that your tail bone is scooping upwards rather than pointing at your feet or into the floor.  With a gentle tension and even breathing, hold this position for __________ seconds. Repeat __________ times. Complete this exercise __________ times per day.  STRENGTHENING  Abdominals, Crunches   Lie on a firm bed or floor. Keeping your legs in front of you, bend your knees so they are both pointed toward the ceiling and your feet are flat on the floor. Cross your arms over your chest.  Slightly tip your chin down without bending your neck.  Tense your abdominals and slowly lift your trunk high enough to just clear your shoulder blades. Lifting higher can put excessive stress on the low back and does not further strengthen your abdominal muscles.  Control your return to the starting position. Repeat __________ times. Complete this exercise __________ times per day.  STRENGTHENING  Quadruped, Opposite UE/LE Lift   Assume a hands and knees position on a firm surface. Keep your hands under your shoulders and your knees under your hips. You may place padding under your knees for comfort.  Find your neutral spine and gently tense your abdominal muscles so that you can maintain this position. Your shoulders and hips should form a rectangle that is parallel with the floor and is not twisted.  Keeping your trunk steady, lift your right hand no higher than your shoulder and then your left leg no higher than your hip. Make sure you are not holding your breath. Hold this position __________ seconds.  Continuing to keep your abdominal muscles tense and your back steady, slowly return to your starting position. Repeat with the opposite arm and leg. Repeat __________ times. Complete this exercise __________ times per day. Document Released: 10/24/2005 Document  Revised: 12/29/2011 Document Reviewed: 01/18/2009 [employer] Patient Information 2014 [employer].  

## 2014-02-07 NOTE — Progress Notes (Signed)
Pre visit review using our clinic review tool, if applicable. No additional management support is needed unless otherwise documented below in the visit note. 

## 2014-03-27 ENCOUNTER — Ambulatory Visit (INDEPENDENT_AMBULATORY_CARE_PROVIDER_SITE_OTHER): Admitting: Family Medicine

## 2014-03-27 ENCOUNTER — Encounter: Payer: Self-pay | Admitting: Family Medicine

## 2014-03-27 VITALS — BP 106/64 | HR 96 | Temp 98.6°F | Ht 63.0 in | Wt 184.5 lb

## 2014-03-27 DIAGNOSIS — M542 Cervicalgia: Secondary | ICD-10-CM

## 2014-03-27 DIAGNOSIS — M19019 Primary osteoarthritis, unspecified shoulder: Secondary | ICD-10-CM

## 2014-03-27 DIAGNOSIS — M719 Bursopathy, unspecified: Secondary | ICD-10-CM

## 2014-03-27 DIAGNOSIS — M259 Joint disorder, unspecified: Secondary | ICD-10-CM

## 2014-03-27 DIAGNOSIS — M6790 Unspecified disorder of synovium and tendon, unspecified site: Secondary | ICD-10-CM

## 2014-03-27 DIAGNOSIS — M67912 Unspecified disorder of synovium and tendon, left shoulder: Secondary | ICD-10-CM

## 2014-03-27 MED ORDER — DICLOFENAC SODIUM 75 MG PO TBEC: 75.0000 mg | DELAYED_RELEASE_TABLET | Freq: Two times a day (BID) | ORAL | Status: DC

## 2014-03-27 NOTE — Progress Notes (Signed)
[place] [address] Phone: [phone] Fax: [phone]  Patient ID: Amy Marks MRN: [id number], DOB: 1970/12/30, 43 y.o. Date of Encounter: 03/27/2014  Primary Physician:  Owens Loffler, MD   Chief Complaint: Shoulder Pain   Subjective:   History of Present Illness:  Amy Marks is a 43 y.o. very pleasant female patient who presents with the following:  Left shoulder pain:  Constant ache is just sitting. Abd is painful. Work does not hurt. Every now and again, shooting twinge - down up and down arm and back. She describes having some pain with abduction and occasionally with reaching around and internally rotating her shoulder on the left. She has a dull ache in her deltoid region, as well as posterior to this. She also has some pain in the t-shirt distribution. 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  She also has some mild posterior neck pain and pain in the shoulder blade region on the left. She also very rarely has been in since the last less than 10 seconds her she is very short, severe pain that is almost like a lightning strike.  inj l  Past Medical History, Surgical History, Social History, Family History, Problem List, Medications, and Allergies have been reviewed and updated if relevant.  Review of Systems:  GEN: No fevers, chills. Nontoxic. Primarily MSK c/o today. MSK: Detailed in the HPI GI: tolerating PO intake without difficulty Neuro: as above Otherwise the pertinent positives of the ROS are noted above.   Objective:   Physical Examination: BP 106/64  Pulse 96  Temp(Src) 98.6 F (37 C) (Oral)  Ht 5\' 3"  (1.6 m)  Wt 184 lb 8 oz (83.689 kg)  BMI 32.69 kg/m2   GEN: Well-developed,well-nourished,in no acute distress; alert,appropriate and cooperative throughout examination HEENT:  Normocephalic and atraumatic without obvious abnormalities. Ears, externally no deformities PULM: Breathing comfortably in no respiratory distress EXT: No clubbing, cyanosis, or edema PSYCH: Normally interactive. Cooperative during the interview. Pleasant. Friendly and conversant. Not anxious or depressed appearing. Normal, full affect.  CERVICAL SPINE EXAM Range of motion: Flexion, extension, lateral bending, and rotation: Relatively preserved with less than 10% loss of forward flexion and lateral bending on either side. Pain with terminal motion: Yes Spinous Processes: NT SCM: NT Upper paracervical muscles: Minor Upper traps: NT C5-T1 intact, sensation and motor   Left shoulder: Mildly tender at the a.c. joint. Nontender on the clavicle. Mildly tender in the bicipital groove. Strength testing is 5/5 throughout. Range of motion is full.  Speeds and Yergason's test are both mildly positive. If he can testing is mildly positive.  Neer testing is positive. Hawkins test is mildly positive.  Radiology: No results found.  Assessment & Plan:   Cervicalgia - Plan: Ambulatory referral to Physical Therapy  Tendinopathy of left rotator cuff - Plan: Ambulatory referral to Physical Therapy  AC joint arthropathy - Plan: Ambulatory referral to Physical Therapy  Think she likely has some involvement of both the neck and the shoulder joint. Rotator cuff tendinopathy, subacromial bursitis, as well as some mild bicipital tendinopathy and a.c. joint inflammation.  SubAC Injection, LEFT Verbal consent was obtained from the patient. Risks (including rare infection), benefits, and alternatives were explained. Patient prepped with Chloraprep and Ethyl Chloride used for anesthesia. The subacromial space was injected using the posterior approach.  The patient tolerated the procedure well and had decreased pain post injection. No complications. Injection: 8 cc of Lidocaine 1% and Depo-Medrol 80 mg. Needle:  22 gauge   New Prescriptions   DICLOFENAC (VOLTAREN) 75 MG EC TABLET    Take 1 tablet (75 mg total) by mouth 2 (two) times daily.   Modified Medications   No medications on file   Orders Placed This Encounter  Procedures  . Ambulatory referral to Physical Therapy   Follow-up: Return in about 8 weeks (around 05/22/2014). Unless noted above, the patient is to follow-up if symptoms worsen. Red flags were reviewed with the patient.  Signed,  Maud Deed. Sayvon Arterberry, MD, CAQ Sports Medicine   Discontinued Medications   CYCLOBENZAPRINE (FLEXERIL) 10 MG TABLET    Take 1 tablet (10 mg total) by mouth 3 (three) times daily as needed for muscle spasms.   Current Medications at Discharge:   Medication List       This list is accurate as of: 03/27/14  1:50 PM.  Always use your most recent med list.               diclofenac 75 MG EC tablet  Commonly known as:  VOLTAREN  Take 1 tablet (75 mg total) by mouth 2 (two) times daily.     ibuprofen 800 MG tablet  Commonly known as:  ADVIL,MOTRIN  Take 800 mg by mouth every 8 (eight) hours as needed for pain.     propranolol 10 MG tablet  Commonly known as:  INDERAL  1 - 2 tablets 30 minutes before social situation     sertraline 100 MG tablet  Commonly known as:  ZOLOFT  Take 2 tablets (200 mg total) by mouth daily.

## 2014-03-27 NOTE — Progress Notes (Signed)
Pre visit review using our clinic review tool, if applicable. No additional management support is needed unless otherwise documented below in the visit note. 

## 2014-03-27 NOTE — Patient Instructions (Signed)
REFERRALS TO SPECIALISTS, SPECIAL TESTS (MRI, CT, ULTRASOUNDS)  GO THE WAITING ROOM AND TELL CHECK IN YOU NEED HELP WITH A REFERRAL. Either MARION or LINDA will help you set it up.  If it is between 1-2 PM they may be at lunch.  After 5 PM, they will likely be at home.  They will call you, so please make sure the office has your correct phone number.  Referrals sometimes can be done same day if urgent, but others can take 2 or 3 days to get an appointment. Starting in 2015, many of the new Medicare insurance plans and Affordable Care Act (Obamacare) Health plans offered take much longer for referrals. They have added additional paperwork and steps.  MRI's and CT's can take up to a week for the test. (Emergencies like strokes take precedence. I will tell you if you have an emergency.)   If your referral is to an in-network [facility] office, their office may contact you directly prior to our office reaching you.  -- Examples: [facility], [facility], [facility], [facility], [facility], and many more.  Specialist appointment times vary a great deal, mostly on the specialist's schedule and if they have openings. -- Our office tries to get you in as fast as possible. -- Some specialists have very long wait times. (Example. Dermatology. Usually months) -- If you have a true emergency like new cancer, we work to get you in ASAP.   

## 2014-03-28 ENCOUNTER — Ambulatory Visit: Admitting: Family Medicine

## 2014-05-22 ENCOUNTER — Ambulatory Visit (INDEPENDENT_AMBULATORY_CARE_PROVIDER_SITE_OTHER): Admitting: Family Medicine

## 2014-05-22 ENCOUNTER — Encounter: Payer: Self-pay | Admitting: Family Medicine

## 2014-05-22 VITALS — BP 100/62 | HR 80 | Temp 98.7°F | Ht 63.0 in | Wt 179.5 lb

## 2014-05-22 DIAGNOSIS — M67912 Unspecified disorder of synovium and tendon, left shoulder: Secondary | ICD-10-CM

## 2014-05-22 DIAGNOSIS — M19019 Primary osteoarthritis, unspecified shoulder: Secondary | ICD-10-CM

## 2014-05-22 DIAGNOSIS — M6790 Unspecified disorder of synovium and tendon, unspecified site: Secondary | ICD-10-CM

## 2014-05-22 DIAGNOSIS — M542 Cervicalgia: Secondary | ICD-10-CM

## 2014-05-22 DIAGNOSIS — K589 Irritable bowel syndrome without diarrhea: Secondary | ICD-10-CM

## 2014-05-22 DIAGNOSIS — M259 Joint disorder, unspecified: Secondary | ICD-10-CM

## 2014-05-22 DIAGNOSIS — M719 Bursopathy, unspecified: Secondary | ICD-10-CM

## 2014-05-22 NOTE — Progress Notes (Signed)
Pre visit review using our clinic review tool, if applicable. No additional management support is needed unless otherwise documented below in the visit note. 

## 2014-05-22 NOTE — Progress Notes (Signed)
[place] [address] Phone: [phone] Fax: [phone]  Patient ID: Amy Marks MRN: [id number], DOB: 04/06/1971, 43 y.o. Date of Encounter: 05/22/2014  Primary Physician:  Owens Loffler, MD   Chief Complaint: Follow-up   Subjective:   History of Present Illness:  Amy Marks is a 43 y.o. very pleasant female patient who presents with the following:  Much better, impingement improved s/p subac inj, PT, HEP, good compliance.   IBS intermittent flares with diarrhea.   03/27/2014 Last OV with Owens Loffler, MD  Left shoulder pain:  Constant ache is just sitting. Abd is painful. Work does not hurt. Every now and again, shooting twinge - down up and down arm and back. She describes having some pain with abduction and occasionally with reaching around and internally rotating her shoulder on the left. She has a dull ache in her deltoid region, as well as posterior to this. She also has some pain in the t-shirt distribution. 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  She also has some mild posterior neck pain and pain in the shoulder blade region on the left. She also very rarely has been in since the last less than 10 seconds her she is very short, severe pain that is almost like a lightning strike.  inj l  Past Medical History, Surgical History, Social History, Family History, Problem List, Medications, and Allergies have been reviewed and updated if relevant.  Review of Systems:  GEN: No fevers, chills. Nontoxic. Primarily MSK c/o today. MSK: Detailed in the HPI GI: tolerating PO intake without difficulty Neuro: as above Otherwise the pertinent positives of the ROS are noted above.   Objective:   Physical Examination: BP 100/62  Pulse 80  Temp(Src) 98.7 F (37.1 C) (Oral)  Ht 5\' 3"  (1.6 m)  Wt 179 lb 8  oz (81.421 kg)  BMI 31.81 kg/m2   GEN: Well-developed,well-nourished,in no acute distress; alert,appropriate and cooperative throughout examination HEENT: Normocephalic and atraumatic without obvious abnormalities. Ears, externally no deformities PULM: Breathing comfortably in no respiratory distress EXT: No clubbing, cyanosis, or edema PSYCH: Normally interactive. Cooperative during the interview. Pleasant. Friendly and conversant. Not anxious or depressed appearing. Normal, full affect.  CERVICAL SPINE EXAM Range of motion: Flexion, extension, lateral bending, and rotation: full Pain with terminal motion: no Spinous Processes: NT SCM: NT Upper paracervical muscles: nt Upper traps: NT C5-T1 intact, sensation and motor   Left shoulder: no tender at the a.c. joint. Nontender on the clavicle. non tender in the bicipital groove. Strength testing is 5/5 throughout. Range of motion is full.  Speeds and Yergason's test are both neg.Jobe neg Neer testing is neg. Hawkins test is neg  Radiology: No results found.  Assessment & Plan:   Tendinopathy of left rotator cuff  AC joint arthropathy  Cervicalgia  IBS (irritable bowel syndrome)  Doing really well, cont rtc / scap stab 1-2 x a week  Reviewed ibs care  Follow-up: No Follow-up on file. Unless noted above, the patient is to follow-up if symptoms worsen. Red flags were reviewed with the patient.  Signed,  Maud Deed. Brennah Quraishi, MD, CAQ Sports Medicine   Discontinued Medications   No medications on file   Current Medications at Discharge:   Medication List       This list is accurate as of:  05/22/14  8:42 AM.  Always use your most recent med list.               diclofenac 75 MG EC tablet  Commonly known as:  VOLTAREN  Take 1 tablet (75 mg total) by mouth 2 (two) times daily.     ibuprofen 800 MG tablet  Commonly known as:  ADVIL,MOTRIN  Take 800 mg by mouth every 8 (eight) hours as needed for pain.      propranolol 10 MG tablet  Commonly known as:  INDERAL  1 - 2 tablets 30 minutes before social situation     sertraline 100 MG tablet  Commonly known as:  ZOLOFT  Take 2 tablets (200 mg total) by mouth daily.

## 2014-05-22 NOTE — Patient Instructions (Signed)
GETTING TO GOOD BOWEL HEALTH. Irregular bowel habits such as constipation and diarrhea can lead to many problems over time.  Having one soft bowel movement a day is the most important way to prevent further problems.  The anorectal canal is designed to handle stretching and feces to safely manage our ability to get rid of solid waste (feces, poop, stool) out of our body.  BUT, hard constipated stools can act like ripping concrete bricks and diarrhea can be a burning fire to this very sensitive area of our body, causing inflamed hemorrhoids, anal fissures, increasing risk is perirectal abscesses, abdominal pain/bloating, an making irritable bowel worse.     The goal: ONE SOFT BOWEL MOVEMENT A DAY!  To have soft, regular bowel movements:    Drink at least 8 tall glasses of water a day.     Take plenty of fiber.  Fiber is the undigested part of plant food that passes into the colon, acting s "natures broom" to encourage bowel motility and movement.  Fiber can absorb and hold large amounts of water. This results in a larger, bulkier stool, which is soft and easier to pass. Work gradually over several weeks up to 6 servings a day of fiber (25g a day even more if needed) in the form of: o Vegetables -- Root (potatoes, carrots, turnips), leafy green (lettuce, salad greens, celery, spinach), or cooked high residue (cabbage, broccoli, etc) o Fruit -- Fresh (unpeeled skin & pulp), Dried (prunes, apricots, cherries, etc ),  or stewed ( applesauce)  o Whole grain breads, pasta, etc (whole wheat)  o Bran cereals    Bulking Agents -- This type of water-retaining fiber generally is easily obtained each day by one of the following:  o Psyllium bran -- The psyllium plant is remarkable because its ground seeds can retain so much water. This product is available as Metamucil, Konsyl, Effersyllium, Per Diem Fiber, or the less expensive generic preparation in drug and health food stores. Although labeled a laxative, it really  is not a laxative.  o Methylcellulose -- This is another fiber derived from wood which also retains water. It is available as Citrucel. o Polyethylene Glycol - and "artificial" fiber commonly called Miralax or Glycolax.  It is helpful for people with gassy or bloated feelings with regular fiber o Flax Seed - a less gassy fiber than psyllium   No reading or other relaxing activity while on the toilet. If bowel movements take longer than 5 minutes, you are too constipated   AVOID CONSTIPATION.  High fiber and water intake usually takes care of this.  Sometimes a laxative is needed to stimulate more frequent bowel movements, but    Laxatives are not a good long-term solution as it can wear the colon out. o Osmotics (Milk of Magnesia, Fleets phosphosoda, Magnesium citrate, MiraLax, GoLytely) are safer than  o Stimulants (Senokot, Castor Oil, Dulcolax, Ex Lax)    o Do not take laxatives for more than 7days in a row.    IF SEVERELY CONSTIPATED, try a Bowel Retraining Program: o Do not use laxatives.  o Eat a diet high in roughage, such as bran cereals and leafy vegetables.  o Drink six (6) ounces of prune or apricot juice each morning.  o Eat two (2) large servings of stewed fruit each day.  o Take one (1) heaping tablespoon of a psyllium-based bulking agent twice a day. Use sugar-free sweetener when possible to avoid excessive calories.  o Eat a normal breakfast.  o   sit on the toilet, but do not strain to have a bowel movement.  o If you do not have a bowel movement by the third day, use an enema and repeat the above steps.  . Controlling diarrhea o Switch to liquids and simpler foods for a few days to avoid stressing your intestines further. o Avoid dairy products (especially milk & ice cream) for a short time.  The intestines often can lose the ability to digest lactose when stressed. o Avoid foods that cause gassiness or bloating.  Typical foods include  beans and other legumes, cabbage, broccoli, and dairy foods.  Every person has some sensitivity to other foods, so listen to our body and avoid those foods that trigger problems for you. o Adding fiber (Citrucel, Metamucil, psyllium, Miralax) gradually can help thicken stools by absorbing excess fluid and retrain the intestines to act more normally.  Slowly increase the dose over a few weeks.  Too much fiber too soon can backfire and cause cramping & bloating. o Probiotics (such as active yogurt, Align, etc) may help repopulate the intestines and colon with normal bacteria and calm down a sensitive digestive tract.  Most studies show it to be of mild help, though, and such products can be costly. o Medicines: - Bismuth subsalicylate (ex. Kayopectate, Pepto Bismol) every 30 minutes for up to 6 doses can help control diarrhea.  Avoid if pregnant. - Loperamide (Immodium) can slow down diarrhea.  Start with two tablets (4mg total) first and then try one tablet every 6 hours.  Avoid if you are having fevers or severe pain.  If you are not better or start feeling worse, stop all medicines and call your doctor for advice -   

## 2014-06-16 ENCOUNTER — Other Ambulatory Visit: Payer: Self-pay | Admitting: Family Medicine

## 2014-08-21 ENCOUNTER — Encounter: Payer: Self-pay | Admitting: Family Medicine

## 2014-08-27 ENCOUNTER — Other Ambulatory Visit: Payer: Self-pay | Admitting: Family Medicine

## 2014-11-08 ENCOUNTER — Telehealth: Payer: Self-pay | Admitting: Family Medicine

## 2014-11-08 NOTE — Telephone Encounter (Signed)
Please scheduled CPE with fasting labs prior with Dr. Lorelei Pont.  Will need this appointment in order to continue getting refills on her medications.

## 2014-11-08 NOTE — Telephone Encounter (Signed)
Last office visit 05/22/2014.  Last office visit that addressed Depression was 09/12/2013.  Last refilled states needs to schedule CPE with Dr. Lorelei Pont but no future appointments scheduled.  Ok to refill?

## 2014-11-08 NOTE — Telephone Encounter (Signed)
Left message asking pt to call office please schedule appointment

## 2014-11-08 NOTE — Telephone Encounter (Signed)
Ok to refill 180, 0 ref  Ask Treonna to set up a full physical in the next few months, labs before

## 2014-11-20 NOTE — Telephone Encounter (Signed)
Will address prevention/ CPX nothing else at appt. Make sure pt aware other issues will be addressed with Dr. Loletha Grayer PCP at future date.

## 2014-11-20 NOTE — Telephone Encounter (Signed)
Left message asking pt to call office  °

## 2014-11-20 NOTE — Telephone Encounter (Signed)
Okay with me, if okay with PCP. 

## 2014-11-20 NOTE — Telephone Encounter (Signed)
This is fine. I would like to keep her as my patient. I see their whole family, and I have been taking care of them since I first started at [facility]. Very nice patient.

## 2014-11-20 NOTE — Telephone Encounter (Signed)
Also cc Ramond Craver

## 2014-11-20 NOTE — Telephone Encounter (Signed)
Pt called back to schedule her cpx.  She stated she is working full time now and the only day she could come was Friday.  Is it ok to schedule cpx with dr Diona Browner

## 2014-11-21 NOTE — Telephone Encounter (Signed)
Pt aware prevention/cpe only with Dr. Diona Browner. Any other issues she will need to see [place]. Pt scheduled 2/19; Labs 2/12

## 2014-11-28 ENCOUNTER — Other Ambulatory Visit: Payer: Self-pay | Admitting: Family Medicine

## 2014-11-28 DIAGNOSIS — Z13220 Encounter for screening for lipoid disorders: Secondary | ICD-10-CM

## 2014-11-28 DIAGNOSIS — R5383 Other fatigue: Secondary | ICD-10-CM

## 2014-12-01 ENCOUNTER — Other Ambulatory Visit (INDEPENDENT_AMBULATORY_CARE_PROVIDER_SITE_OTHER)

## 2014-12-01 DIAGNOSIS — Z13220 Encounter for screening for lipoid disorders: Secondary | ICD-10-CM

## 2014-12-01 DIAGNOSIS — R5383 Other fatigue: Secondary | ICD-10-CM

## 2014-12-01 LAB — LIPID PANEL
Cholesterol: 168 mg/dL (ref 0–200)
HDL: 55.8 mg/dL (ref >39.00)
LDL Cholesterol: 102 mg/dL — ABNORMAL HIGH (ref 0–99)
NonHDL: 112.2
Total CHOL/HDL Ratio: 3
Triglycerides: 53 mg/dL (ref 0.0–149.0)
VLDL: 10.6 mg/dL (ref 0.0–40.0)

## 2014-12-01 LAB — CBC WITH DIFFERENTIAL/PLATELET
Basophils Absolute: 0 10*3/uL (ref 0.0–0.1)
Basophils Relative: 0.6 % (ref 0.0–3.0)
Eosinophils Absolute: 0.1 10*3/uL (ref 0.0–0.7)
Eosinophils Relative: 2.4 % (ref 0.0–5.0)
HCT: 40.3 % (ref 36.0–46.0)
Hemoglobin: 13.8 g/dL (ref 12.0–15.0)
Lymphocytes Relative: 23 % (ref 12.0–46.0)
Lymphs Abs: 1.3 10*3/uL (ref 0.7–4.0)
MCHC: 34.1 g/dL (ref 30.0–36.0)
MCV: 89.3 fl (ref 78.0–100.0)
Monocytes Absolute: 0.3 10*3/uL (ref 0.1–1.0)
Monocytes Relative: 6.1 % (ref 3.0–12.0)
Neutro Abs: 3.8 10*3/uL (ref 1.4–7.7)
Neutrophils Relative %: 67.9 % (ref 43.0–77.0)
Platelets: 188 10*3/uL (ref 150.0–400.0)
RBC: 4.51 Mil/uL (ref 3.87–5.11)
RDW: 12.5 % (ref 11.5–15.5)
WBC: 5.6 10*3/uL (ref 4.0–10.5)

## 2014-12-01 LAB — HEPATIC FUNCTION PANEL
ALT: 11 U/L (ref 0–35)
AST: 14 U/L (ref 0–37)
Albumin: 4.1 g/dL (ref 3.5–5.2)
Alkaline Phosphatase: 62 U/L (ref 39–117)
Bilirubin, Direct: 0 mg/dL (ref 0.0–0.3)
Total Bilirubin: 0.4 mg/dL (ref 0.2–1.2)
Total Protein: 7.2 g/dL (ref 6.0–8.3)

## 2014-12-01 LAB — BASIC METABOLIC PANEL
BUN: 17 mg/dL (ref 6–23)
CO2: 28 mEq/L (ref 19–32)
Calcium: 9.1 mg/dL (ref 8.4–10.5)
Chloride: 107 mEq/L (ref 96–112)
Creatinine, Ser: 0.92 mg/dL (ref 0.40–1.20)
GFR: 71.09 mL/min (ref >60.00)
Glucose, Bld: 91 mg/dL (ref 70–99)
Potassium: 4.7 mEq/L (ref 3.5–5.1)
Sodium: 139 mEq/L (ref 135–145)

## 2014-12-01 LAB — TSH: TSH: 2.16 u[IU]/mL (ref 0.35–4.50)

## 2014-12-08 ENCOUNTER — Encounter: Payer: Self-pay | Admitting: Family Medicine

## 2014-12-08 ENCOUNTER — Ambulatory Visit (INDEPENDENT_AMBULATORY_CARE_PROVIDER_SITE_OTHER): Admitting: Family Medicine

## 2014-12-08 VITALS — BP 94/64 | HR 75 | Temp 98.6°F | Ht 63.5 in | Wt 193.5 lb

## 2014-12-08 DIAGNOSIS — E669 Obesity, unspecified: Secondary | ICD-10-CM

## 2014-12-08 DIAGNOSIS — Z23 Encounter for immunization: Secondary | ICD-10-CM

## 2014-12-08 DIAGNOSIS — Z0000 Encounter for general adult medical examination without abnormal findings: Secondary | ICD-10-CM

## 2014-12-08 NOTE — Patient Instructions (Addendum)
Starting a regualr exercise 150 min per week.  Work on healthy eating and weight loss.

## 2014-12-08 NOTE — Assessment & Plan Note (Signed)
Encouraged exercise, weight loss, healthy eating habits. ? ?

## 2014-12-08 NOTE — Progress Notes (Signed)
Subjective:    Patient ID: Amy Marks, female    DOB: [birth year], 44 y.o.   MRN: [id number]  HPI   44 year old female pt  of Dr. Lorelei Pont presents for annual exam.   She is doing well overall.  Her depression and anxiety are managed by Dr. Lorelei Pont. Well controlled on zoloft.  She does have some stress.  She is seeing a Social worker.  She has gained some weight. Working on [employer]. Exercise:  None  At goal < 130. Lab Results  Component Value Date   CHOL 168 12/01/2014   HDL 55.80 12/01/2014   LDLCALC 102* 12/01/2014   TRIG 53.0 12/01/2014   CHOLHDL 3 12/01/2014   TSH nml , no anemia.  Review of Systems  Constitutional: Negative for fever, fatigue and unexpected weight change.  HENT: Negative for congestion, ear pain, sinus pressure, sneezing, sore throat and trouble swallowing.   Eyes: Negative for pain and itching.  Respiratory: Negative for cough, shortness of breath and wheezing.   Cardiovascular: Negative for chest pain, palpitations and leg swelling.  Gastrointestinal: Positive for diarrhea. Negative for nausea, abdominal pain, constipation and blood in stool.       IBS causes diarrhea at times.  Genitourinary: Negative for dysuria, hematuria, vaginal discharge, difficulty urinating and menstrual problem.  Skin: Negative for rash.  Neurological: Negative for syncope, weakness, light-headedness, numbness and headaches.  Psychiatric/Behavioral: Negative for confusion and dysphoric mood. The patient is not nervous/anxious.        Objective:   Physical Exam  Constitutional: Vital signs are normal. She appears well-developed and well-nourished. She is cooperative.  Non-toxic appearance. She does not appear ill. No distress.  Obese   HENT:  Head: Normocephalic.  Right Ear: Hearing, tympanic membrane, external ear and ear canal normal.  Left Ear: Hearing, tympanic membrane, external ear and ear canal normal.  Nose: Nose normal.  Eyes: Conjunctivae, EOM and lids  are normal. Pupils are equal, round, and reactive to light. Lids are everted and swept, no foreign bodies found.  Neck: Trachea normal and normal range of motion. Neck supple. Carotid bruit is not present. No thyroid mass and no thyromegaly present.  Cardiovascular: Normal rate, regular rhythm, S1 normal, S2 normal, normal heart sounds and intact distal pulses.  Exam reveals no gallop.   No murmur heard. Pulmonary/Chest: Effort normal and breath sounds normal. No respiratory distress. She has no wheezes. She has no rhonchi. She has no rales.  Abdominal: Soft. Normal appearance and bowel sounds are normal. She exhibits no distension, no fluid wave, no abdominal bruit and no mass. There is no hepatosplenomegaly. There is no tenderness. There is no rebound, no guarding and no CVA tenderness. No hernia.  Lymphadenopathy:    She has no cervical adenopathy.    She has no axillary adenopathy.  Neurological: She is alert. She has normal strength. No cranial nerve deficit or sensory deficit.  Skin: Skin is warm, dry and intact. No rash noted.  Psychiatric: Her speech is normal and behavior is normal. Judgment normal. Her mood appears not anxious. Cognition and memory are normal. She does not exhibit a depressed mood.          Assessment & Plan:  The patient's preventative maintenance and recommended screening tests for an annual wellness exam were reviewed in full today. Brought up to date unless services declined.  Counselled on the importance of diet, exercise, and its role in overall health and mortality. The patient's FH and SH was  reviewed, including their home life, tobacco status, and drug and alcohol status.   Vaccines: Flu given today,  uptodate with Td Mammo: Has done at GYN, Dr. Philis Pique  DVE/PAP: Per GYN Dr. Philis Pique Has quit smoking.   about 20 pack year history HIV screen:  Refused. Colon: no early first degree family history of colon cancer.

## 2014-12-08 NOTE — Progress Notes (Signed)
Pre visit review using our clinic review tool, if applicable. No additional management support is needed unless otherwise documented below in the visit note. 

## 2015-01-18 ENCOUNTER — Other Ambulatory Visit: Payer: Self-pay | Admitting: Family Medicine

## 2015-07-15 ENCOUNTER — Other Ambulatory Visit: Payer: Self-pay | Admitting: Family Medicine

## 2015-07-15 NOTE — Telephone Encounter (Signed)
Last office visit 12/08/2014 with Dr. Diona Browner for CPE.  Last office visit that addressed Depression 09/12/2013.  Refill?

## 2015-07-16 NOTE — Telephone Encounter (Signed)
Always addressed with CPE, intermittently other times i have seen her

## 2016-02-04 DIAGNOSIS — F4320 Adjustment disorder, unspecified: Secondary | ICD-10-CM | POA: Diagnosis not present

## 2016-02-29 DIAGNOSIS — F4320 Adjustment disorder, unspecified: Secondary | ICD-10-CM | POA: Diagnosis not present

## 2016-04-11 DIAGNOSIS — F4320 Adjustment disorder, unspecified: Secondary | ICD-10-CM | POA: Diagnosis not present

## 2016-06-27 ENCOUNTER — Encounter: Payer: Self-pay | Admitting: Obstetrics and Gynecology

## 2016-06-27 ENCOUNTER — Ambulatory Visit (INDEPENDENT_AMBULATORY_CARE_PROVIDER_SITE_OTHER): Payer: BLUE CROSS/BLUE SHIELD | Admitting: Obstetrics and Gynecology

## 2016-06-27 VITALS — BP 112/70 | HR 60 | Resp 14 | Ht 63.0 in | Wt 190.8 lb

## 2016-06-27 DIAGNOSIS — Z01419 Encounter for gynecological examination (general) (routine) without abnormal findings: Secondary | ICD-10-CM | POA: Diagnosis not present

## 2016-06-27 DIAGNOSIS — Z1151 Encounter for screening for human papillomavirus (HPV): Secondary | ICD-10-CM | POA: Diagnosis not present

## 2016-06-27 DIAGNOSIS — N951 Menopausal and female climacteric states: Secondary | ICD-10-CM | POA: Diagnosis not present

## 2016-06-27 DIAGNOSIS — Z23 Encounter for immunization: Secondary | ICD-10-CM

## 2016-06-27 LAB — COMPREHENSIVE METABOLIC PANEL
ALT: 18 U/L (ref 6–29)
AST: 18 U/L (ref 10–30)
Albumin: 4.5 g/dL (ref 3.6–5.1)
Alkaline Phosphatase: 60 U/L (ref 33–115)
BUN: 13 mg/dL (ref 7–25)
CO2: 27 mmol/L (ref 20–31)
Calcium: 9.7 mg/dL (ref 8.6–10.2)
Chloride: 103 mmol/L (ref 98–110)
Creat: 0.85 mg/dL (ref 0.50–1.10)
Glucose, Bld: 90 mg/dL (ref 65–99)
Potassium: 5 mmol/L (ref 3.5–5.3)
Sodium: 138 mmol/L (ref 135–146)
Total Bilirubin: 0.5 mg/dL (ref 0.2–1.2)
Total Protein: 7 g/dL (ref 6.1–8.1)

## 2016-06-27 LAB — CBC
HCT: 43.1 % (ref 35.0–45.0)
Hemoglobin: 14.4 g/dL (ref 11.7–15.5)
MCH: 31.2 pg (ref 27.0–33.0)
MCHC: 33.4 g/dL (ref 32.0–36.0)
MCV: 93.5 fL (ref 80.0–100.0)
MPV: 10.9 fL (ref 7.5–12.5)
Platelets: 183 10*3/uL (ref 140–400)
RBC: 4.61 MIL/uL (ref 3.80–5.10)
RDW: 12.2 % (ref 11.0–15.0)
WBC: 6.3 10*3/uL (ref 3.8–10.8)

## 2016-06-27 LAB — LIPID PANEL
Cholesterol: 185 mg/dL (ref 125–200)
HDL: 53 mg/dL (ref >=46)
LDL Cholesterol: 119 mg/dL (ref <130)
Total CHOL/HDL Ratio: 3.5 Ratio (ref <=5.0)
Triglycerides: 63 mg/dL (ref <150)
VLDL: 13 mg/dL (ref <30)

## 2016-06-27 NOTE — Progress Notes (Signed)
45 y.o. G35P2002 Divorced Caucasian female here for annual exam.   Had right sided pain with last menses.  This was the first cycle in a long time.  Was on Micronor through prior GYN to treat pelvic pain and some sweating.  Having a lot of hot flashes. Gets very cold sometimes as well not related to the heat.   Divorcing.  Stressful. Good support.  Seeing a psychologist. Dr. Marden Noble.   Not SA in 3 years.  Declines STD testing.   FH of breast cancer.  Mother was negative for genetic mutations.  Sister was tested and was negative.   PCP:  Donella Stade, MD.  Patient's last menstrual period was 06/13/2016 (exact date).     Period Pattern: (!) Irregular     Sexually active: No  Female  The current method of family planning is tubal ligation/Abstinence.    Exercising: No.   Smoker:  Yes, smokes 1/2 ppd  Health Maintenance: Pap:  08-26-11 Neg(pt. Thought had pap 2015) History of abnormal Pap:  Yes, hx HR HPV in past 5 years--but no colposcopy, no treatment MMG:  2016 with Dr.Horvath normal([facility] by [facility]) Colonoscopy:  02-14-13 normal with Dr. Neoma Laming due 01/2023. BMD:   n/a  Result  n/a TDaP:  UNSURE.   Gardasil:   N/A HIV:  Declines. Hep C:  Declines. Screening Labs:  Hb today: 14.4, Urine today: Neg   reports that she has been smoking Cigarettes.  She has been smoking about 0.50 packs per day. She has never used smokeless tobacco. She reports that she drinks alcohol. She reports that she does not use drugs.  Past Medical History:  Diagnosis Date  . Anxiety   . Depression   . OCD (obsessive compulsive disorder)   . Seasonal allergies   . Social anxiety disorder 09/12/2013  . STD (sexually transmitted disease)    Hx Pos. HR HPV    Past Surgical History:  Procedure Laterality Date  . DILITATION & CURRETTAGE/HYSTROSCOPY WITH NOVASURE ABLATION  09/03/2012   Procedure: DILATATION & CURETTAGE/HYSTEROSCOPY WITH NOVASURE ABLATION;  Surgeon: Daria Pastures, MD;  Location: [facility];  Service: Gynecology;  Laterality: N/A;  . LAPAROSCOPIC TUBAL LIGATION  09/03/2012   Procedure: LAPAROSCOPIC TUBAL LIGATION;  Surgeon: Daria Pastures, MD;  Location: [facility];  Service: Gynecology;  Laterality: Bilateral;  FILSHIE CLIPS  . LAPAROSCOPY  10/14/2012   Procedure: LAPAROSCOPY OPERATIVE with LSO and LOA;  Surgeon: Farrel Gobble. Harrington Challenger, MD;  Location: [facility];  Service: Gynecology;  Laterality: N/A;  . LAPAROTOMY  10/14/2012   Procedure: EXPLORATORY LAPAROTOMY;  Surgeon: Farrel Gobble. Harrington Challenger, MD;  Location: [facility];  Service: Gynecology;  Laterality: N/A;  . NASAL SEPTUM SURGERY    . OOPHORECTOMY  10/14/2012   Left ovary removed--hx "ruptured chocolate cyst"  . TONSILLECTOMY    . TUBAL LIGATION      Current Outpatient Prescriptions  Medication Sig Dispense Refill  . ibuprofen (ADVIL,MOTRIN) 800 MG tablet Take 800 mg by mouth every 8 (eight) hours as needed for pain.    Marland Kitchen propranolol (INDERAL) 10 MG tablet 1 - 2 tablets 30 minutes before social situation 60 tablet 5  . sertraline (ZOLOFT) 100 MG tablet TAKE 2 TABLETS DAILY 180 tablet 3   No current facility-administered medications for this visit.     Family History  Problem Relation Age of Onset  . Depression Mother   . Fibromyalgia Mother   . Breast cancer Mother 83  . Rheum arthritis Mother   .  Alcohol abuse Father   . Emphysema Maternal Grandmother     Dec COPD  . Breast cancer Maternal Grandmother 56  . Multiple sclerosis Maternal Grandfather   . Breast cancer Paternal Aunt 19    A & W    ROS:  Pertinent items are noted in HPI.  Otherwise, a comprehensive ROS was negative.  Exam:   BP 112/70 (BP Location: Right Arm, Patient Position: Sitting, Cuff Size: Large)   Pulse 60   Resp 14   Ht 5\' 3"  (1.6 m)   Wt 190 lb 12.8 oz (86.5 kg)   LMP 06/13/2016 (Exact Date)   BMI 33.80 kg/m     General appearance: alert, cooperative and appears stated age Head: Normocephalic, without obvious  abnormality, atraumatic Neck: no adenopathy, supple, symmetrical, trachea midline and thyroid normal to inspection and palpation Lungs: clear to auscultation bilaterally Breasts: normal appearance, no masses or tenderness, No nipple retraction or dimpling, No nipple discharge or bleeding, No axillary or supraclavicular adenopathy Heart: regular rate and rhythm Abdomen: soft, non-tender; no masses, no organomegaly Extremities: extremities normal, atraumatic, no cyanosis or edema Skin: Skin color, texture, turgor normal. No rashes or lesions Lymph nodes: Cervical, supraclavicular, and axillary nodes normal. No abnormal inguinal nodes palpated Neurologic: Grossly normal  Pelvic: External genitalia:  no lesions              Urethra:  normal appearing urethra with no masses, tenderness or lesions              Bartholins and Skenes: normal                 Vagina: normal appearing vagina with normal color and discharge, no lesions              Cervix: no lesions              Pap taken: Yes.   Bimanual Exam:  Uterus:  normal size, contour, position, consistency, mobility, non-tender              Adnexa: no mass, fullness, tenderness              Rectal exam: Yes.  .  Confirms.              Anus:  normal sphincter tone, no lesions  Chaperone was present for exam.  Assessment:   Well woman visit with normal exam. FH of breast cancer.  Genetic testing negative for mother and sister. Remote hx of HPV.  Status post BTL and endometrial ablation.  Status post laparoscopic LSO and LOA for ruptured endometrioma. Smoker.  Anxiety.   Plan: Yearly mammogram recommended after age 11.  Recommended self breast exam.  Pap and HR HPV as above. Discussed Calcium, Vitamin D.  Discussed exercise for weight loss and stress relief.  Routine labs, TSH, FSH, estradiol. Declines referral for genetic testing.  TDap today.  Herbal products for menopausal symptoms.  Declines HRT.  Referral to [facility] Smoking  Cessation Program.   She will follow up with her PCP regarding her anxiety.   Follow up annually and prn.      After visit summary provided.

## 2016-06-27 NOTE — Patient Instructions (Signed)
EXERCISE AND DIET:  We recommended that you start or continue a regular exercise program for good health. Regular exercise means any activity that makes your heart beat faster and makes you sweat.  We recommend exercising at least 30 minutes per day at least 3 days a week, preferably 4 or 5.  We also recommend a diet low in fat and sugar.  Inactivity, poor dietary choices and obesity can cause diabetes, heart attack, stroke, and kidney damage, among others.    ALCOHOL AND SMOKING:  Women should limit their alcohol intake to no more than 7 drinks/beers/glasses of wine (combined, not each!) per week. Moderation of alcohol intake to this level decreases your risk of breast cancer and liver damage. And of course, no recreational drugs are part of a healthy lifestyle.  And absolutely no smoking or even second hand smoke. Most people know smoking can cause heart and lung diseases, but did you know it also contributes to weakening of your bones? Aging of your skin?  Yellowing of your teeth and nails?  CALCIUM AND VITAMIN D:  Adequate intake of calcium and Vitamin D are recommended.  The recommendations for exact amounts of these supplements seem to change often, but generally speaking 600 mg of calcium (either carbonate or citrate) and 800 units of Vitamin D per day seems prudent. Certain women may benefit from higher intake of Vitamin D.  If you are among these women, your doctor will have told you during your visit.    PAP SMEARS:  Pap smears, to check for cervical cancer or precancers,  have traditionally been done yearly, although recent scientific advances have shown that most women can have pap smears less often.  However, every woman still should have a physical exam from her gynecologist every year. It will include a breast check, inspection of the vulva and vagina to check for abnormal growths or skin changes, a visual exam of the cervix, and then an exam to evaluate the size and shape of the uterus and  ovaries.  And after 44 years of age, a rectal exam is indicated to check for rectal cancers. We will also provide age appropriate advice regarding health maintenance, like when you should have certain vaccines, screening for sexually transmitted diseases, bone density testing, colonoscopy, mammograms, etc.   MAMMOGRAMS:  All women over 40 years old should have a yearly mammogram. Many facilities now offer a "3D" mammogram, which may cost around $50 extra out of pocket. If possible,  we recommend you accept the option to have the 3D mammogram performed.  It both reduces the number of women who will be called back for extra views which then turn out to be normal, and it is better than the routine mammogram at detecting truly abnormal areas.    COLONOSCOPY:  Colonoscopy to screen for colon cancer is recommended for all women at age 50.  We know, you hate the idea of the prep.  We agree, BUT, having colon cancer and not knowing it is worse!!  Colon cancer so often starts as a polyp that can be seen and removed at colonscopy, which can quite literally save your life!  And if your first colonoscopy is normal and you have no family history of colon cancer, most women don't have to have it again for 10 years.  Once every ten years, you can do something that may end up saving your life, right?  We will be happy to help you get it scheduled when you are ready.    Be sure to check your insurance coverage so you understand how much it will cost.  It may be covered as a preventative service at no cost, but you should check your particular policy.    Menopause and Herbal Products WHAT IS MENOPAUSE? Menopause is the normal time of life when menstrual periods decrease in frequency and eventually stop completely. This process can take several years for some women. Menopause is complete when you have had an absence of menstruation for a full year since your last menstrual period. It usually occurs between the ages of 38 and 46.  It is not common for menopause to begin before the age of 47. During menopause, your body stops producing the female hormones estrogen and progesterone. Common symptoms associated with this loss of hormones (vasomotor symptoms) are:  Hot flashes.  Hot flushes.  Night sweats. Other common symptoms and complications of menopause include:  Decrease in sex drive.  Vaginal dryness and thinning of the walls of the vagina. This can make sex painful.  Dryness of the skin and development of wrinkles.  Headaches.  Tiredness.  Irritability.  Memory problems.  Weight gain.  Bladder infections.  Hair growth on the face and chest.  Inability to reproduce offspring (infertility).  Loss of density in the bones (osteoporosis) increasing your risk for breaks (fractures).  Depression.  Hardening and narrowing of the arteries (atherosclerosis). This increases your risk of heart attack and stroke. WHAT TREATMENT OPTIONS ARE AVAILABLE? There are many treatment choices for menopause symptoms. The most common treatment is hormone replacement therapy. Many alternative therapies for menopause are emerging, including the use of herbal products. These supplements can be found in the form of herbs, teas, oils, tinctures, and pills. Common herbal supplements for menopause are made from plants that contain phytoestrogens. Phytoestrogens are compounds that occur naturally in plants and plant products. They act like estrogen in the body. Foods and herbs that contain phytoestrogens include:  Soy.  Flax seeds.  Red clover.  Ginseng. WHAT MENOPAUSE SYMPTOMS MAY BE HELPED IF I USE HERBAL PRODUCTS?  Vasomotor symptoms. These may be helped by:  Soy. Some studies show that soy may have a moderate benefit for hot flashes.  Black cohosh. There is limited evidence indicating this may be beneficial for hot flashes.  Symptoms that are related to heart and blood vessel disease. These may be helped by soy.  Studies have shown that soy can help to lower cholesterol.  Depression. This may be helped by:  St. John's wort. There is limited evidence that shows this may help mild to moderate depression.  Black cohosh. There is evidence that this may help depression and mood swings.  Osteoporosis. Soy may help to decrease bone loss that is associated with menopause and may prevent osteoporosis. Limited evidence indicates that red clover may offer some bone loss protection as well. Other herbal products that are commonly used during menopause lack enough evidence to support their use as a replacement for conventional menopause therapies. These products include evening primrose, ginseng, and red clover. WHAT ARE THE CASES WHEN HERBAL PRODUCTS SHOULD NOT BE USED DURING MENOPAUSE? Do not use herbal products during menopause without your health care provider's approval if:  You are taking medicine.  You have a preexisting liver condition. ARE THERE ANY RISKS IN MY TAKING HERBAL PRODUCTS DURING MENOPAUSE? If you choose to use herbal products to help with symptoms of menopause, keep in mind that:  Different supplements have different and unmeasured amounts of herbal ingredients.  Herbal products are not regulated the same way that medicines are.  Concentrations of herbs may vary depending on the way they are prepared. For example, the concentration may be different in a pill, tea, oil, and tincture.  Little is known about the risks of using herbal products, particularly the risks of long-term use.  Some herbal supplements can be harmful when combined with certain medicines. Most commonly reported side effects of herbal products are mild. However, if used improperly, many herbal supplements can cause serious problems. Talk to your health care provider before starting any herbal product. If problems develop, stop taking the supplement and let your health care provider know.   This information is not  intended to replace advice given to you by your health care provider. Make sure you discuss any questions you have with your health care provider.   Document Released: 03/24/2008 Document Revised: 10/27/2014 Document Reviewed: 03/21/2014 Elsevier Interactive Patient Education [employer].

## 2016-06-28 LAB — VITAMIN D 25 HYDROXY (VIT D DEFICIENCY, FRACTURES): Vit D, 25-Hydroxy: 33 ng/mL (ref 30–100)

## 2016-06-28 LAB — ESTRADIOL: Estradiol: 121 pg/mL

## 2016-06-28 LAB — FOLLICLE STIMULATING HORMONE: FSH: 10.8 m[IU]/mL

## 2016-06-28 LAB — TSH: TSH: 1.72 mIU/L

## 2016-07-01 ENCOUNTER — Other Ambulatory Visit: Payer: Self-pay | Admitting: Obstetrics and Gynecology

## 2016-07-01 DIAGNOSIS — Z1231 Encounter for screening mammogram for malignant neoplasm of breast: Secondary | ICD-10-CM

## 2016-07-01 LAB — IPS PAP TEST WITH HPV

## 2016-07-11 ENCOUNTER — Ambulatory Visit

## 2016-08-08 ENCOUNTER — Ambulatory Visit
Admission: RE | Admit: 2016-08-08 | Discharge: 2016-08-08 | Disposition: A | Discharge status: Home / Self Care | Payer: BLUE CROSS/BLUE SHIELD | Source: Ambulatory Visit | Attending: Obstetrics and Gynecology | Admitting: Obstetrics and Gynecology

## 2016-08-08 DIAGNOSIS — Z1231 Encounter for screening mammogram for malignant neoplasm of breast: Secondary | ICD-10-CM

## 2016-08-08 IMAGING — MG 2D DIGITAL SCREENING BILATERAL MAMMOGRAM WITH CAD AND ADJUNCT TO
9 of 13 series · 9 of 29 positions shown · non-contrast
Comparison: Previous exam(s).

CLINICAL DATA: Screening.

EXAM:
2D DIGITAL SCREENING BILATERAL MAMMOGRAM WITH CAD AND ADJUNCT TOMO

[R MLO (1 of 2)]
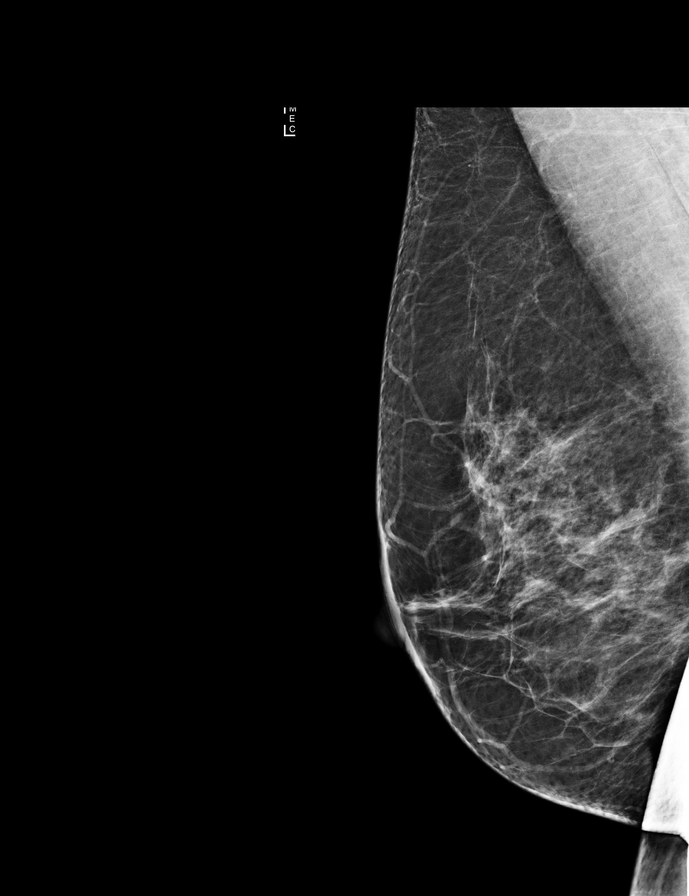

[L MLO synth-2D]
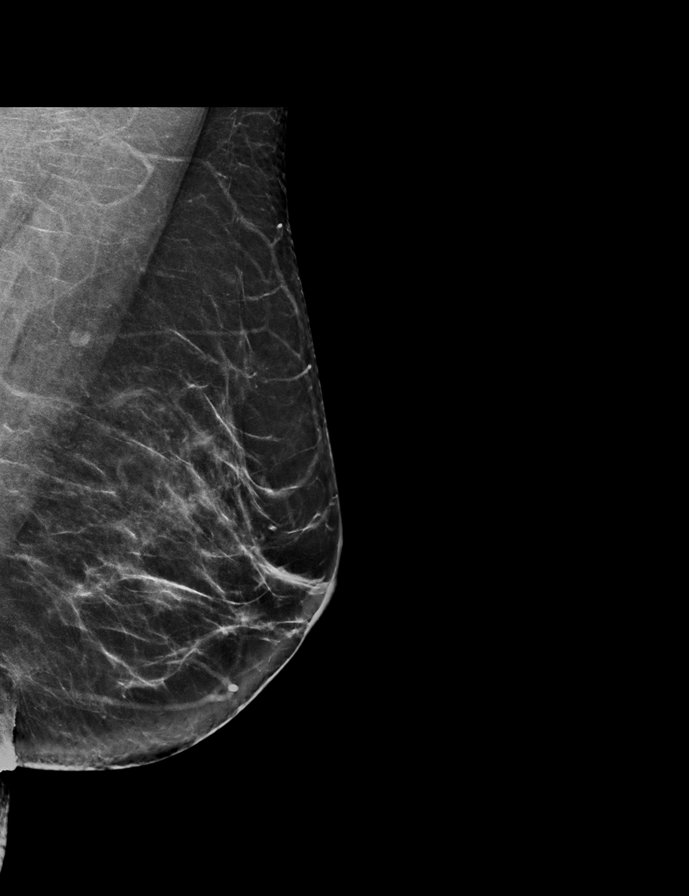

[R MLO (2 of 2)]
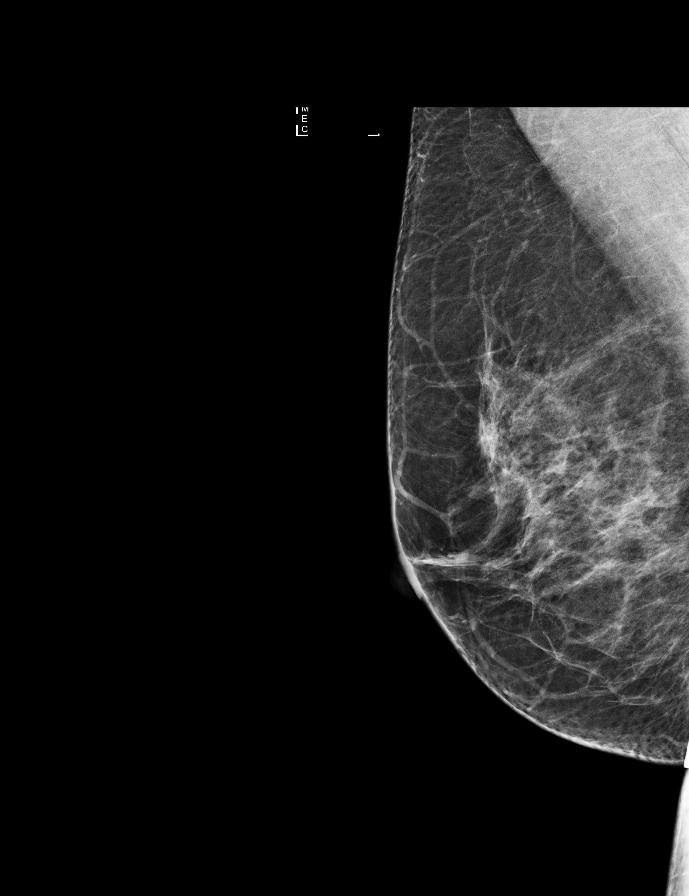

[R CC]
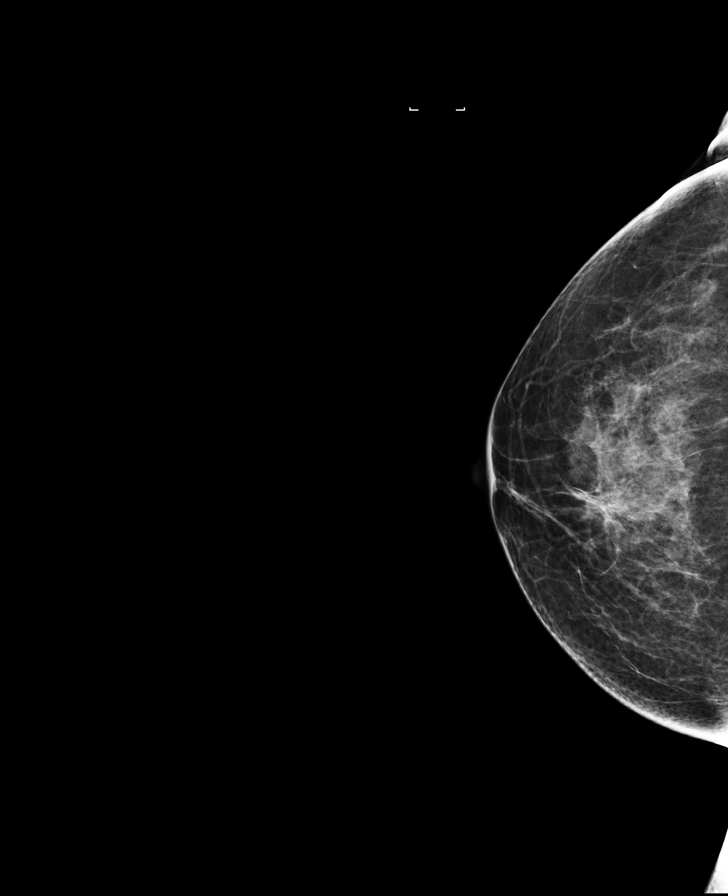

[L MLO]
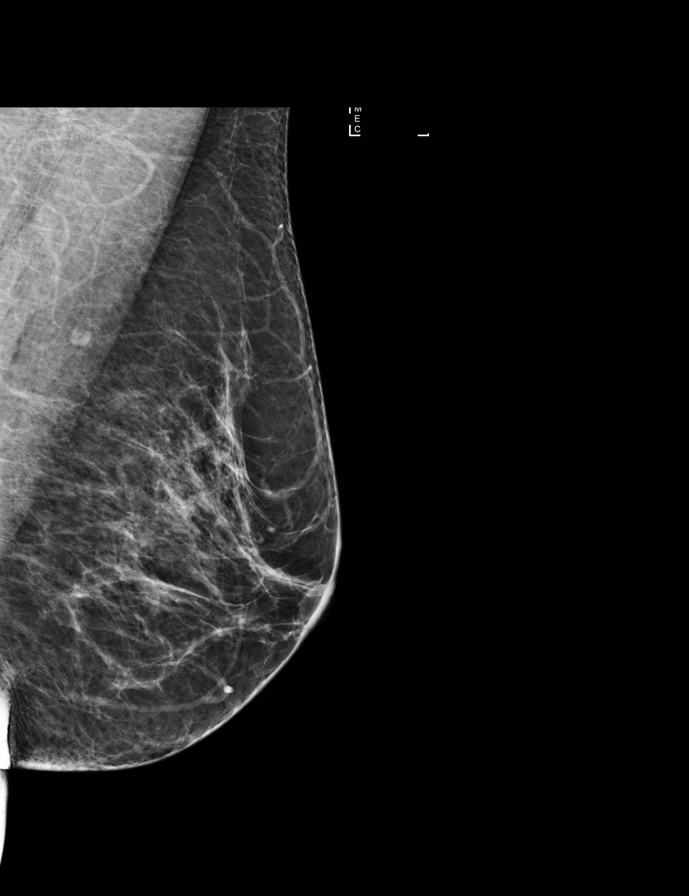

[L CC]
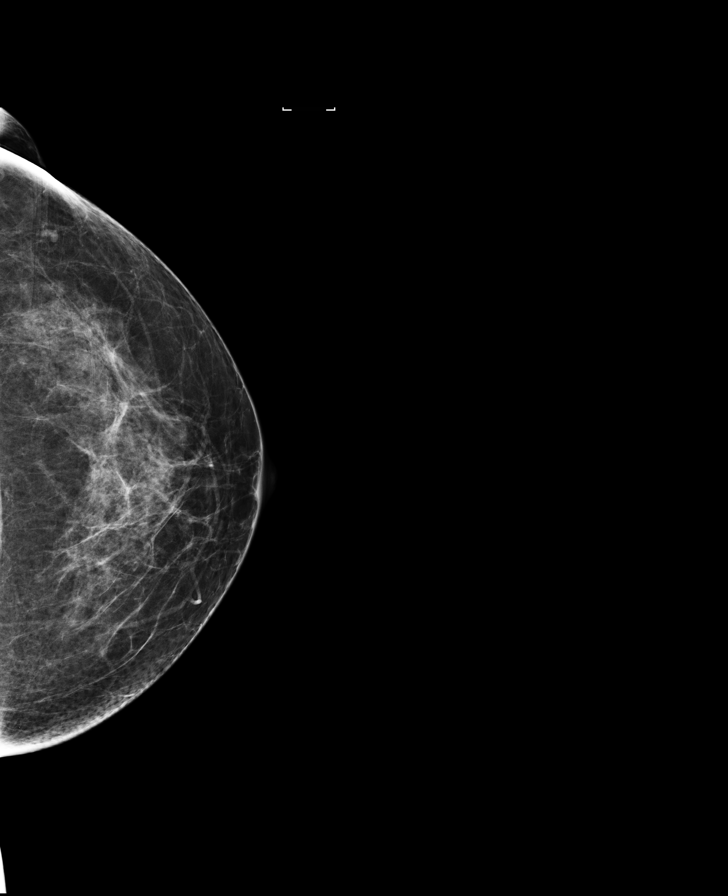

[L CC synth-2D]
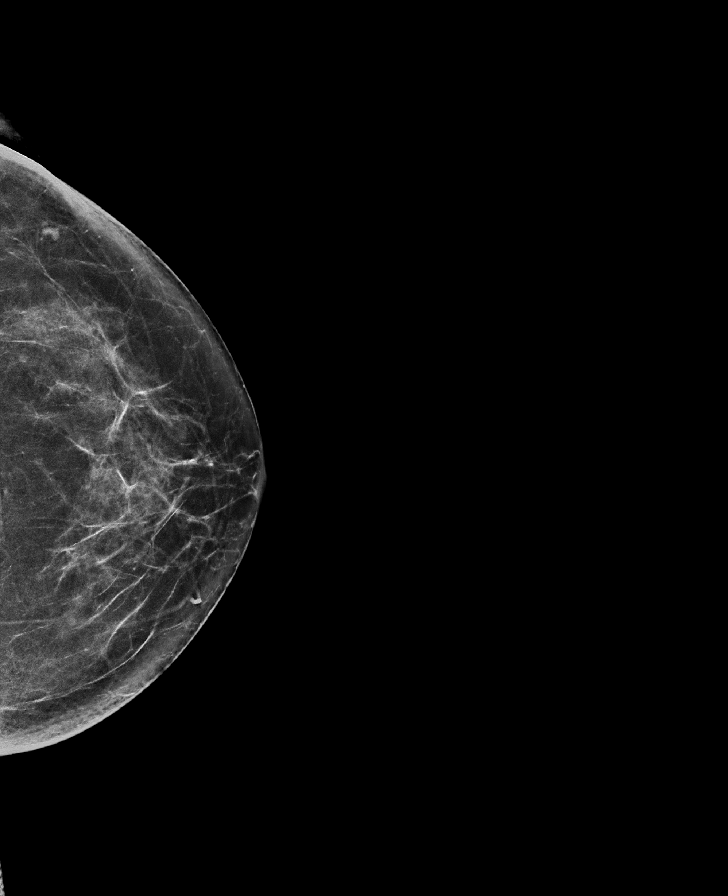

[R CC synth-2D]
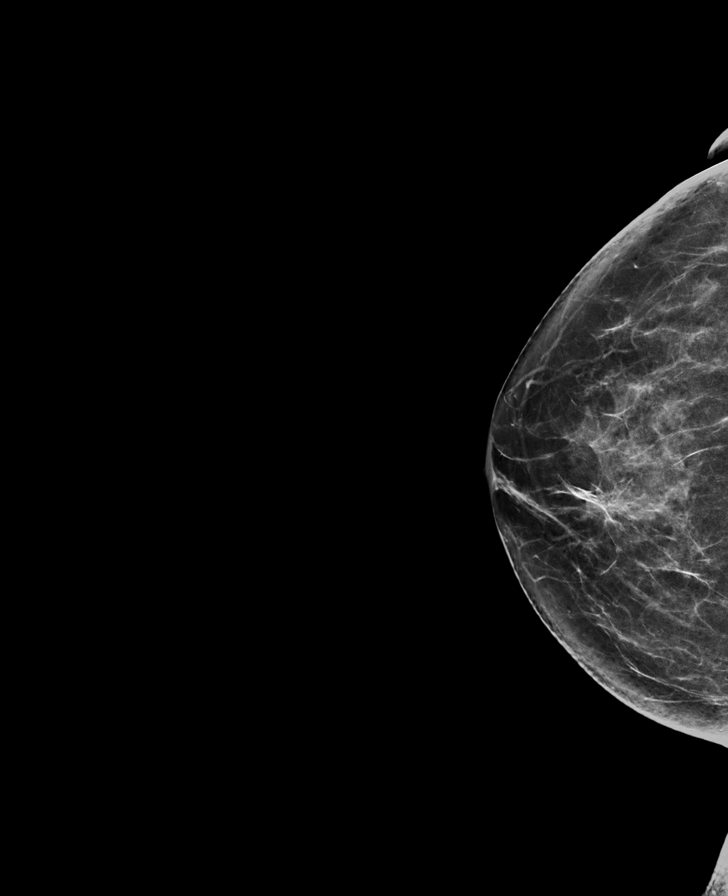

[R MLO synth-2D]
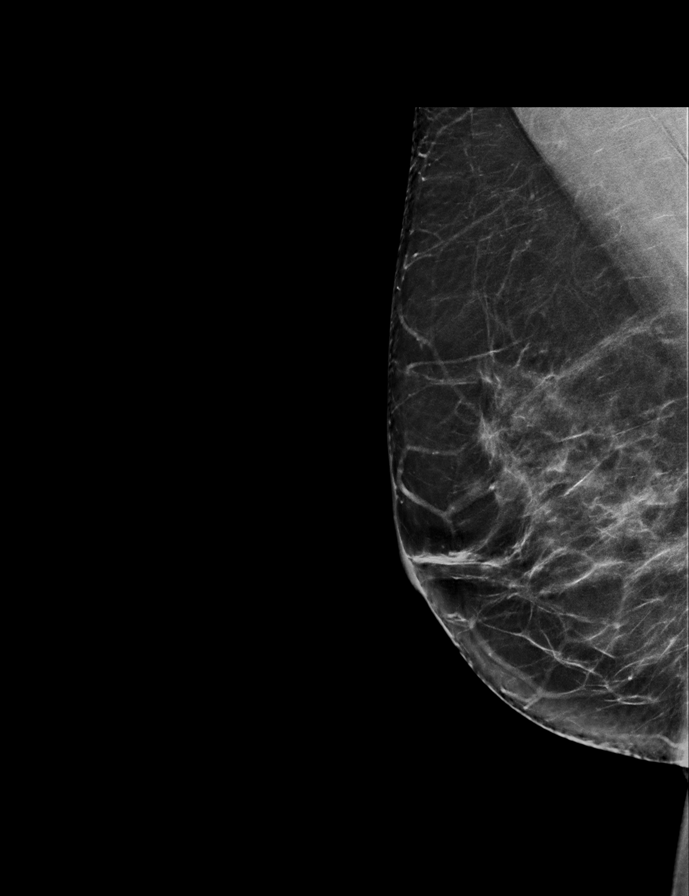

[9 of 29 positions shown; findings below may reference images not displayed]

ACR Breast Density Category b: There are scattered areas of
fibroglandular density.
FINDINGS: There are no findings suspicious for malignancy. Images were
processed with CAD.
IMPRESSION: No mammographic evidence of malignancy. A result letter of this
screening mammogram will be mailed directly to the patient.

RECOMMENDATION:
Screening mammogram in one year. (Code:[33])

BI-RADS CATEGORY  1: Negative.

## 2016-08-29 ENCOUNTER — Ambulatory Visit: Payer: BLUE CROSS/BLUE SHIELD

## 2016-09-26 DIAGNOSIS — F4320 Adjustment disorder, unspecified: Secondary | ICD-10-CM | POA: Diagnosis not present

## 2016-10-15 ENCOUNTER — Encounter: Payer: Self-pay | Admitting: Family Medicine

## 2016-10-15 ENCOUNTER — Ambulatory Visit (INDEPENDENT_AMBULATORY_CARE_PROVIDER_SITE_OTHER): Payer: BLUE CROSS/BLUE SHIELD | Admitting: Family Medicine

## 2016-10-15 VITALS — BP 112/60 | HR 68 | Wt 195.0 lb

## 2016-10-15 DIAGNOSIS — F410 Panic disorder [episodic paroxysmal anxiety] without agoraphobia: Secondary | ICD-10-CM | POA: Diagnosis not present

## 2016-10-15 MED ORDER — PROPRANOLOL HCL 10 MG PO TABS: ORAL_TABLET | ORAL | 5 refills | Status: AC

## 2016-10-15 NOTE — Patient Instructions (Signed)
Panic Attacks Panic attacks are sudden, short-livedsurges of severe anxiety, fear, or discomfort. They may occur for no reason when you are relaxed, when you are anxious, or when you are sleeping. Panic attacks may occur for a number of reasons:  Healthy people occasionally have panic attacks in extreme, life-threatening situations, such as war or natural disasters. Normal anxiety is a protective mechanism of the body that helps us react to danger (fight or flight response).  Panic attacks are often seen with anxiety disorders, such as panic disorder, social anxiety disorder, generalized anxiety disorder, and phobias. Anxiety disorders cause excessive or uncontrollable anxiety. They may interfere with your relationships or other life activities.  Panic attacks are sometimes seen with other mental illnesses, such as depression and posttraumatic stress disorder.  Certain medical conditions, prescription medicines, and drugs of abuse can cause panic attacks. What are the signs or symptoms? Panic attacks start suddenly, peak within 20 minutes, and are accompanied by four or more of the following symptoms:  Pounding heart or fast heart rate (palpitations).  Sweating.  Trembling or shaking.  Shortness of breath or feeling smothered.  Feeling choked.  Chest pain or discomfort.  Nausea or strange feeling in your stomach.  Dizziness, light-headedness, or feeling like you will faint.  Chills or hot flushes.  Numbness or tingling in your lips or hands and feet.  Feeling that things are not real or feeling that you are not yourself.  Fear of losing control or going crazy.  Fear of dying. Some of these symptoms can mimic serious medical conditions. For example, you may think you are having a heart attack. Although panic attacks can be very scary, they are not life threatening. How is this diagnosed? Panic attacks are diagnosed through an assessment by your health care provider. Your  health care provider will ask questions about your symptoms, such as where and when they occurred. Your health care provider will also ask about your medical history and use of alcohol and drugs, including prescription medicines. Your health care provider may order blood tests or other studies to rule out a serious medical condition. Your health care provider may refer you to a mental health professional for further evaluation. How is this treated?  Most healthy people who have one or two panic attacks in an extreme, life-threatening situation will not require treatment.  The treatment for panic attacks associated with anxiety disorders or other mental illness typically involves counseling with a mental health professional, medicine, or a combination of both. Your health care provider will help determine what treatment is best for you.  Panic attacks due to physical illness usually go away with treatment of the illness. If prescription medicine is causing panic attacks, talk with your health care provider about stopping the medicine, decreasing the dose, or substituting another medicine.  Panic attacks due to alcohol or drug abuse go away with abstinence. Some adults need professional help in order to stop drinking or using drugs. Follow these instructions at home:  Take all medicines as directed by your health care provider.  Schedule and attend follow-up visits as directed by your health care provider. It is important to keep all your appointments. Contact a health care provider if:  You are not able to take your medicines as prescribed.  Your symptoms do not improve or get worse. Get help right away if:  You experience panic attack symptoms that are different than your usual symptoms.  You have serious thoughts about hurting yourself or others.    You are taking medicine for panic attacks and have a serious side effect. This information is not intended to replace advice given to you by  your health care provider. Make sure you discuss any questions you have with your health care provider. Document Released: 10/06/2005 Document Revised: 03/13/2016 Document Reviewed: 05/20/2013 Elsevier Interactive Patient Education  2017 Elsevier Inc.  

## 2016-10-15 NOTE — Progress Notes (Addendum)
Subjective:    Patient ID: Amy Marks, female    DOB: [birth year], 45 y.o.   MRN: [id number]  HPI This is a 45 yo female who presents today with episodes she describes as "panic attacks." She has chest pains, "feels pushed down," these were determined to be panic attacks by her psychiatrist. She has had episodes of anxiety in the past and was given inderal. She did not think to try for these episodes. She is concerned about being on something that might sedate her as she is very sensitive to medicine. She is currently on sertraline 100 mg. Has been on 200 mg in the past but this made her feel foggy. Panic attacks usually occur in the morning. Has had 5 episodes in the last couple of months. Feels tight over left side of chest, pain comes and goes in rapid succession. No radiation of pain. Lasts 5-10 minutes. No nausea, no diaphoresis. Relieved with deep breathing and talking to someone.   Is currently undergoing a custody battle and has a court date at the end of January. Sees therapist monthly and family therapist monthly with children and ex-husband. Works in a [employer], job is somewhat stressful. Has good support system with family members.   Sees gyn annually, had visit this year. Up to date on mammo and pap.   Past Medical History:  Diagnosis Date  . Anxiety   . Depression   . OCD (obsessive compulsive disorder)   . Seasonal allergies   . Social anxiety disorder 09/12/2013  . STD (sexually transmitted disease)    Hx Pos. HR HPV   Past Surgical History:  Procedure Laterality Date  . DILITATION & CURRETTAGE/HYSTROSCOPY WITH NOVASURE ABLATION  09/03/2012   Procedure: DILATATION & CURETTAGE/HYSTEROSCOPY WITH NOVASURE ABLATION;  Surgeon: Daria Pastures, MD;  Location: [facility];  Service: Gynecology;  Laterality: N/A;  . LAPAROSCOPIC TUBAL LIGATION  09/03/2012   Procedure: LAPAROSCOPIC TUBAL LIGATION;  Surgeon: Daria Pastures, MD;  Location: [facility];  Service: Gynecology;   Laterality: Bilateral;  FILSHIE CLIPS  . LAPAROSCOPY  10/14/2012   Procedure: LAPAROSCOPY OPERATIVE with LSO and LOA;  Surgeon: Farrel Gobble. Harrington Challenger, MD;  Location: [facility];  Service: Gynecology;  Laterality: N/A;  . LAPAROTOMY  10/14/2012   Procedure: EXPLORATORY LAPAROTOMY;  Surgeon: Farrel Gobble. Harrington Challenger, MD;  Location: [facility];  Service: Gynecology;  Laterality: N/A;  . NASAL SEPTUM SURGERY    . OOPHORECTOMY  10/14/2012   Left ovary removed--hx "ruptured chocolate cyst"  . TONSILLECTOMY    . TUBAL LIGATION     Family History  Problem Relation Age of Onset  . Depression Mother   . Fibromyalgia Mother   . Breast cancer Mother 3  . Rheum arthritis Mother   . Alcohol abuse Father   . Emphysema Maternal Grandmother     Dec COPD  . Breast cancer Maternal Grandmother 36  . Multiple sclerosis Maternal Grandfather   . Breast cancer Paternal Aunt 25    A & W   Social History  Substance Use Topics  . Smoking status: Current Every Day Smoker    Packs/day: 0.50    Types: Cigarettes  . Smokeless tobacco: Never Used     Comment: started smoking again 2015  . Alcohol use Yes     Comment: rarely--4-5 drinks/year      Review of Systems Per HPI    Objective:   Physical Exam Physical Exam  Vitals reviewed. Constitutional: Oriented to person, place, and time. Appears  well-developed and well-nourished.  HENT:  Head: Normocephalic and atraumatic.  Eyes: Conjunctivae are normal.  Neck: Normal range of motion. Neck supple.  Cardiovascular: Normal rate.   Pulmonary/Chest: Effort normal.  Musculoskeletal: Normal range of motion.  Neurological: Alert and oriented to person, place, and time.  Skin: Skin is warm and dry.  Psychiatric: Normal mood and affect. Behavior is normal. Judgment and thought content normal. Occasionally tearful during visit.      BP 112/60   Pulse 68   Wt 195 lb (88.5 kg)   LMP 10/01/2016   SpO2 99%   BMI 34.54 kg/m  Wt Readings from Last 3 Encounters:  10/15/16  195 lb (88.5 kg)  06/27/16 190 lb 12.8 oz (86.5 kg)  12/08/14 193 lb 8 oz (87.8 kg)       Assessment & Plan:  1. Panic attacks - discussed treatment options and patient very concerned about any medication that might be sedating.  - Will try inderal as she has used this in the past for episodes of anxiety. Discussed treating as soon as symptoms begin - Provided written and verbal information regarding diagnosis and treatment. - encouraged self care- meditation/yoga, regular exercise - propranolol (INDERAL) 10 MG tablet; 1 - 2 tablets 30 minutes before social situation  Dispense: 60 tablet; Refill: 5 - she will follow up if worsening symptoms or if no improvement with inderal, will consider very low dose benzo.  - Over 25 minutes were spent face-to-face with the patient during this encounter and >50% of that time was spent on counseling and coordination of care  Clarene Reamer, FNP-BC  [facility] [place]  10/15/2016 8:32 AM

## 2016-10-20 DIAGNOSIS — A048 Other specified bacterial intestinal infections: Secondary | ICD-10-CM

## 2016-10-20 HISTORY — DX: Other specified bacterial intestinal infections: A04.8

## 2016-11-18 ENCOUNTER — Encounter: Payer: Self-pay | Admitting: Family Medicine

## 2016-11-18 NOTE — Telephone Encounter (Signed)
Last office visit 10/15/2016 with Glenda Chroman for Panic Attacks.  Last CPE 12/08/2014 with Dr. Diona Browner.  Last seen by her PCP Dr. Lorelei Pont 05/22/2014.  Refill?

## 2016-11-19 MED ORDER — SERTRALINE HCL 100 MG PO TABS: 100.0000 mg | ORAL_TABLET | Freq: Every day | ORAL | 0 refills | Status: DC

## 2016-11-19 NOTE — Telephone Encounter (Signed)
Ok to refill 3 month supply, 0 ref  F/u with me this spring CPX

## 2016-11-19 NOTE — Telephone Encounter (Signed)
Letter mailed to patient asking her to call the office and schedule a CPE with Dr. Lorelei Pont in the Spring.

## 2016-12-01 ENCOUNTER — Telehealth: Payer: Self-pay

## 2016-12-01 MED ORDER — OSELTAMIVIR PHOSPHATE 75 MG PO CAPS: 75.0000 mg | ORAL_CAPSULE | Freq: Every day | ORAL | 0 refills | Status: DC

## 2016-12-01 NOTE — Telephone Encounter (Signed)
Sent in tamiflu

## 2016-12-01 NOTE — Telephone Encounter (Signed)
Jamayah notified Tamiflu has been sent into [facility].

## 2016-12-01 NOTE — Telephone Encounter (Signed)
PLEASE NOTE: All timestamps contained within this report are represented as [place] Standard Time. CONFIDENTIALTY NOTICE: This fax transmission is intended only for the addressee. It contains information that is legally privileged, confidential or otherwise protected from use or disclosure. If you are not the intended recipient, you are strictly prohibited from reviewing, disclosing, copying using or disseminating any of this information or taking any action in reliance on or regarding this information. If you have received this fax in error, please notify us immediately by telephone so that we can arrange for its return to [place]. Phone: [phone], Toll-Free: [phone], Fax: [phone] Page: 1 of 1 Call Id: [id number] [place] Patient Name: Amy Marks Gender: Female DOB: 09/16/1971 Age: 46 Y 2 M 6 D Return Phone Number: [phone] (Primary) City/State/Zip:  Client [place] Night - Client Client Site [place] Physician Copland, Frederico Hamman - MD Who Is Calling Patient / Member / Family / Caregiver Call Type Triage / Clinical Relationship To Patient Self Return Phone Number [phone] (Primary) Chief Complaint Infection Exposure Reason for Call Symptomatic / Request for Health Information Initial Comment CCM - Spoke with Paige, she still would like Rx called in. RECORD 1 of 4: Caller states she was exposed to flu from her dtr, the MD told her to call and get a preventative. Denies new/worsening s/s. Nurse Assessment Nurse: Thad Ranger, RN, Langley Gauss Date/Time (Eastern Time): 11/30/2016 9:51:01 AM Please select the assessment type ---Verbal order / New medication order Additional Documentation ---RECORD 1 of 4: Caller states she was exposed to flu from her dtr, the MD told her to call and get a preventative. Denies new/ worsening  s/s. Does the client directives allow for assistance with medications after hours? ---No Additional Documentation ---Advised to call the MDO in the am during reg bus hrs for the Rx. Verb understanding. Guidelines Guideline Title Affirmed Question Disp. Time Eilene Ghazi Time) Disposition Final User 11/30/2016 9:52:04 AM Clinical Call Yes Thad Ranger, RN, Langley Gauss

## 2016-12-05 DIAGNOSIS — F329 Major depressive disorder, single episode, unspecified: Secondary | ICD-10-CM | POA: Diagnosis not present

## 2016-12-08 ENCOUNTER — Encounter: Payer: Self-pay | Admitting: Family Medicine

## 2016-12-08 ENCOUNTER — Ambulatory Visit (INDEPENDENT_AMBULATORY_CARE_PROVIDER_SITE_OTHER): Payer: BLUE CROSS/BLUE SHIELD | Admitting: Family Medicine

## 2016-12-08 VITALS — BP 100/60 | HR 70 | Temp 98.7°F | Ht 63.0 in | Wt 190.8 lb

## 2016-12-08 DIAGNOSIS — Z0000 Encounter for general adult medical examination without abnormal findings: Secondary | ICD-10-CM

## 2016-12-08 DIAGNOSIS — Z114 Encounter for screening for human immunodeficiency virus [HIV]: Secondary | ICD-10-CM

## 2016-12-08 MED ORDER — SERTRALINE HCL 100 MG PO TABS: 100.0000 mg | ORAL_TABLET | Freq: Every day | ORAL | 3 refills | Status: DC

## 2016-12-08 NOTE — Progress Notes (Signed)
Pre visit review using our clinic review tool, if applicable. No additional management support is needed unless otherwise documented below in the visit note. 

## 2016-12-08 NOTE — Progress Notes (Signed)
Dr. Frederico Hamman T. Ladasia Sircy, MD, [facility] [place], [address] Phone: [phone] Fax: [phone]  12/08/2016  Patient: Amy Marks, MRN: [id number], DOB: [birth year], 46 y.o.  Primary Physician:  Owens Loffler, MD   Chief Complaint  Patient presents with  . Annual Exam   Subjective:   Amy Marks is a 46 y.o. pleasant patient who presents with the following:  Health Maintenance Summary Reviewed and updated, unless pt declines services.  Tobacco History Reviewed. 1/2 pack a day right now Alcohol: No concerns, no excessive use Exercise Habits: Some activity, rec at least 30 mins 5 times a week STD concerns: none Drug Use: None Birth control method: BTL Menses regular: n/a Lumps or breast concerns: no Breast Cancer Family History: Mother  Ongoing stress with recent divorce and custody battle.  Patient Active Problem List   Diagnosis Date Noted  . Obesity (BMI 30.0-34.9) 12/08/2014  . Social anxiety disorder 09/12/2013  . Major depressive disorder, recurrent ([place]) 06/27/2013  . Carpal tunnel syndrome, bilateral 12/01/2011  . ANXIETY STATE, UNSPECIFIED 01/02/2011  . DISORDERS, OBSESSIVE-COMPULSIVE 03/26/2007    Past Medical History:  Diagnosis Date  . Anxiety   . Depression   . OCD (obsessive compulsive disorder)   . Seasonal allergies   . Social anxiety disorder 09/12/2013    Past Surgical History:  Procedure Laterality Date  . DILITATION & CURRETTAGE/HYSTROSCOPY WITH NOVASURE ABLATION  09/03/2012   Procedure: DILATATION & CURETTAGE/HYSTEROSCOPY WITH NOVASURE ABLATION;  Surgeon: Daria Pastures, MD;  Location: [facility];  Service: Gynecology;  Laterality: N/A;  . LAPAROSCOPIC TUBAL LIGATION  09/03/2012   Procedure: LAPAROSCOPIC TUBAL LIGATION;  Surgeon: Daria Pastures, MD;  Location: [facility];  Service: Gynecology;  Laterality: Bilateral;  FILSHIE CLIPS  . LAPAROSCOPY  10/14/2012   Procedure:  LAPAROSCOPY OPERATIVE with LSO and LOA;  Surgeon: Farrel Gobble. Harrington Challenger, MD;  Location: [facility];  Service: Gynecology;  Laterality: N/A;  . LAPAROTOMY  10/14/2012   Procedure: EXPLORATORY LAPAROTOMY;  Surgeon: Farrel Gobble. Harrington Challenger, MD;  Location: [facility];  Service: Gynecology;  Laterality: N/A;  . NASAL SEPTUM SURGERY    . OOPHORECTOMY  10/14/2012   Left ovary removed--hx "ruptured chocolate cyst"  . TONSILLECTOMY      Social History   Social History  . Marital status: Divorced    Spouse name: N/A  . Number of children: 2  . Years of education: N/A   Occupational History  . D[employer]AL [employer]   Social History Main Topics  . Smoking status: Current Every Day Smoker    Packs/day: 0.50    Types: Cigarettes  . Smokeless tobacco: Never Used     Comment: started smoking again 2015  . Alcohol use Yes     Comment: rarely--4-5 drinks/year  . Drug use: No  . Sexual activity: No   Other Topics Concern  . Not on file   Social History Narrative  . No narrative on file    Family History  Problem Relation Age of Onset  . Depression Mother   . Fibromyalgia Mother   . Breast cancer Mother 12  . Rheum arthritis Mother   . Alcohol abuse Father   . Emphysema Maternal Grandmother     Dec COPD  . Breast cancer Maternal Grandmother 65  . Multiple sclerosis Maternal Grandfather   . Breast cancer Paternal Aunt 22    A & W    No Known Allergies  Medication  list reviewed and updated in full in [facility].   Medication list has been reviewed and updated.   General: Denies fever, chills, sweats. No significant weight loss. Eyes: Denies blurring,significant itching ENT: Denies earache, sore throat, and hoarseness.  Cardiovascular: Denies chest pains, palpitations, dyspnea on exertion,  Respiratory: Denies cough, dyspnea at rest,wheeezing Breast: no concerns about lumps GI: Denies nausea, vomiting, diarrhea, constipation, change in bowel habits, abdominal pain, melena,  hematochezia GU: Denies dysuria, hematuria, urinary hesitancy, nocturia, denies STD risk, no concerns about discharge Musculoskeletal: Denies back pain, joint pain Derm: Denies rash, itching Neuro: Denies  paresthesias, frequent falls, frequent headaches Psych: Denies depression, anxiety Endocrine: Denies cold intolerance, heat intolerance, polydipsia Heme: Denies enlarged lymph nodes Allergy: No hayfever  Objective:   BP 100/60   Pulse 70   Temp 98.7 F (37.1 C) (Oral)   Ht _0  (1.6 m)   Wt 190 lb 12 oz (86.5 kg)   BMI 33.79 kg/m  No exam data present  GEN: well developed, well nourished, no acute distress Eyes: conjunctiva and lids normal, PERRLA, EOMI ENT: TM clear, nares clear, oral exam WNL Neck: supple, no lymphadenopathy, no thyromegaly, no JVD Pulm: clear to auscultation and percussion, respiratory effort normal CV: regular rate and rhythm, S1-S2, no murmur, rub or gallop, no bruits Chest: no scars, masses, no lumps BREAST: breast exam declined GI: soft, non-tender; no hepatosplenomegaly, masses; active bowel sounds all quadrants GU: GU exam declined Lymph: no cervical, axillary or inguinal adenopathy MSK: gait normal, muscle tone and strength WNL, no joint swelling, effusions, discoloration, crepitus  SKIN: clear, good turgor, color WNL, no rashes, lesions, or ulcerations Neuro: normal mental status, normal strength, sensation, and motion Psych: alert; oriented to person, place and time, normally interactive and not anxious or depressed in appearance.   All labs reviewed with patient. Lipids:    Component Value Date/Time   CHOL 185 06/27/2016 1352   TRIG 63 06/27/2016 1352   HDL 53 06/27/2016 1352   VLDL 13 06/27/2016 1352   CHOLHDL 3.5 06/27/2016 1352   CBC: CBC Latest Ref Rng & Units 06/27/2016 12/01/2014 01/21/2013  WBC 3.8 - 10.8 K/uL 6.3 5.6 4.3(L)  Hemoglobin 11.7 - 15.5 g/dL 14.4 13.8 12.9  Hematocrit 35.0 - 45.0 % 43.1 40.3 38.1  Platelets 140 - 400  K/uL 183 188.0 197.5    Basic Metabolic Panel:    Component Value Date/Time   NA 138 06/27/2016 1352   K 5.0 06/27/2016 1352   CL 103 06/27/2016 1352   CO2 27 06/27/2016 1352   BUN 13 06/27/2016 1352   CREATININE 0.85 06/27/2016 1352   GLUCOSE 90 06/27/2016 1352   CALCIUM 9.7 06/27/2016 1352   Hepatic Function Latest Ref Rng & Units 06/27/2016 12/01/2014 01/21/2013  Total Protein 6.1 - 8.1 g/dL 7.0 7.2 7.6  Albumin 3.6 - 5.1 g/dL 4.5 4.1 4.3  AST 10 - 30 U/L _1 ALT 6 - 29 U/L _2 Alk Phosphatase 33 - 115 U/L 60 62 53  Total Bilirubin 0.2 - 1.2 mg/dL 0.5 0.4 0.5  Bilirubin, Direct 0.0 - 0.3 mg/dL - 0.0 -    Lab Results  Component Value Date   TSH 1.72 06/27/2016   Results for orders placed or performed in visit on 12/08/16  HIV antibody  Result Value Ref Range   HIV 1&2 Ab, 4th Generation NONREACTIVE NONREACTIVE     Assessment and Plan:   Healthcare maintenance  Screening for HIV (  human immunodeficiency virus) - Plan: HIV antibody  Health Maintenance Exam: The patient's preventative maintenance and recommended screening tests for an annual wellness exam were reviewed in full today. Brought up to date unless services declined.  Counselled on the importance of diet, exercise, and its role in overall health and mortality. The patient's FH and SH was reviewed, including their home life, tobacco status, and drug and alcohol status.  Follow-up in 1 year for physical exam or additional follow-up below.  Follow-up: No Follow-up on file. Or follow-up in 1 year if not noted.  Meds ordered this encounter  Medications  . sertraline (ZOLOFT) 100 MG tablet    Sig: Take 1 tablet (100 mg total) by mouth daily.    Dispense:  90 tablet    Refill:  3   Medications Discontinued During This Encounter  Medication Reason  . oseltamivir (TAMIFLU) 75 MG capsule   . sertraline (ZOLOFT) 100 MG tablet Reorder   Orders Placed This Encounter  Procedures  . HIV antibody     Signed,  Frederico Hamman T. Lion Fernandez, MD   Allergies as of 12/08/2016   No Known Allergies     Medication List       Accurate as of 12/08/16 11:59 PM. Always use your most recent med list.          ibuprofen 800 MG tablet Commonly known as:  ADVIL,MOTRIN Take 800 mg by mouth every 8 (eight) hours as needed for pain.   propranolol 10 MG tablet Commonly known as:  INDERAL 1 - 2 tablets 30 minutes before social situation   sertraline 100 MG tablet Commonly known as:  ZOLOFT Take 1 tablet (100 mg total) by mouth daily.

## 2016-12-09 ENCOUNTER — Encounter: Payer: Self-pay | Admitting: Family Medicine

## 2016-12-09 LAB — HIV ANTIBODY (ROUTINE TESTING W REFLEX): HIV 1&2 Ab, 4th Generation: NONREACTIVE

## 2016-12-19 DIAGNOSIS — F329 Major depressive disorder, single episode, unspecified: Secondary | ICD-10-CM | POA: Diagnosis not present

## 2017-01-09 DIAGNOSIS — F329 Major depressive disorder, single episode, unspecified: Secondary | ICD-10-CM | POA: Diagnosis not present

## 2017-02-06 DIAGNOSIS — F329 Major depressive disorder, single episode, unspecified: Secondary | ICD-10-CM | POA: Diagnosis not present

## 2017-03-06 DIAGNOSIS — F329 Major depressive disorder, single episode, unspecified: Secondary | ICD-10-CM | POA: Diagnosis not present

## 2017-04-21 DIAGNOSIS — R0789 Other chest pain: Secondary | ICD-10-CM | POA: Diagnosis not present

## 2017-04-21 DIAGNOSIS — R1013 Epigastric pain: Secondary | ICD-10-CM | POA: Diagnosis not present

## 2017-04-23 DIAGNOSIS — R1013 Epigastric pain: Secondary | ICD-10-CM | POA: Diagnosis not present

## 2017-05-01 DIAGNOSIS — F329 Major depressive disorder, single episode, unspecified: Secondary | ICD-10-CM | POA: Diagnosis not present

## 2017-05-01 DIAGNOSIS — R1013 Epigastric pain: Secondary | ICD-10-CM | POA: Diagnosis not present

## 2017-05-22 DIAGNOSIS — F329 Major depressive disorder, single episode, unspecified: Secondary | ICD-10-CM | POA: Diagnosis not present

## 2017-06-12 DIAGNOSIS — F329 Major depressive disorder, single episode, unspecified: Secondary | ICD-10-CM | POA: Diagnosis not present

## 2017-06-19 ENCOUNTER — Encounter: Payer: Self-pay | Admitting: Family Medicine

## 2017-06-19 ENCOUNTER — Encounter: Payer: Self-pay | Admitting: *Deleted

## 2017-06-19 ENCOUNTER — Ambulatory Visit (INDEPENDENT_AMBULATORY_CARE_PROVIDER_SITE_OTHER): Payer: BLUE CROSS/BLUE SHIELD | Admitting: Family Medicine

## 2017-06-19 VITALS — BP 120/78 | HR 72 | Temp 98.9°F | Ht 63.0 in | Wt 180.5 lb

## 2017-06-19 DIAGNOSIS — L989 Disorder of the skin and subcutaneous tissue, unspecified: Secondary | ICD-10-CM | POA: Insufficient documentation

## 2017-06-19 DIAGNOSIS — R21 Rash and other nonspecific skin eruption: Secondary | ICD-10-CM

## 2017-06-19 MED ORDER — PREDNISONE 10 MG PO TABS: 20.0000 mg | ORAL_TABLET | Freq: Every day | ORAL | 0 refills | Status: DC

## 2017-06-19 NOTE — Patient Instructions (Signed)
Complete 6 days of prednisone. Call if rash not improving as expected.

## 2017-06-19 NOTE — Progress Notes (Signed)
   Subjective:    Patient ID: Amy Marks, female    DOB: 06/11/1971, 46 y.o.   MRN: [id number]  Rash  This is a new problem. The current episode started more than 1 month ago. The problem has been gradually worsening since onset. The affected locations include the scalp, head, back, torso, left upper leg and right upper leg. The rash is characterized by itchiness. She was exposed to a new medication ( may have noted correlation with Hpylori antibitoics). Pertinent negatives include no anorexia, congestion, cough, diarrhea, eye pain, facial edema, fatigue, fever, joint pain, rhinorrhea, shortness of breath, sore throat or vomiting. Past treatments include antibiotic cream and anti-itch cream (HAs changed to  hypoallergenic items). The treatment provided no relief. There is no history of allergies, asthma, eczema or varicella.     no lip swelling or tounge swelling. No SOB.  Review of Systems  Constitutional: Negative for fatigue and fever.  HENT: Negative for congestion, rhinorrhea and sore throat.   Eyes: Negative for pain.  Respiratory: Negative for cough and shortness of breath.   Gastrointestinal: Negative for anorexia, diarrhea and vomiting.  Musculoskeletal: Negative for joint pain.  Skin: Positive for rash.       Objective:   Physical Exam  Constitutional: Vital signs are normal. She appears well-developed and well-nourished. She is cooperative.  Non-toxic appearance. She does not appear ill. No distress.  HENT:  Head: Normocephalic.  Right Ear: Hearing, tympanic membrane, external ear and ear canal normal. Tympanic membrane is not erythematous, not retracted and not bulging.  Left Ear: Hearing, tympanic membrane, external ear and ear canal normal. Tympanic membrane is not erythematous, not retracted and not bulging.  Nose: No mucosal edema or rhinorrhea. Right sinus exhibits no maxillary sinus tenderness and no frontal sinus tenderness. Left sinus exhibits no maxillary sinus  tenderness and no frontal sinus tenderness.  Mouth/Throat: Uvula is midline, oropharynx is clear and moist and mucous membranes are normal.  Eyes: Pupils are equal, round, and reactive to light. Conjunctivae, EOM and lids are normal. Lids are everted and swept, no foreign bodies found.  Neck: Trachea normal and normal range of motion. Neck supple. Carotid bruit is not present. No thyroid mass and no thyromegaly present.  Cardiovascular: Normal rate, regular rhythm, S1 normal, S2 normal, normal heart sounds, intact distal pulses and normal pulses.  Exam reveals no gallop and no friction rub.   No murmur heard. Pulmonary/Chest: Effort normal and breath sounds normal. No tachypnea. No respiratory distress. She has no decreased breath sounds. She has no wheezes. She has no rhonchi. She has no rales.  Abdominal: Soft. Normal appearance and bowel sounds are normal. There is no tenderness.  Neurological: She is alert.  Skin: Skin is warm, dry and intact. No rash noted.  Excoriations and erythmatous papules on posterior scalp, middle of back in patch, upper buttock creas, central abdomen  Psychiatric: Her speech is normal and behavior is normal. Judgment and thought content normal. Her mood appears not anxious. Cognition and memory are normal. She does not exhibit a depressed mood.          Assessment & Plan:

## 2017-06-19 NOTE — Assessment & Plan Note (Signed)
Possible allergic dermatitis.. Unknown source. Rash is only in areas pt itching and can reach. Does not seem like antibiotic drug eruption. No toher contacts with rash/itching.. So scabies etc less likely but still on differential.  Treat with pred taper... If not improving consider treatment for scabies etc.

## 2017-06-25 ENCOUNTER — Encounter: Payer: Self-pay | Admitting: Family Medicine

## 2017-06-25 DIAGNOSIS — R21 Rash and other nonspecific skin eruption: Secondary | ICD-10-CM

## 2017-06-26 DIAGNOSIS — F329 Major depressive disorder, single episode, unspecified: Secondary | ICD-10-CM | POA: Diagnosis not present

## 2017-06-29 DIAGNOSIS — R234 Changes in skin texture: Secondary | ICD-10-CM | POA: Diagnosis not present

## 2017-06-29 DIAGNOSIS — R21 Rash and other nonspecific skin eruption: Secondary | ICD-10-CM | POA: Diagnosis not present

## 2017-06-29 DIAGNOSIS — L500 Allergic urticaria: Secondary | ICD-10-CM | POA: Diagnosis not present

## 2017-06-30 ENCOUNTER — Telehealth: Payer: Self-pay | Admitting: Family Medicine

## 2017-06-30 NOTE — Telephone Encounter (Signed)
Pt does not need referral - she went to  [facility] on emergency basis and was already seen

## 2017-06-30 NOTE — Telephone Encounter (Signed)
error 

## 2017-06-30 NOTE — Telephone Encounter (Signed)
Left message asking pt to call the office regarding referral °

## 2017-07-06 DIAGNOSIS — L501 Idiopathic urticaria: Secondary | ICD-10-CM | POA: Diagnosis not present

## 2017-07-10 ENCOUNTER — Ambulatory Visit: Payer: BLUE CROSS/BLUE SHIELD | Admitting: Obstetrics and Gynecology

## 2017-07-31 DIAGNOSIS — F329 Major depressive disorder, single episode, unspecified: Secondary | ICD-10-CM | POA: Diagnosis not present

## 2017-08-21 DIAGNOSIS — F329 Major depressive disorder, single episode, unspecified: Secondary | ICD-10-CM | POA: Diagnosis not present

## 2017-08-28 ENCOUNTER — Other Ambulatory Visit: Payer: Self-pay

## 2017-08-28 ENCOUNTER — Encounter: Payer: Self-pay | Admitting: Obstetrics and Gynecology

## 2017-08-28 ENCOUNTER — Ambulatory Visit: Payer: BLUE CROSS/BLUE SHIELD | Admitting: Obstetrics and Gynecology

## 2017-08-28 VITALS — BP 110/58 | HR 66 | Resp 18 | Ht 63.0 in | Wt 182.8 lb

## 2017-08-28 DIAGNOSIS — Z01419 Encounter for gynecological examination (general) (routine) without abnormal findings: Secondary | ICD-10-CM | POA: Diagnosis not present

## 2017-08-28 NOTE — Patient Instructions (Signed)

## 2017-08-28 NOTE — Progress Notes (Signed)
46 y.o. G23P2002 Divorced Caucasian female here for annual exam.    Menses have been regular until now.  No cycle yet this month.  Feels warm a lot.   Not sexually active.   ROS - hives of breast and trunk due to stress, diarrhea, depression.  Has a counselor, Dr. Silvio Pate.   Denies suicidal ideation.   SOC - dental hygenist.   PCP:  Owens Loffler, MD   Patient's last menstrual period was 07/20/2017 (approximate).     Period Cycle (Days): 30 Period Duration (Days): 3-4 Period Pattern: Regular Menstrual Flow: Light Menstrual Control: Maxi pad Dysmenorrhea: (!) Moderate Dysmenorrhea Symptoms: Cramping     Sexually active: No. female The current method of family planning is tubal ligation.    Exercising: No.   Smoker:  Yes, smokes 3/4 ppd  Health Maintenance: Pap: 06-27-16 Neg:Neg HR HPV, 08-26-11 Neg History of abnormal Pap:  Yes, hx HR HPV in past 5 years--but no colposcopy, no treatment MMG: 08-08-16 Density B/Neg/BiRads1:TBC Colonoscopy: 02-14-13 normal with Dr. Neoma Laming due 01/2023. BMD:   n/a  Result  n/a TDaP: 06-27-16 Gardasil:   no HIV: 12-08-16 NR Hep C:unsure Screening Labs:   Urine today: not done   reports that she has been smoking cigarettes.  She has been smoking about 0.50 packs per day. she has never used smokeless tobacco. She reports that she drinks alcohol. She reports that she does not use drugs.  Past Medical History:  Diagnosis Date  . Anxiety   . Depression   . H. pylori infection 2018   treated with abx  . OCD (obsessive compulsive disorder)   . Seasonal allergies   . Social anxiety disorder 09/12/2013    Past Surgical History:  Procedure Laterality Date  . NASAL SEPTUM SURGERY    . OOPHORECTOMY  10/14/2012   Left ovary removed--hx "ruptured chocolate cyst"  . TONSILLECTOMY      Current Outpatient Medications  Medication Sig Dispense Refill  . fexofenadine (ALLEGRA) 60 MG tablet Take 60 mg 2 (two) times daily by mouth.    Marland Kitchen ibuprofen  (ADVIL,MOTRIN) 800 MG tablet Take 800 mg by mouth every 8 (eight) hours as needed for pain.    Marland Kitchen omeprazole (PRILOSEC) 20 MG capsule   0  . Probiotic Product (RESTORA) CAPS Take by mouth daily.  6  . propranolol (INDERAL) 10 MG tablet 1 - 2 tablets 30 minutes before social situation 60 tablet 5  . sertraline (ZOLOFT) 100 MG tablet Take 1 tablet (100 mg total) by mouth daily. 90 tablet 3   No current facility-administered medications for this visit.     Family History  Problem Relation Age of Onset  . Depression Mother   . Fibromyalgia Mother   . Breast cancer Mother 30  . Rheum arthritis Mother   . Alcohol abuse Father   . Emphysema Maternal Grandmother        Dec COPD  . Breast cancer Maternal Grandmother 40  . Multiple sclerosis Maternal Grandfather   . Breast cancer Paternal Aunt 85       A & W    ROS:  Pertinent items are noted in HPI.  Otherwise, a comprehensive ROS was negative.  Exam:   BP (!) 110/58 (BP Location: Right Arm, Patient Position: Sitting, Cuff Size: Normal)   Pulse 66   Resp 18   Ht 5\' 3"  (1.6 m)   Wt 182 lb 12.8 oz (82.9 kg)   LMP 07/20/2017 (Approximate)   BMI 32.38 kg/m  General appearance: alert, cooperative and appears stated age Head: Normocephalic, without obvious abnormality, atraumatic Neck: no adenopathy, supple, symmetrical, trachea midline and thyroid normal to inspection and palpation Lungs: clear to auscultation bilaterally Breasts: normal appearance, no masses or tenderness, No nipple retraction or dimpling, No nipple discharge or bleeding, No axillary or supraclavicular adenopathy Heart: regular rate and rhythm Abdomen: soft, non-tender; no masses, no organomegaly Extremities: extremities normal, atraumatic, no cyanosis or edema Skin: Skin color, texture, turgor normal. No rashes or lesions Lymph nodes: Cervical, supraclavicular, and axillary nodes normal. No abnormal inguinal nodes palpated Neurologic: Grossly normal  Pelvic:  External genitalia:  no lesions              Urethra:  normal appearing urethra with no masses, tenderness or lesions              Bartholins and Skenes: normal                 Vagina: normal appearing vagina with normal color and discharge, no lesions              Cervix: no lesions              Pap taken: No. Bimanual Exam:  Uterus:  normal size, contour, position, consistency, mobility, non-tender              Adnexa: no mass, fullness, tenderness              Rectal exam: Yes.  .  Confirms.              Anus:  normal sphincter tone, hemorrhoids palpable just inside the anal opening.   Chaperone was present for exam.  Assessment:   Well woman visit with normal exam.   FH of breast cancer.  Genetic testing negative for mother and sister. Remote hx of HPV.  Status post BTL and endometrial ablation.  Status post laparoscopic LSO and LOA for ruptured endometrioma. Smoker.  Anxiety/stress/depression.  On Zoloft.   Plan: Mammogram screening recommended. She will schedule. Recommended self breast awareness. Pap and HR HPV as above. Guidelines for Calcium, Vitamin D, regular exercise program including cardiovascular and weight bearing exercise. Routine labs. We discussed her seeing her counselor for support.  Continue Zoloft through PCP. Follow up annually and prn.   After visit summary provided.

## 2017-08-29 LAB — CBC
Hematocrit: 42.1 % (ref 34.0–46.6)
Hemoglobin: 14 g/dL (ref 11.1–15.9)
MCH: 30.8 pg (ref 26.6–33.0)
MCHC: 33.3 g/dL (ref 31.5–35.7)
MCV: 93 fL (ref 79–97)
Platelets: 200 10*3/uL (ref 150–379)
RBC: 4.55 x10E6/uL (ref 3.77–5.28)
RDW: 12.3 % (ref 12.3–15.4)
WBC: 8.3 10*3/uL (ref 3.4–10.8)

## 2017-08-29 LAB — COMPREHENSIVE METABOLIC PANEL
ALT: 12 IU/L (ref 0–32)
AST: 12 IU/L (ref 0–40)
Albumin/Globulin Ratio: 1.8 (ref 1.2–2.2)
Albumin: 4.6 g/dL (ref 3.5–5.5)
Alkaline Phosphatase: 70 IU/L (ref 39–117)
BUN/Creatinine Ratio: 15 (ref 9–23)
BUN: 13 mg/dL (ref 6–24)
Bilirubin Total: 0.5 mg/dL (ref 0.0–1.2)
CO2: 24 mmol/L (ref 20–29)
Calcium: 9.3 mg/dL (ref 8.7–10.2)
Chloride: 103 mmol/L (ref 96–106)
Creatinine, Ser: 0.89 mg/dL (ref 0.57–1.00)
GFR calc Af Amer: 91 mL/min/{1.73_m2} (ref >59)
GFR calc non Af Amer: 79 mL/min/{1.73_m2} (ref >59)
Globulin, Total: 2.5 g/dL (ref 1.5–4.5)
Glucose: 67 mg/dL (ref 65–99)
Potassium: 4.9 mmol/L (ref 3.5–5.2)
Sodium: 141 mmol/L (ref 134–144)
Total Protein: 7.1 g/dL (ref 6.0–8.5)

## 2017-08-29 LAB — TSH: TSH: 1.55 u[IU]/mL (ref 0.450–4.500)

## 2017-08-29 LAB — LIPID PANEL
Chol/HDL Ratio: 3.1 ratio (ref 0.0–4.4)
Cholesterol, Total: 185 mg/dL (ref 100–199)
HDL: 59 mg/dL (ref >39)
LDL Calculated: 112 mg/dL — ABNORMAL HIGH (ref 0–99)
Triglycerides: 70 mg/dL (ref 0–149)
VLDL Cholesterol Cal: 14 mg/dL (ref 5–40)

## 2017-10-09 DIAGNOSIS — F329 Major depressive disorder, single episode, unspecified: Secondary | ICD-10-CM | POA: Diagnosis not present

## 2017-11-09 ENCOUNTER — Other Ambulatory Visit: Payer: Self-pay | Admitting: Obstetrics and Gynecology

## 2017-11-09 DIAGNOSIS — Z139 Encounter for screening, unspecified: Secondary | ICD-10-CM

## 2017-11-12 ENCOUNTER — Ambulatory Visit
Admission: RE | Admit: 2017-11-12 | Discharge: 2017-11-12 | Disposition: A | Discharge status: Home / Self Care | Payer: BLUE CROSS/BLUE SHIELD | Source: Ambulatory Visit | Attending: Obstetrics and Gynecology | Admitting: Obstetrics and Gynecology

## 2017-11-12 DIAGNOSIS — Z1231 Encounter for screening mammogram for malignant neoplasm of breast: Secondary | ICD-10-CM | POA: Diagnosis not present

## 2017-11-12 DIAGNOSIS — Z139 Encounter for screening, unspecified: Secondary | ICD-10-CM

## 2017-11-12 IMAGING — MG 2D DIGITAL SCREENING BILATERAL MAMMOGRAM WITH 3D TOMO WITH CAD
9 of 12 series · 9 of 28 positions shown · non-contrast
Comparison: Previous exam(s).

CLINICAL DATA: Screening.

EXAM:
2D DIGITAL SCREENING BILATERAL MAMMOGRAM WITH 3D TOMO WITH CAD

[L CC synth-2D]
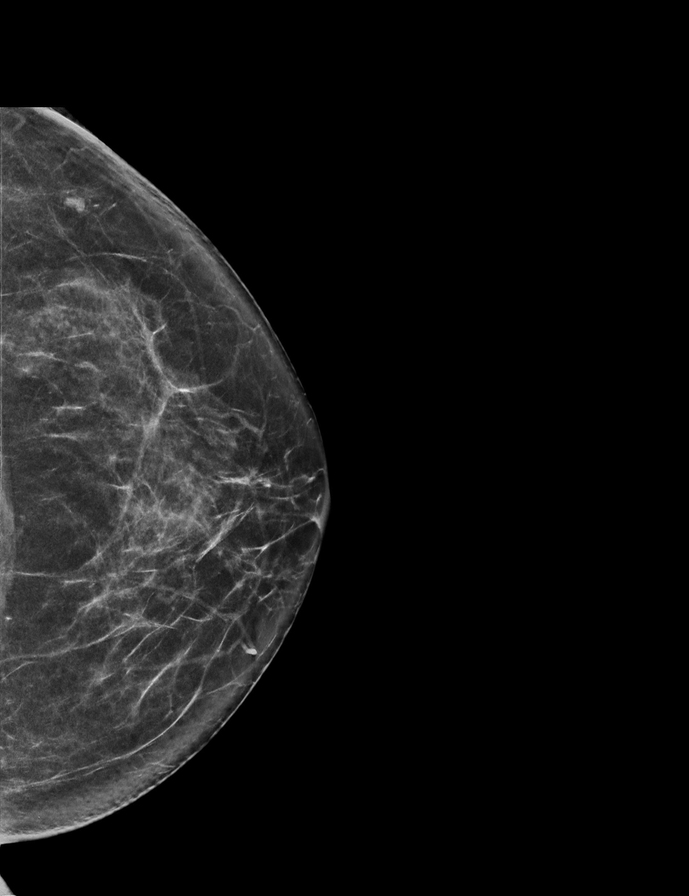

[R MLO synth-2D]
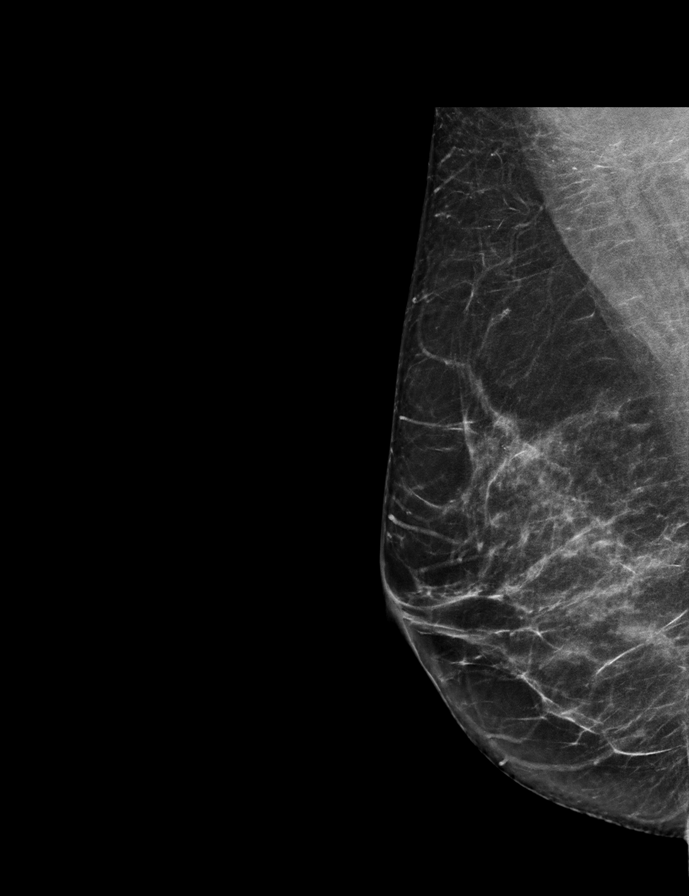

[L CC]
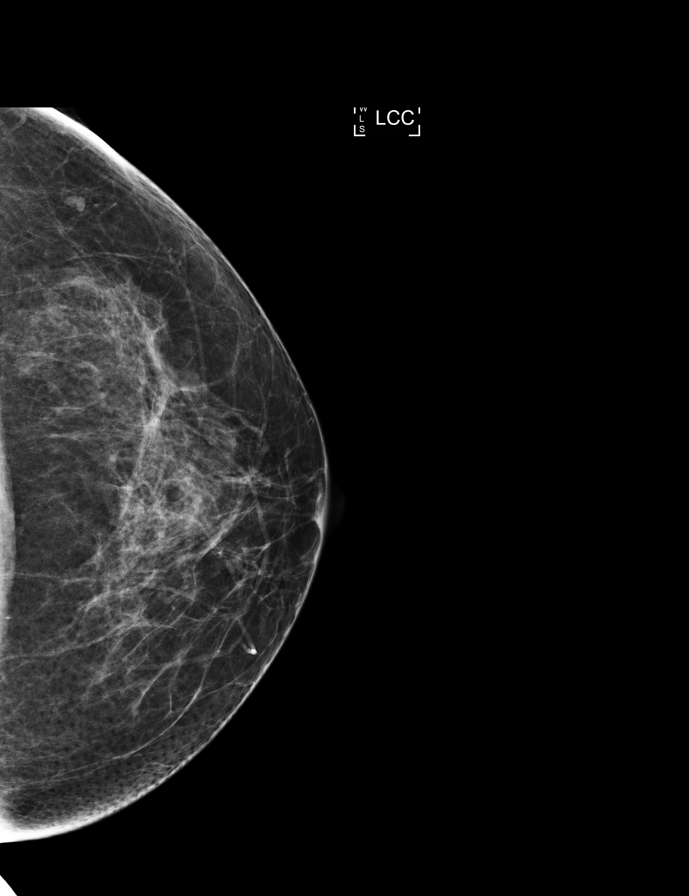

[R CC synth-2D]
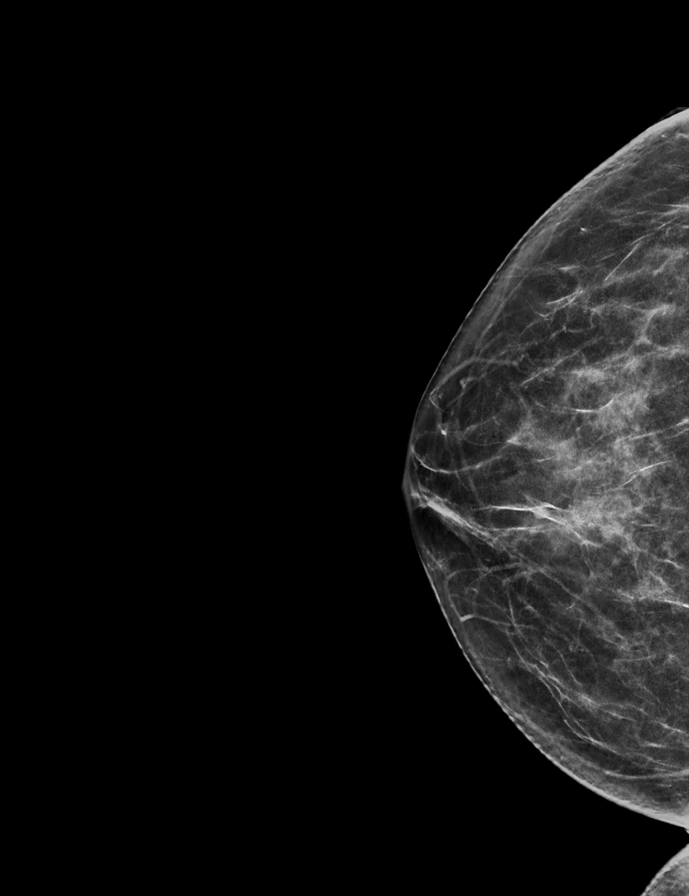

[L MLO synth-2D]
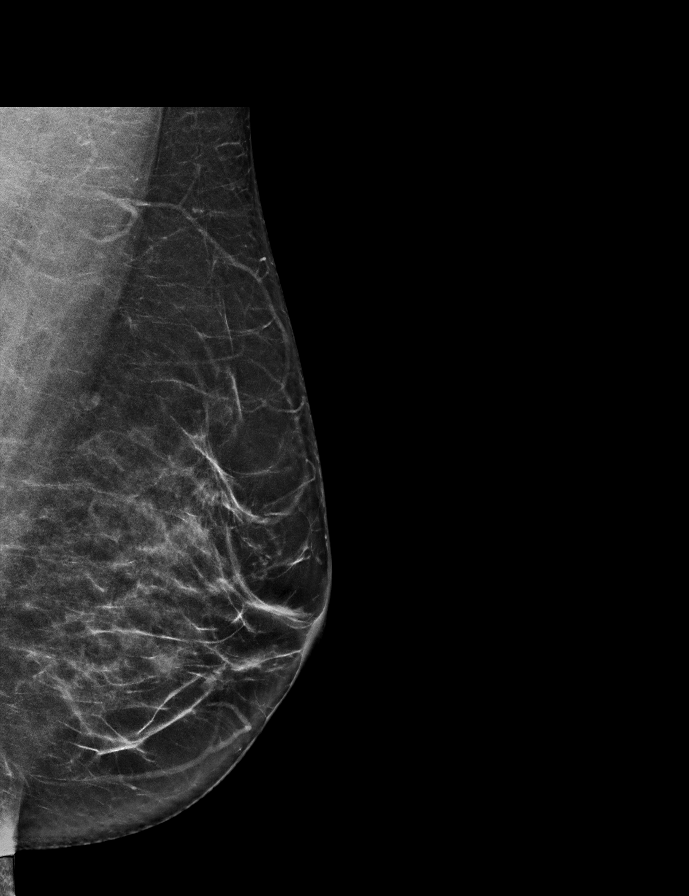

[R MLO]
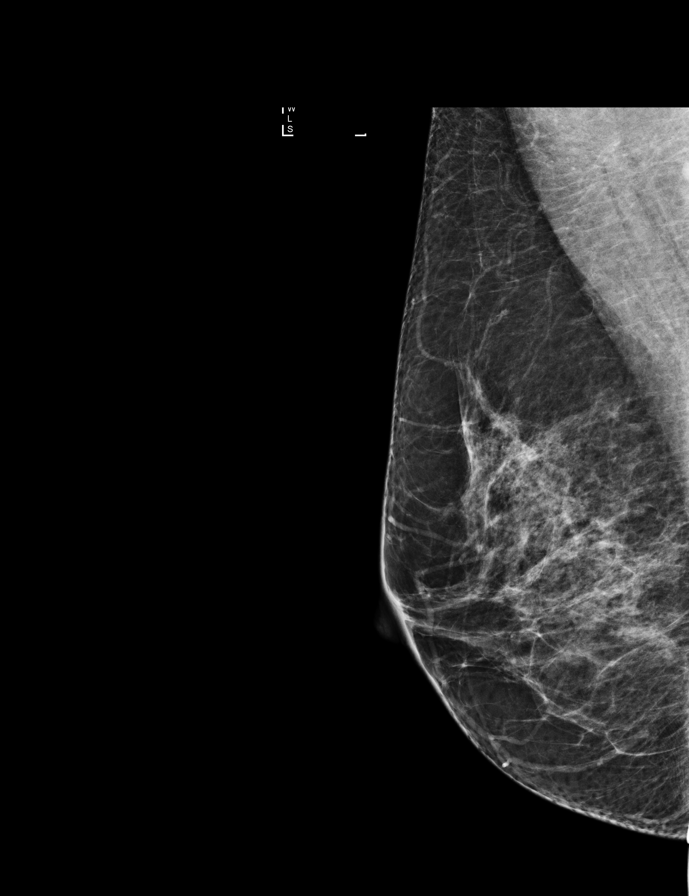

[R CC]
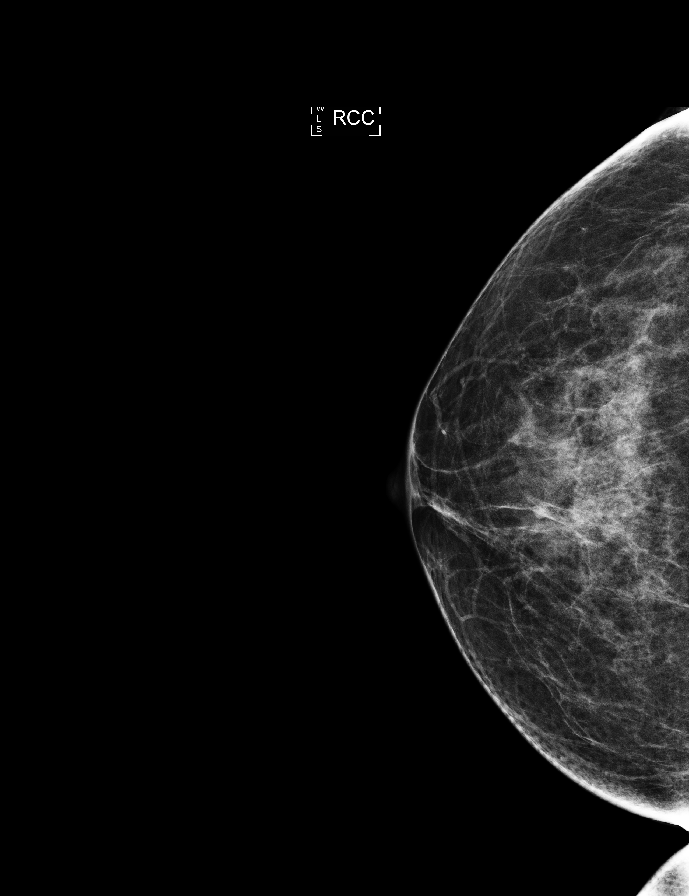

[L MLO]
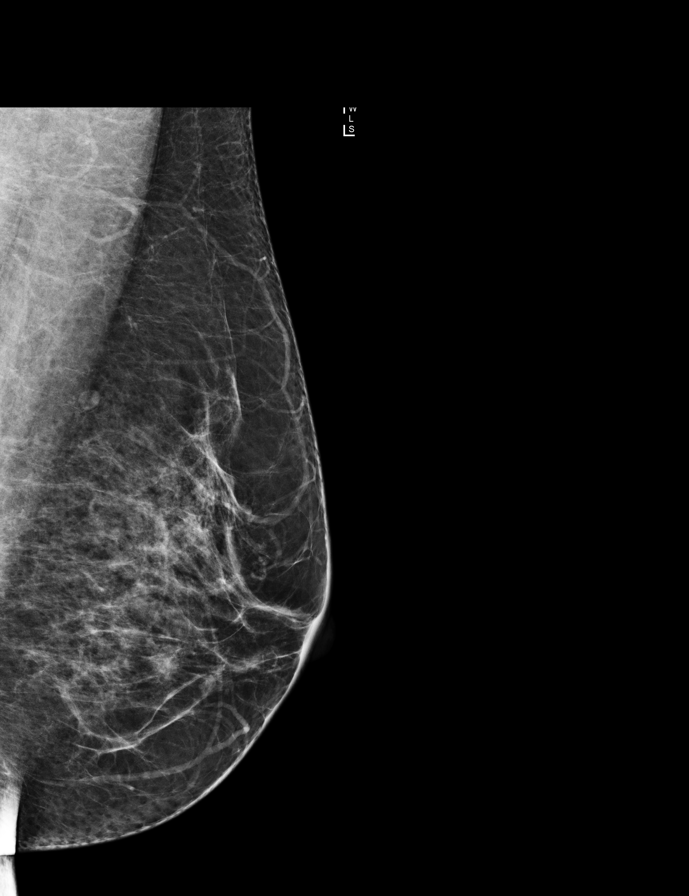

[L CC tomo · tomo slice 34/67.0]
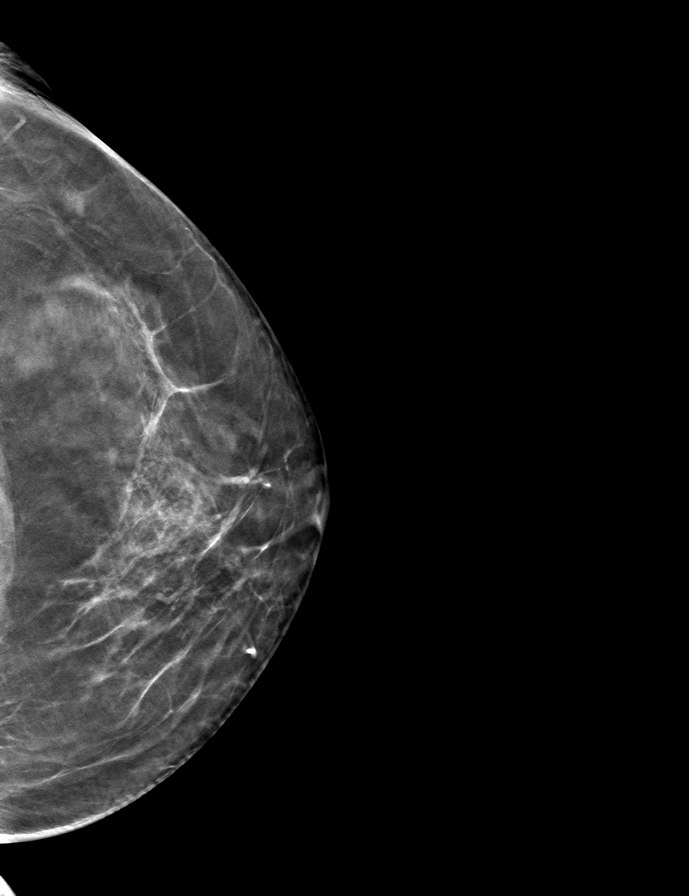

[9 of 28 positions shown; findings below may reference images not displayed]

ACR Breast Density Category c: The breast tissue is heterogeneously
dense, which may obscure small masses.
FINDINGS: There are no findings suspicious for malignancy. Images were
processed with CAD.
IMPRESSION: No mammographic evidence of malignancy. A result letter of this
screening mammogram will be mailed directly to the patient.

RECOMMENDATION:
Screening mammogram in one year. (Code:[4B])

BI-RADS CATEGORY  1: Negative.

## 2018-01-13 DIAGNOSIS — J019 Acute sinusitis, unspecified: Secondary | ICD-10-CM | POA: Diagnosis not present

## 2018-02-25 ENCOUNTER — Other Ambulatory Visit: Payer: Self-pay | Admitting: Family Medicine

## 2018-03-02 ENCOUNTER — Telehealth: Payer: Self-pay | Admitting: Family Medicine

## 2018-03-02 NOTE — Telephone Encounter (Signed)
Amy Marks is very nice and has been my patient for basically the whole time I have been at our office. She and her whole family are some of my nicest patients. There should be no barriers to care.

## 2018-03-02 NOTE — Telephone Encounter (Signed)
See below crm Is it ok to transfer care?  Copied from [place] [id number]. Topic: Appointment Scheduling - Scheduling Inquiry for Clinic >> Mar 02, 2018 10:57 AM Arletha Grippe wrote: Reason for CRM: pt needs to switch providers due to the fact that she can only come in on fridays.  She would like to switch to [facility].  It has nothing to do with the care that Dr Lorelei Pont gives, she really likes him.  She is just not able to take off work and Fridays are the only day that she can come in.  Please call back at [phone]

## 2018-03-03 NOTE — Telephone Encounter (Signed)
That is fine with me. Please schedule at her convenience.

## 2018-03-04 NOTE — Telephone Encounter (Signed)
Left message asking pt to call office  °

## 2018-03-26 ENCOUNTER — Encounter: Payer: Self-pay | Admitting: Family Medicine

## 2018-03-26 ENCOUNTER — Ambulatory Visit: Payer: BLUE CROSS/BLUE SHIELD | Admitting: Family Medicine

## 2018-03-26 VITALS — BP 100/60 | HR 80 | Temp 98.3°F | Wt 191.0 lb

## 2018-03-26 DIAGNOSIS — F419 Anxiety disorder, unspecified: Secondary | ICD-10-CM

## 2018-03-26 DIAGNOSIS — F329 Major depressive disorder, single episode, unspecified: Secondary | ICD-10-CM | POA: Diagnosis not present

## 2018-03-26 DIAGNOSIS — Z803 Family history of malignant neoplasm of breast: Secondary | ICD-10-CM | POA: Diagnosis not present

## 2018-03-26 DIAGNOSIS — H1013 Acute atopic conjunctivitis, bilateral: Secondary | ICD-10-CM

## 2018-03-26 DIAGNOSIS — F32A Depression, unspecified: Secondary | ICD-10-CM

## 2018-03-26 MED ORDER — SERTRALINE HCL 100 MG PO TABS
100.0000 mg | ORAL_TABLET | Freq: Every day | ORAL | 3 refills | Status: DC
Start: 2018-03-26 — End: 2019-05-24

## 2018-03-26 NOTE — Patient Instructions (Addendum)
For menstrual back pain- try two Alleve (generic is fine) every 12 hours as needed  Try antihistamine eye drops per package instruction Can also use a lubricating eye drop/liquid tears  Work on walking daily- start for 15-20 minutes and work up to 30 days  Allergic Conjunctivitis A clear membrane (conjunctiva) covers the white part of your eye and the inner surface of your eyelid. Allergic conjunctivitis happens when this membrane has inflammation. This is caused by allergies. Common causes of allergic reactions (allergens)include:  Outdoor allergens, such as: ? Pollen. ? Grass and weeds. ? Mold spores.  Indoor allergens, such as: ? Dust. ? Smoke. ? Mold. ? Pet dander. ? Animal hair.  This condition can make your eye red or pink. It can also make your eye feel itchy. This condition cannot be spread from one person to another person (is not contagious). Follow these instructions at home:  Try not to be around things that you are allergic to.  Take or apply over-the-counter and prescription medicines only as told by your doctor. These include any eye drops.  Place a cool, clean washcloth on your eye for 10-20 minutes. Do this 3-4 times a day.  Do not touch or rub your eyes.  Do not wear contact lenses until the inflammation is gone. Wear glasses instead.  Do not wear eye makeup until the inflammation is gone.  Keep all follow-up visits as told by your doctor. This is important. Contact a doctor if:  Your symptoms get worse.  Your symptoms do not get better with treatment.  You have mild eye pain.  You are sensitive to light,  You have spots or blisters on your eyes.  You have pus coming from your eye.  You have a fever. Get help right away if:  You have redness, swelling, or other symptoms in only one eye.  Your vision is blurry.  You have vision changes.  You have very bad eye pain. Summary  Allergic conjunctivitis is caused by allergies. It can make  your eye red or pink, and it can make your eye feel itchy.  This condition cannot be spread from one person to another person (is not contagious).  Try not to be around things that you are allergic to.  Take or apply over-the-counter and prescription medicines only as told by your doctor. These include any eye drops.  Contact your doctor if your symptoms get worse or they do not get better with treatment. This information is not intended to replace advice given to you by your health care provider. Make sure you discuss any questions you have with your health care provider. Document Released: 03/26/2010 Document Revised: 05/30/2016 Document Reviewed: 05/30/2016 Elsevier Interactive Patient Education  2017 [employer].

## 2018-03-26 NOTE — Progress Notes (Signed)
Subjective:    Patient ID: Amy Marks, female    DOB: 11-Jan-1971, 47 y.o.   MRN: [id number]  HPI This is a 47 yo female who presents today to establish care, transferring from Dr. Lorelei Pont. She is divorced and shares custody of her children with her ex. She has a 80 yo daughter and 50 yo son. She works in [employer].     Last CPE- 08/28/17 gyn with labs Mammo- 11/13/17 Pap- 06/27/16- negative/negative HPV, repeat 2022 Colonoscopy- 02/14/13 Tdap- 06/27/16 Flu- annual Exercise- not regular Sleep- sleeps well, doesn't feel rested    Sister who is 65 was recently diagnosed with breast cancer. Will have lumpectomy with radiation. Mother and grandmother both with breast cancer. Patient had negative mammogram 1/19. Per patient, negative genetic screening.   Periods monthly. Bad back pain x 1-2 days, No relief with ibuprofen.   Depression and anxiety- feels like sertraline 100 mg helps. Rare panic attacks, takes propranolol rarely with good relief. Battling with her ex about custody. They currently have 50/50 arrangement, but children don't want to spend time with their dad. Currently working on things through the court system. Good support from family and church. Daughter in counseling, she is going to get son into counseling.   Eyes itchy, red, puffy, watery. Has been using Visine with temporary relief.      Past Medical History:  Diagnosis Date  . Anxiety   . Depression   . H. pylori infection 2018   treated with abx  . OCD (obsessive compulsive disorder)   . Seasonal allergies   . Social anxiety disorder 09/12/2013   Past Surgical History:  Procedure Laterality Date  . DILITATION & CURRETTAGE/HYSTROSCOPY WITH NOVASURE ABLATION  09/03/2012   Procedure: DILATATION & CURETTAGE/HYSTEROSCOPY WITH NOVASURE ABLATION;  Surgeon: Daria Pastures, MD;  Location: [facility];  Service: Gynecology;  Laterality: N/A;  . LAPAROSCOPIC TUBAL LIGATION  09/03/2012   Procedure: LAPAROSCOPIC TUBAL  LIGATION;  Surgeon: Daria Pastures, MD;  Location: [facility];  Service: Gynecology;  Laterality: Bilateral;  FILSHIE CLIPS  . LAPAROSCOPY  10/14/2012   Procedure: LAPAROSCOPY OPERATIVE with LSO and LOA;  Surgeon: Farrel Gobble. Harrington Challenger, MD;  Location: [facility];  Service: Gynecology;  Laterality: N/A;  . LAPAROTOMY  10/14/2012   Procedure: EXPLORATORY LAPAROTOMY;  Surgeon: Farrel Gobble. Harrington Challenger, MD;  Location: [facility];  Service: Gynecology;  Laterality: N/A;  . NASAL SEPTUM SURGERY    . OOPHORECTOMY  10/14/2012   Left ovary removed--hx "ruptured chocolate cyst"  . TONSILLECTOMY     Family History  Problem Relation Age of Onset  . Depression Mother   . Fibromyalgia Mother   . Breast cancer Mother 18  . Rheum arthritis Mother   . Alcohol abuse Father   . Emphysema Maternal Grandmother        Dec COPD  . Breast cancer Maternal Grandmother 41  . Multiple sclerosis Maternal Grandfather   . Breast cancer Paternal Aunt 71       A & W   Social History   Tobacco Use  . Smoking status: Current Every Day Smoker    Packs/day: 0.50    Types: Cigarettes  . Smokeless tobacco: Never Used  . Tobacco comment: started smoking again 2015  Substance Use Topics  . Alcohol use: Yes    Comment: rarely--4-5 drinks/year  . Drug use: No      Review of Systems  Constitutional: Positive for fatigue (chronic).  HENT: Positive for postnasal drip and rhinorrhea.  Eyes: Positive for redness and itching. Negative for visual disturbance.  Gastrointestinal: Positive for diarrhea (several x daily, worse with increased stress. Negative colon/egd. ).  Musculoskeletal: Positive for back pain (with menses).  Allergic/Immunologic: Positive for environmental allergies.  Psychiatric/Behavioral: Negative for sleep disturbance.   Per HPI    Objective:   Physical Exam  Constitutional: She is oriented to person, place, and time. She appears well-developed and well-nourished. No distress.  HENT:  Head: Normocephalic and  atraumatic.  Eyes: Right eye exhibits no discharge. Left eye exhibits no discharge. Right conjunctiva is injected. Left conjunctiva is injected.  Cardiovascular: Normal rate.  Pulmonary/Chest: Effort normal.  Neurological: She is alert and oriented to person, place, and time.  Skin: Skin is warm and dry. She is not diaphoretic.  Psychiatric: She has a normal mood and affect. Her behavior is normal. Judgment and thought content normal.  Vitals reviewed.     BP 100/60 (BP Location: Right Arm, Patient Position: Sitting, Cuff Size: Large)   Pulse 80   Temp 98.3 F (36.8 C) (Oral)   Wt 191 lb (86.6 kg)   LMP 03/11/2018   SpO2 98%   BMI 33.83 kg/m  Wt Readings from Last 3 Encounters:  03/26/18 191 lb (86.6 kg)  08/28/17 182 lb 12.8 oz (82.9 kg)  06/19/17 180 lb 8 oz (81.9 kg)       Assessment & Plan:  1. Anxiety and depression - patient feels like she is coping well, will continue current dose of sertraline.  - encouraged her to walk regularly, consider yoga - sertraline (ZOLOFT) 100 MG tablet; Take 1 tablet (100 mg total) by mouth daily.  Dispense: 90 tablet; Refill: 3 - will need to see at least annually, also sees gyn, can alternate so she is seeing one of [place] about every 6 months - RTC sooner if worsening mood or any concerns.  2. Allergic conjunctivitis of both eyes -Provided written and verbal information regarding diagnosis and treatment.  3. Family history of breast cancer - up to date on mammogram - encouraged monthly self breast exams and to report any change   Clarene Reamer, FNP-BC  [facility]  03/26/2018 1:18 PM

## 2018-09-03 DIAGNOSIS — H1045 Other chronic allergic conjunctivitis: Secondary | ICD-10-CM | POA: Diagnosis not present

## 2018-09-23 NOTE — Progress Notes (Signed)
47 y.o. G37P2002 Divorced Caucasian female here for annual exam.    28.3% lifetime risk of breast cancer by [place].  Has a new partner.   PCP:  Clarene Reamer, NP   No LMP recorded.     Period Cycle (Days): 30 Period Duration (Days): 4 days Period Pattern: Regular Menstrual Flow: (light to moderate) Menstrual Control: Maxi pad Menstrual Control Change Freq (Hours): every 4-5 hours on heaviest day Dysmenorrhea: (!) Moderate(back cramps/pain) Dysmenorrhea Symptoms: Cramping, Diarrhea     Sexually active: Yes.   female The current method of family planning is tubal ligation.    Exercising: No.   Smoker:  Yes, smokes 1/2ppd  Health Maintenance: Pap: 06-27-16 Neg:Neg HR HPV, 08-26-11 Neg History of abnormal Pap:  Yes, early 20's had HPV ?LEEP procedure, paps normal since MMG: 11-12-17 3D Neg/density c/BiRads1 Colonoscopy:  02-14-13 normal;next 10 years BMD:   n/a  Result  n/a TDaP:  06-27-16 Gardasil:   no HIV: 12-08-16 NR Hep C: unsure Screening Labs:  Hb today: PCP    reports that she has been smoking cigarettes. She has been smoking about 0.50 packs per day. She has never used smokeless tobacco. She reports that she drinks alcohol. She reports that she does not use drugs.  Past Medical History:  Diagnosis Date  . Anxiety   . Depression   . H. pylori infection 2018   treated with abx  . OCD (obsessive compulsive disorder)   . Seasonal allergies   . Social anxiety disorder 09/12/2013    Past Surgical History:  Procedure Laterality Date  . DILITATION & CURRETTAGE/HYSTROSCOPY WITH NOVASURE ABLATION  09/03/2012   Procedure: DILATATION & CURETTAGE/HYSTEROSCOPY WITH NOVASURE ABLATION;  Surgeon: Daria Pastures, MD;  Location: [facility];  Service: Gynecology;  Laterality: N/A;  . LAPAROSCOPIC TUBAL LIGATION  09/03/2012   Procedure: LAPAROSCOPIC TUBAL LIGATION;  Surgeon: Daria Pastures, MD;  Location: [facility];  Service: Gynecology;  Laterality: Bilateral;  FILSHIE  CLIPS  . LAPAROSCOPY  10/14/2012   Procedure: LAPAROSCOPY OPERATIVE with LSO and LOA;  Surgeon: Farrel Gobble. Harrington Challenger, MD;  Location: [facility];  Service: Gynecology;  Laterality: N/A;  . LAPAROTOMY  10/14/2012   Procedure: EXPLORATORY LAPAROTOMY;  Surgeon: Farrel Gobble. Harrington Challenger, MD;  Location: [facility];  Service: Gynecology;  Laterality: N/A;  . NASAL SEPTUM SURGERY    . OOPHORECTOMY  10/14/2012   Left ovary removed--hx "ruptured chocolate cyst"  . TONSILLECTOMY      Current Outpatient Medications  Medication Sig Dispense Refill  . fexofenadine (ALLEGRA) 60 MG tablet Take 60 mg 2 (two) times daily by mouth.    Marland Kitchen ibuprofen (ADVIL,MOTRIN) 800 MG tablet Take 800 mg by mouth every 8 (eight) hours as needed for pain.    Marland Kitchen neomycin-polymyxin b-dexamethasone (MAXITROL) 3.5-10000-0.1 OINT   0  . Olopatadine HCl 0.2 % SOLN   6  . omeprazole (PRILOSEC) 20 MG capsule   0  . propranolol (INDERAL) 10 MG tablet 1 - 2 tablets 30 minutes before social situation 60 tablet 5  . sertraline (ZOLOFT) 100 MG tablet Take 1 tablet (100 mg total) by mouth daily. 90 tablet 3   No current facility-administered medications for this visit.     Family History  Problem Relation Age of Onset  . Depression Mother   . Fibromyalgia Mother   . Breast cancer Mother 37  . Rheum arthritis Mother   . Alcohol abuse Father   . Emphysema Maternal Grandmother  Dec COPD  . Breast cancer Maternal Grandmother 57  . Multiple sclerosis Maternal Grandfather   . Breast cancer Sister 102  . Breast cancer Paternal Aunt 82       A & W    Review of Systems  All other systems reviewed and are negative.   Exam:   BP 124/80 (BP Location: Right Arm, Patient Position: Sitting, Cuff Size: Large)   Pulse 76   Resp 20   Ht 5' 2.75" (1.594 m)   Wt 193 lb (87.5 kg)   BMI 34.46 kg/m     General appearance: alert, cooperative and appears stated age Head: Normocephalic, without obvious abnormality, atraumatic Neck: no adenopathy, supple,  symmetrical, trachea midline and thyroid normal to inspection and palpation Lungs: clear to auscultation bilaterally Breasts: normal appearance, no masses or tenderness, No nipple retraction or dimpling, No nipple discharge or bleeding, No axillary or supraclavicular adenopathy Heart: regular rate and rhythm Abdomen: soft, non-tender; no masses, no organomegaly Extremities: extremities normal, atraumatic, no cyanosis or edema Skin: Skin color, texture, turgor normal. No rashes or lesions Lymph nodes: Cervical, supraclavicular, and axillary nodes normal. No abnormal inguinal nodes palpated Neurologic: Grossly normal  Pelvic: External genitalia:  no lesions              Urethra:  normal appearing urethra with no masses, tenderness or lesions              Bartholins and Skenes: normal                 Vagina: normal appearing vagina with normal color and discharge, no lesions              Cervix: no lesions              Pap taken: No. Bimanual Exam:  Uterus:  normal size, contour, position, consistency, mobility, non-tender              Adnexa: no mass, fullness, tenderness              Rectal exam: Yes.  .  Confirms.              Anus:  normal sphincter tone, no lesions  Chaperone was present for exam.  Assessment:   Well woman visit with normal exam. FH of breast cancer.Genetic testing negative for mother and sister. Increased lifetime risk of breast cancer:  28.3% Remote hx of HPV.  Status post BTL and endometrial ablation.  Status post laparoscopic LSO and LOA for ruptured endometrioma. Smoker.  Anxiety/stress/depression.  On Zoloft.  STD screening.  Plan: Mammogram screening. Recommended self breast awareness. Pap and HR HPV as above. Guidelines for Calcium, Vitamin D, regular exercise program including cardiovascular and weight bearing exercise. Labs with PCP. Flu vaccine recommended. Breast MRI with contrast and consultation with Dr. Jana Hakim.  IFOB. STD  screening. Follow up annually and prn.   After visit summary provided.

## 2018-09-24 ENCOUNTER — Ambulatory Visit: Payer: BLUE CROSS/BLUE SHIELD | Admitting: Obstetrics and Gynecology

## 2018-09-24 ENCOUNTER — Encounter: Payer: Self-pay | Admitting: Obstetrics and Gynecology

## 2018-09-24 ENCOUNTER — Other Ambulatory Visit: Payer: Self-pay

## 2018-09-24 ENCOUNTER — Other Ambulatory Visit (HOSPITAL_COMMUNITY)
Admission: RE | Admit: 2018-09-24 | Discharge: 2018-09-24 | Disposition: A | Discharge status: Home / Self Care | Payer: BLUE CROSS/BLUE SHIELD | Source: Ambulatory Visit | Attending: Obstetrics and Gynecology | Admitting: Obstetrics and Gynecology

## 2018-09-24 VITALS — BP 124/80 | HR 76 | Resp 20 | Ht 62.75 in | Wt 193.0 lb

## 2018-09-24 DIAGNOSIS — Z113 Encounter for screening for infections with a predominantly sexual mode of transmission: Secondary | ICD-10-CM

## 2018-09-24 DIAGNOSIS — Z1211 Encounter for screening for malignant neoplasm of colon: Secondary | ICD-10-CM | POA: Diagnosis not present

## 2018-09-24 DIAGNOSIS — Z01419 Encounter for gynecological examination (general) (routine) without abnormal findings: Secondary | ICD-10-CM | POA: Diagnosis not present

## 2018-09-24 DIAGNOSIS — Z9189 Other specified personal risk factors, not elsewhere classified: Secondary | ICD-10-CM

## 2018-09-24 NOTE — Patient Instructions (Signed)

## 2018-09-25 LAB — HEP, RPR, HIV PANEL
HIV Screen 4th Generation wRfx: NONREACTIVE
Hepatitis B Surface Ag: NEGATIVE
RPR Ser Ql: NONREACTIVE

## 2018-09-25 LAB — HEPATITIS C ANTIBODY: Hep C Virus Ab: <0.1 s/co ratio (ref 0.0–0.9)

## 2018-09-27 LAB — CERVICOVAGINAL ANCILLARY ONLY
Chlamydia: NEGATIVE
Neisseria Gonorrhea: NEGATIVE
Trichomonas: NEGATIVE

## 2018-11-30 ENCOUNTER — Other Ambulatory Visit: Payer: Self-pay | Admitting: Family Medicine

## 2018-11-30 DIAGNOSIS — Z1231 Encounter for screening mammogram for malignant neoplasm of breast: Secondary | ICD-10-CM

## 2018-12-15 ENCOUNTER — Telehealth: Payer: Self-pay | Admitting: Obstetrics and Gynecology

## 2018-12-15 NOTE — Telephone Encounter (Signed)
Call placed to patient requesting a return call. Per Manus Gunning with [facility] has been in contact with patient to schedule recommended breast MRI. Manus Gunning advises, patient was to call with next cycle to schedule, but has not called back. Manus Gunning mentioned patient is scheduled for a mammogram on 12/24/2018.  I have left a voicemail message requesting a return call from patient.   cc: Dr Quincy Simmonds  cc: Karen Chafe, RN

## 2018-12-16 NOTE — Telephone Encounter (Signed)
Patient choice if she wishes to proceed with breast MRI.

## 2018-12-16 NOTE — Telephone Encounter (Signed)
Patient returned call today. She advised she is "just not able to do the breast MRI right now. It's not that I'm not going to do it, I just can't do it now." Patient advises she has scheduled a mammogram on 12/24/2018, but will look at doing the breast MRI in the future.  Forwarding to Dr Quincy Simmonds for review  cc: Karen Chafe, RN

## 2018-12-20 NOTE — Telephone Encounter (Signed)
MRI breast order will remain in workque until mammogram completed on 12/24/2018. Will close encounter   cc: Dr Quincy Simmonds  cc: Karen Chafe, RN

## 2018-12-24 ENCOUNTER — Ambulatory Visit
Admission: RE | Admit: 2018-12-24 | Discharge: 2018-12-24 | Disposition: A | Discharge status: Home / Self Care | Payer: BLUE CROSS/BLUE SHIELD | Source: Ambulatory Visit | Attending: Family Medicine | Admitting: Family Medicine

## 2018-12-24 DIAGNOSIS — Z1231 Encounter for screening mammogram for malignant neoplasm of breast: Secondary | ICD-10-CM | POA: Diagnosis not present

## 2018-12-24 IMAGING — MG DIGITAL SCREENING BILATERAL MAMMOGRAM WITH TOMO AND CAD
8 series · 8 of 24 positions shown · non-contrast
Comparison: Previous exam(s).

CLINICAL DATA: Screening.

EXAM:
DIGITAL SCREENING BILATERAL MAMMOGRAM WITH TOMO AND CAD

[R MLO synth-2D]
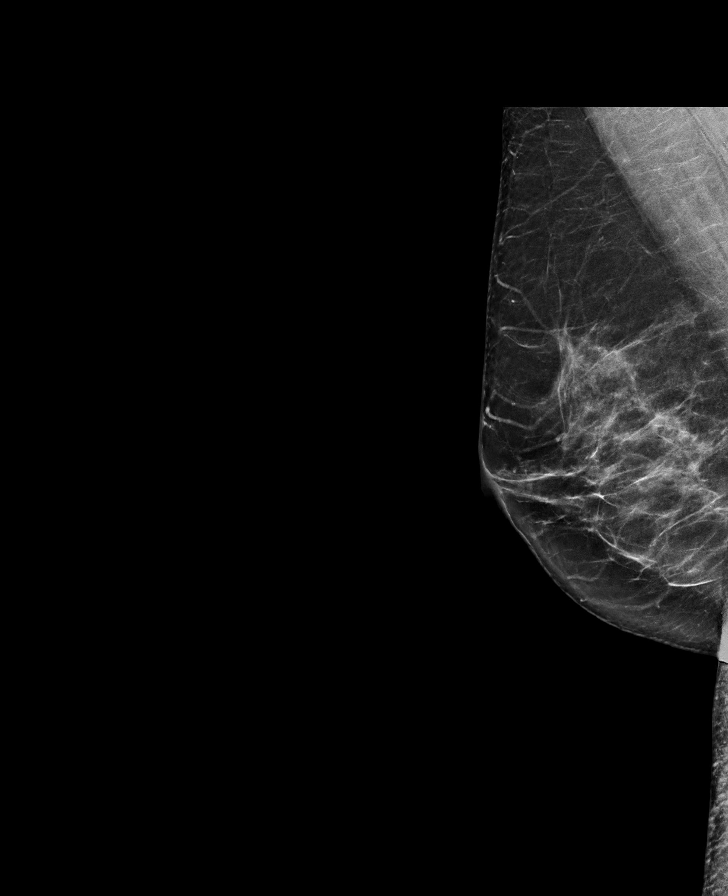

[R CC synth-2D]
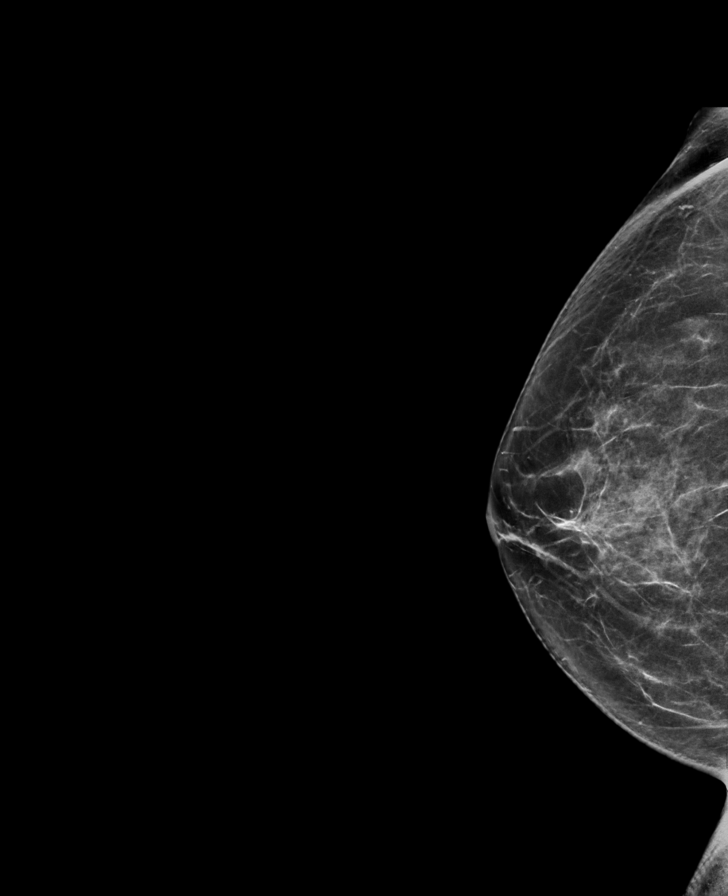

[L MLO synth-2D]
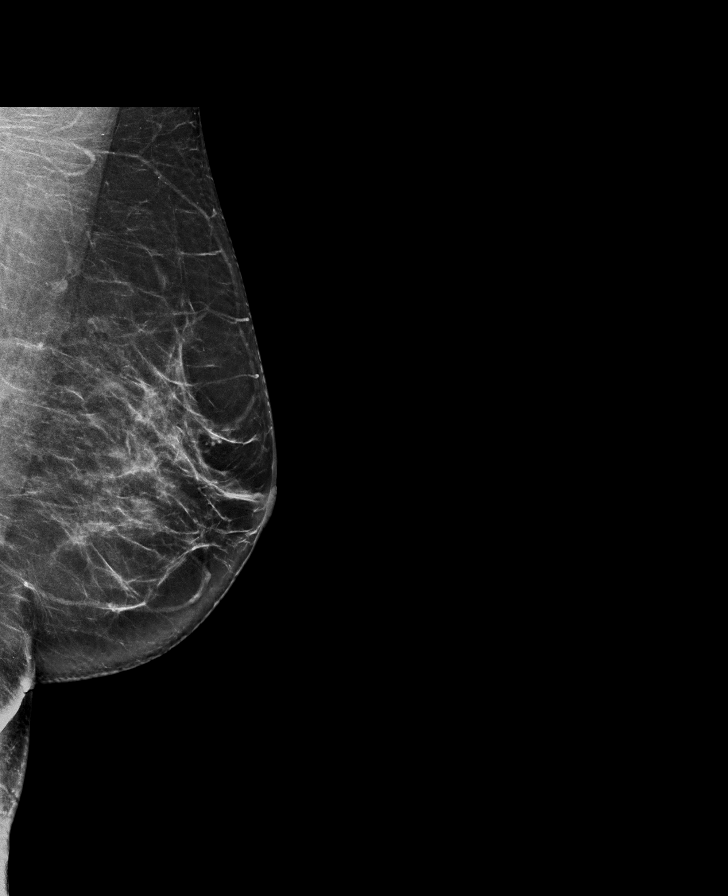

[L CC synth-2D]
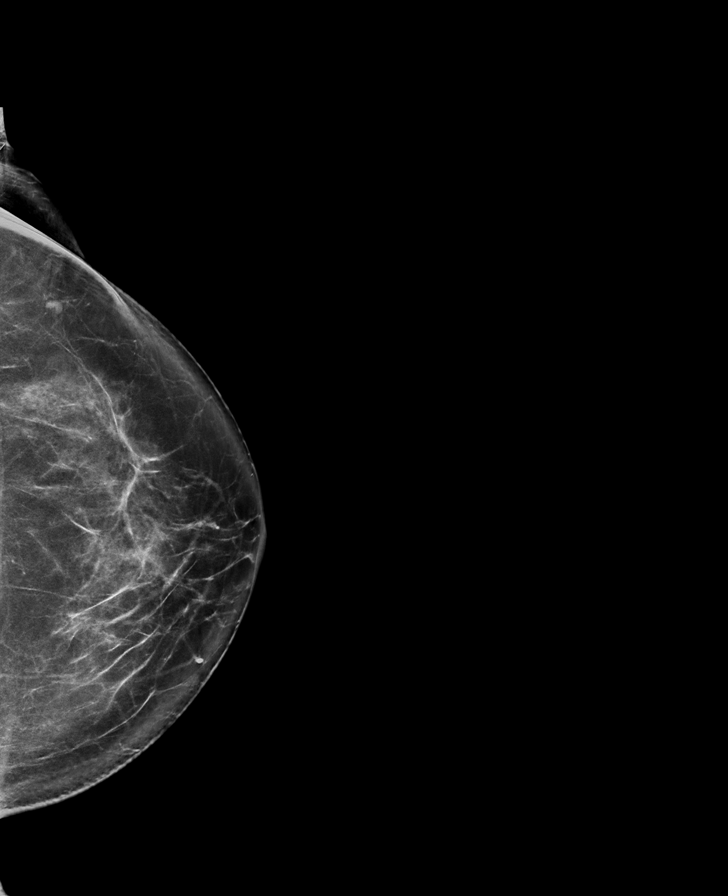

[R MLO tomo · tomo slice 35/70.0]
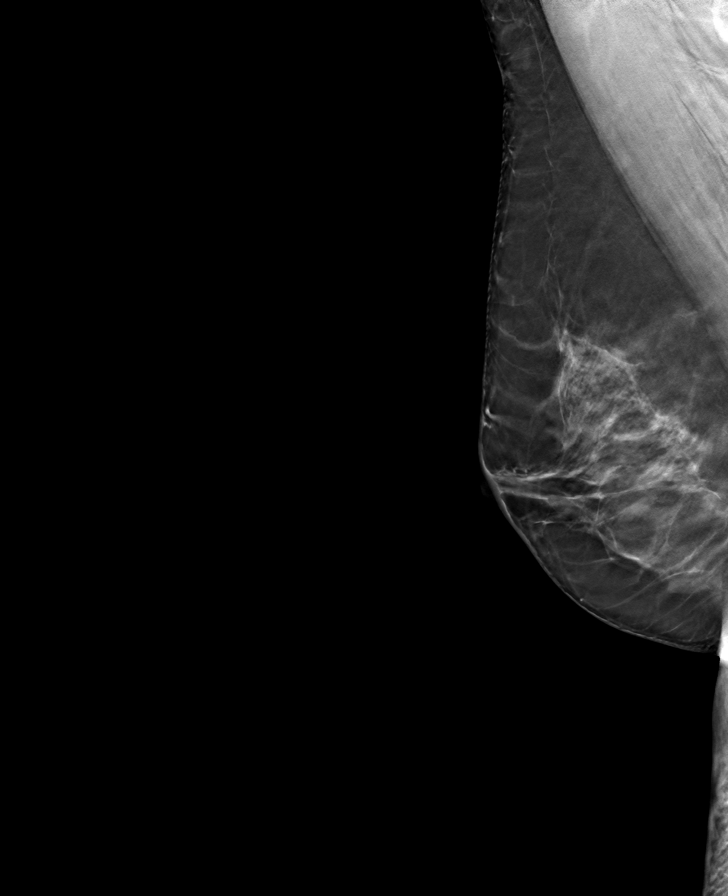

[R CC tomo · tomo slice 35/69.0]
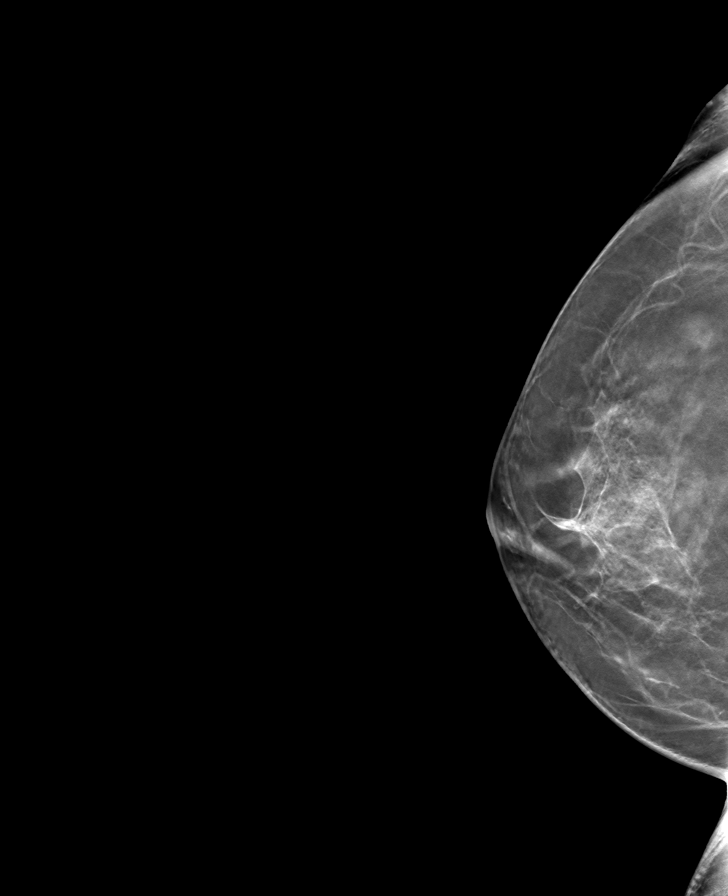

[L MLO tomo · tomo slice 37/72.0]
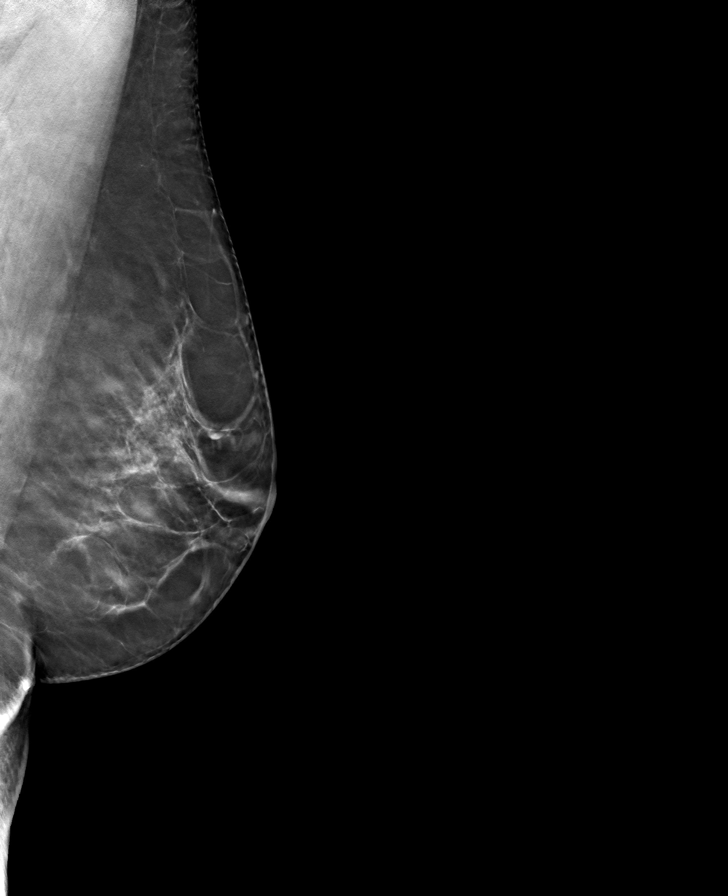

[L CC tomo · tomo slice 39/78.0]
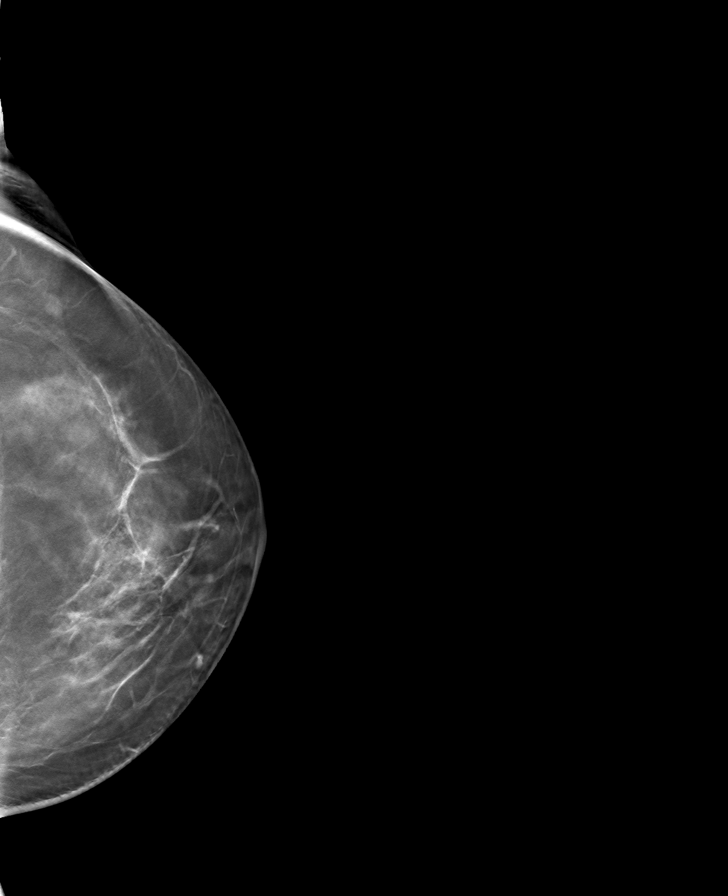

[8 of 24 positions shown; findings below may reference images not displayed]

ACR Breast Density Category b: There are scattered areas of
fibroglandular density.
FINDINGS: There are no findings suspicious for malignancy. Images were
processed with CAD.
IMPRESSION: No mammographic evidence of malignancy. A result letter of this
screening mammogram will be mailed directly to the patient.

RECOMMENDATION:
Screening mammogram in one year. (Code:[TQ])

BI-RADS CATEGORY  1: Negative.

## 2019-05-24 ENCOUNTER — Other Ambulatory Visit: Payer: Self-pay | Admitting: Family Medicine

## 2019-05-24 DIAGNOSIS — F329 Major depressive disorder, single episode, unspecified: Secondary | ICD-10-CM

## 2019-05-24 DIAGNOSIS — F32A Depression, unspecified: Secondary | ICD-10-CM

## 2019-05-24 DIAGNOSIS — F419 Anxiety disorder, unspecified: Secondary | ICD-10-CM

## 2019-05-24 NOTE — Telephone Encounter (Signed)
LOV 03/26/2018, last filled on 03/26/2018 for 1 year. No future appointments. Does patient need follow up or CPE appointment?

## 2019-06-09 ENCOUNTER — Other Ambulatory Visit: Payer: Self-pay

## 2019-06-09 ENCOUNTER — Encounter: Payer: Self-pay | Admitting: Family Medicine

## 2019-06-09 ENCOUNTER — Ambulatory Visit: Payer: BC Managed Care – PPO | Admitting: Family Medicine

## 2019-06-09 VITALS — BP 120/76 | HR 79 | Temp 98.2°F | Ht 63.0 in | Wt 186.1 lb

## 2019-06-09 DIAGNOSIS — H9192 Unspecified hearing loss, left ear: Secondary | ICD-10-CM | POA: Insufficient documentation

## 2019-06-09 MED ORDER — FLUTICASONE PROPIONATE 50 MCG/ACT NA SUSP: 2.0000 | Freq: Every day | NASAL | 1 refills | Status: DC

## 2019-06-09 NOTE — Assessment & Plan Note (Addendum)
In allergic rhinitis hx and lake swimming prior to onset of symptoms. Benign exam today. Anticipate ETD. Treat with flonase x 1-2 wks. If ongoing concern, will refer to ENT. Pt agrees with plan.

## 2019-06-09 NOTE — Progress Notes (Signed)
This visit was conducted in person.  BP 120/76 (BP Location: Left Arm, Patient Position: Sitting, Cuff Size: Normal)   Pulse 79   Temp 98.2 F (36.8 C) (Temporal)   Ht 5\' 3"  (1.6 m)   Wt 186 lb 1 oz (84.4 kg)   LMP 05/18/2019   SpO2 99%   BMI 32.96 kg/m    CC: L ear trouble Subjective:    Patient ID: Amy Marks, female    DOB: [birth year], 48 y.o.   MRN: [id number]  HPI: Amy Marks is a 48 y.o. female presenting on 06/09/2019 for Ear Problem (C/o feeling like water in left ear.  Occasional throbbing sound. Denies pain.  Started about 1 mo ago, worsening. )   1 mo h/o L ear muffled hearing. Today especially bad. No pain, drainage from ear, tinnitus. Occasional thumping feeling in ear that lasts seconds. Mild dizziness. No preceding viral illness. No nausea, vertigo. She does have year round allergies and can stay congested - takes allegra for this. 1 mo [place].      Relevant past medical, surgical, family and social history reviewed and updated as indicated. Interim medical history since our last visit reviewed. Allergies and medications reviewed and updated. Outpatient Medications Prior to Visit  Medication Sig Dispense Refill  . fexofenadine (ALLEGRA) 60 MG tablet Take 60 mg 2 (two) times daily by mouth.    Marland Kitchen ibuprofen (ADVIL,MOTRIN) 800 MG tablet Take 800 mg by mouth every 8 (eight) hours as needed for pain.    Marland Kitchen neomycin-polymyxin b-dexamethasone (MAXITROL) 3.5-10000-0.1 OINT   0  . Olopatadine HCl 0.2 % SOLN   6  . omeprazole (PRILOSEC) 20 MG capsule   0  . propranolol (INDERAL) 10 MG tablet 1 - 2 tablets 30 minutes before social situation 60 tablet 5  . sertraline (ZOLOFT) 100 MG tablet Take 1 tablet (100 mg total) by mouth daily. Needs office visit for additional refills. 30 tablet 0   No facility-administered medications prior to visit.      Per HPI unless specifically indicated in ROS section below Review of Systems Objective:     BP 120/76 (BP Location: Left Arm, Patient Position: Sitting, Cuff Size: Normal)   Pulse 79   Temp 98.2 F (36.8 C) (Temporal)   Ht 5\' 3"  (1.6 m)   Wt 186 lb 1 oz (84.4 kg)   LMP 05/18/2019   SpO2 99%   BMI 32.96 kg/m   Wt Readings from Last 3 Encounters:  06/09/19 186 lb 1 oz (84.4 kg)  09/24/18 193 lb (87.5 kg)  03/26/18 191 lb (86.6 kg)    Physical Exam Vitals signs and nursing note reviewed.  Constitutional:      General: She is not in acute distress.    Appearance: Normal appearance. She is not ill-appearing.  HENT:     Head: Normocephalic and atraumatic.     Right Ear: Tympanic membrane, ear canal and external ear normal. No decreased hearing noted. There is no impacted cerumen.     Left Ear: Tympanic membrane, ear canal and external ear normal. Decreased hearing noted. There is no impacted cerumen.     Nose: Nose normal. No congestion or rhinorrhea.     Mouth/Throat:     Mouth: Mucous membranes are moist.     Pharynx: No posterior oropharyngeal erythema.  Eyes:     Extraocular Movements: Extraocular movements intact.     Pupils: Pupils are equal, round, and reactive  to light.  Lymphadenopathy:     Cervical: No cervical adenopathy.  Neurological:     Mental Status: She is alert.  Psychiatric:        Mood and Affect: Mood normal.        Behavior: Behavior normal.       Assessment & Plan:   Problem List Items Addressed This Visit    Decreased hearing, left - Primary    In allergic rhinitis hx and lake swimming prior to onset of symptoms. Benign exam today. Anticipate ETD. Treat with flonase x 1-2 wks. If ongoing concern, will refer to ENT. Pt agrees with plan.           Meds ordered this encounter  Medications  . fluticasone (FLONASE) 50 MCG/ACT nasal spray    Sig: Place 2 sprays into both nostrils daily.    Dispense:  16 g    Refill:  1   No orders of the defined types were placed in this encounter.   Follow up plan: Return if symptoms worsen or fail  to improve.  Ria Bush, MD

## 2019-06-09 NOTE — Patient Instructions (Signed)
Ear overall looking ok. Possible eustachian tube dysfunction - treat with flonase sent to pharmacy for 1-2 weeks. If ongoing trouble, call us with update and we will refer you to ENT for further evaluation.   Eustachian Tube Dysfunction  Eustachian tube dysfunction refers to a condition in which a blockage develops in the narrow passage that connects the middle ear to the back of the nose (eustachian tube). The eustachian tube regulates air pressure in the middle ear by letting air move between the ear and nose. It also helps to drain fluid from the middle ear space. Eustachian tube dysfunction can affect one or both ears. When the eustachian tube does not function properly, air pressure, fluid, or both can build up in the middle ear. What are the causes? This condition occurs when the eustachian tube becomes blocked or cannot open normally. Common causes of this condition include:  Ear infections.  Colds and other infections that affect the nose, mouth, and throat (upper respiratory tract).  Allergies.  Irritation from cigarette smoke.  Irritation from stomach acid coming up into the esophagus (gastroesophageal reflux). The esophagus is the tube that carries food from the mouth to the stomach.  Sudden changes in air pressure, such as from descending in an airplane or scuba diving.  Abnormal growths in the nose or throat, such as: ? Growths that line the nose (nasal polyps). ? Abnormal growth of cells (tumors). ? Enlarged tissue at the back of the throat (adenoids). What increases the risk? You are more likely to develop this condition if:  You smoke.  You are overweight.  You are a child who has: ? Certain birth defects of the mouth, such as cleft palate. ? Large tonsils or adenoids. What are the signs or symptoms? Common symptoms of this condition include:  A feeling of fullness in the ear.  Ear pain.  Clicking or popping noises in the ear.  Ringing in the ear.   Hearing loss.  Loss of balance.  Dizziness. Symptoms may get worse when the air pressure around you changes, such as when you travel to an area of high elevation, fly on an airplane, or go scuba diving. How is this diagnosed? This condition may be diagnosed based on:  Your symptoms.  A physical exam of your ears, nose, and throat.  Tests, such as those that measure: ? The movement of your eardrum (tympanogram). ? Your hearing (audiometry). How is this treated? Treatment depends on the cause and severity of your condition.  In mild cases, you may relieve your symptoms by moving air into your ears. This is called "popping the ears."  In more severe cases, or if you have symptoms of fluid in your ears, treatment may include: ? Medicines to relieve congestion (decongestants). ? Medicines that treat allergies (antihistamines). ? Nasal sprays or ear drops that contain medicines that reduce swelling (steroids). ? A procedure to drain the fluid in your eardrum (myringotomy). In this procedure, a small tube is placed in the eardrum to:  Drain the fluid.  Restore the air in the middle ear space. ? A procedure to insert a balloon device through the nose to inflate the opening of the eustachian tube (balloon dilation). Follow these instructions at home: Lifestyle  Do not do any of the following until your health care provider approves: ? Travel to high altitudes. ? Fly in airplanes. ? Work in a Pension scheme manager or room. ? Scuba dive.  Do not use any products that contain nicotine or tobacco,  such as cigarettes and e-cigarettes. If you need help quitting, ask your health care provider.  Keep your ears dry. Wear fitted earplugs during showering and bathing. Dry your ears completely after. General instructions  Take over-the-counter and prescription medicines only as told by your health care provider.  Use techniques to help pop your ears as recommended by your health care provider.  These may include: ? Chewing gum. ? Yawning. ? Frequent, forceful swallowing. ? Closing your mouth, holding your nose closed, and gently blowing as if you are trying to blow air out of your nose.  Keep all follow-up visits as told by your health care provider. This is important. Contact a health care provider if:  Your symptoms do not go away after treatment.  Your symptoms come back after treatment.  You are unable to pop your ears.  You have: ? A fever. ? Pain in your ear. ? Pain in your head or neck. ? Fluid draining from your ear.  Your hearing suddenly changes.  You become very dizzy.  You lose your balance. Summary  Eustachian tube dysfunction refers to a condition in which a blockage develops in the eustachian tube.  It can be caused by ear infections, allergies, inhaled irritants, or abnormal growths in the nose or throat.  Symptoms include ear pain, hearing loss, or ringing in the ears.  Mild cases are treated with maneuvers to unblock the ears, such as yawning or ear popping.  Severe cases are treated with medicines. Surgery may also be done (rare). This information is not intended to replace advice given to you by your health care provider. Make sure you discuss any questions you have with your health care provider. Document Released: 11/02/2015 Document Revised: 01/26/2018 Document Reviewed: 01/26/2018 Elsevier Patient Education  2020 [employer].

## 2019-06-20 ENCOUNTER — Encounter: Payer: Self-pay | Admitting: Family Medicine

## 2019-06-20 ENCOUNTER — Ambulatory Visit (INDEPENDENT_AMBULATORY_CARE_PROVIDER_SITE_OTHER): Payer: BC Managed Care – PPO | Admitting: Family Medicine

## 2019-06-20 ENCOUNTER — Other Ambulatory Visit: Payer: Self-pay

## 2019-06-20 VITALS — BP 112/74 | HR 76 | Temp 98.2°F | Ht 63.0 in | Wt 184.8 lb

## 2019-06-20 DIAGNOSIS — Z13220 Encounter for screening for lipoid disorders: Secondary | ICD-10-CM

## 2019-06-20 DIAGNOSIS — Z87891 Personal history of nicotine dependence: Secondary | ICD-10-CM | POA: Insufficient documentation

## 2019-06-20 DIAGNOSIS — Z9189 Other specified personal risk factors, not elsewhere classified: Secondary | ICD-10-CM

## 2019-06-20 DIAGNOSIS — Z720 Tobacco use: Secondary | ICD-10-CM

## 2019-06-20 DIAGNOSIS — Z0000 Encounter for general adult medical examination without abnormal findings: Secondary | ICD-10-CM

## 2019-06-20 DIAGNOSIS — N946 Dysmenorrhea, unspecified: Secondary | ICD-10-CM

## 2019-06-20 DIAGNOSIS — F329 Major depressive disorder, single episode, unspecified: Secondary | ICD-10-CM

## 2019-06-20 DIAGNOSIS — Z1321 Encounter for screening for nutritional disorder: Secondary | ICD-10-CM

## 2019-06-20 DIAGNOSIS — F32A Depression, unspecified: Secondary | ICD-10-CM | POA: Insufficient documentation

## 2019-06-20 DIAGNOSIS — F419 Anxiety disorder, unspecified: Secondary | ICD-10-CM

## 2019-06-20 MED ORDER — SERTRALINE HCL 100 MG PO TABS: 100.0000 mg | ORAL_TABLET | Freq: Every day | ORAL | 3 refills | Status: DC

## 2019-06-20 NOTE — Progress Notes (Signed)
Subjective:    Patient ID: Amy Marks, female    DOB: 1971-04-29, 48 y.o.   MRN: [id number]  HPI This is a 48 yo female who presents today for CPE.   Last CPE- gyn 10/04/18 well woman Mammo- 12/27/2018 Pap- 06/27/2016- 5 yr recall Colonoscopy- 02/14/2013 Tdap- 06/27/2016 Flu- declines Eye- regular Dental- works at dental office Exercise- none  Increased depression. Sleeping more. Increased stress. Has follow up with psych. Has 65 yo son part time. Daughter at [employer], living in an apartment.   Past Medical History:  Diagnosis Date  . Anxiety   . Depression   . H. pylori infection 2018   treated with abx  . OCD (obsessive compulsive disorder)   . Seasonal allergies   . Social anxiety disorder 09/12/2013   Past Surgical History:  Procedure Laterality Date  . DILITATION & CURRETTAGE/HYSTROSCOPY WITH NOVASURE ABLATION  09/03/2012   Procedure: DILATATION & CURETTAGE/HYSTEROSCOPY WITH NOVASURE ABLATION;  Surgeon: Daria Pastures, MD;  Location: [facility];  Service: Gynecology;  Laterality: N/A;  . LAPAROSCOPIC TUBAL LIGATION  09/03/2012   Procedure: LAPAROSCOPIC TUBAL LIGATION;  Surgeon: Daria Pastures, MD;  Location: [facility];  Service: Gynecology;  Laterality: Bilateral;  FILSHIE CLIPS  . LAPAROSCOPY  10/14/2012   Procedure: LAPAROSCOPY OPERATIVE with LSO and LOA;  Surgeon: Farrel Gobble. Harrington Challenger, MD;  Location: [facility];  Service: Gynecology;  Laterality: N/A;  . LAPAROTOMY  10/14/2012   Procedure: EXPLORATORY LAPAROTOMY;  Surgeon: Farrel Gobble. Harrington Challenger, MD;  Location: [facility];  Service: Gynecology;  Laterality: N/A;  . NASAL SEPTUM SURGERY    . OOPHORECTOMY  10/14/2012   Left ovary removed--hx "ruptured chocolate cyst"  . TONSILLECTOMY     Family History  Problem Relation Age of Onset  . Depression Mother   . Fibromyalgia Mother   . Breast cancer Mother 57  . Rheum arthritis Mother   . Alcohol abuse Father   . Emphysema Maternal Grandmother        Dec COPD  . Breast cancer Maternal  Grandmother 76  . Multiple sclerosis Maternal Grandfather   . Breast cancer Sister 3  . Breast cancer Paternal Aunt 56       A & W   Social History   Tobacco Use  . Smoking status: Current Every Day Smoker    Packs/day: 0.50    Types: Cigarettes  . Smokeless tobacco: Never Used  . Tobacco comment: started smoking again 2015  Substance Use Topics  . Alcohol use: Yes    Comment: rarely--4-5 drinks/year  . Drug use: No      Review of Systems  Constitutional: Positive for fatigue.  HENT: Negative.   Eyes: Negative.   Respiratory: Negative.   Cardiovascular: Positive for palpitations (rare, when stressed increased, last less than 5 minutes. Resolve spontaneously. ).  Gastrointestinal: Negative.   Genitourinary: Positive for menstrual problem (heavy and bad cramps first day).  Musculoskeletal: Negative.   Skin: Negative.   Allergic/Immunologic: Negative.   Neurological: Negative.   Hematological: Negative.   Psychiatric/Behavioral: Positive for dysphoric mood and sleep disturbance (increased).       Objective:   Physical Exam Vitals signs reviewed.  Constitutional:      General: She is not in acute distress.    Appearance: Normal appearance. She is obese. She is not ill-appearing, toxic-appearing or diaphoretic.  HENT:     Head: Normocephalic and atraumatic.  Eyes:     Conjunctiva/sclera: Conjunctivae normal.  Neck:     Musculoskeletal: Normal  range of motion. No neck rigidity or muscular tenderness.  Cardiovascular:     Rate and Rhythm: Normal rate and regular rhythm.     Pulses: Normal pulses.     Heart sounds: Normal heart sounds.  Pulmonary:     Effort: Pulmonary effort is normal.     Breath sounds: Normal breath sounds.  Chest:     Breasts: Breasts are symmetrical.        Right: Normal.        Left: Normal.  Abdominal:     General: Abdomen is flat. Bowel sounds are normal. There is no distension.     Palpations: Abdomen is soft. There is no mass.      Tenderness: There is no abdominal tenderness. There is no guarding or rebound.     Hernia: No hernia is present.  Musculoskeletal:     Right lower leg: No edema.     Left lower leg: No edema.  Lymphadenopathy:     Cervical: No cervical adenopathy.     Upper Body:     Right upper body: No supraclavicular, axillary or pectoral adenopathy.     Left upper body: No supraclavicular, axillary or pectoral adenopathy.  Skin:    General: Skin is warm and dry.  Neurological:     Mental Status: She is alert and oriented to person, place, and time.       BP 112/74 (BP Location: Left Arm, Patient Position: Sitting, Cuff Size: Normal)   Pulse 76   Temp 98.2 F (36.8 C) (Temporal)   Ht 5\' 3"  (1.6 m)   Wt 184 lb 12.8 oz (83.8 kg)   SpO2 97%   BMI 32.74 kg/m  Wt Readings from Last 3 Encounters:  06/20/19 184 lb 12.8 oz (83.8 kg)  06/09/19 186 lb 1 oz (84.4 kg)  09/24/18 193 lb (87.5 kg)   Depression screen PHQ 2/9 06/20/2019  Decreased Interest 1  Down, Depressed, Hopeless 1  PHQ - 2 Score 2  Altered sleeping 1  Tired, decreased energy 2  Change in appetite 2  Feeling bad or failure about yourself  1  Trouble concentrating 0  Moving slowly or fidgety/restless 0  Suicidal thoughts 0  PHQ-9 Score 8  Difficult doing work/chores Not difficult at all       Assessment & Plan:  1. Annual physical exam - Discussed and encouraged healthy lifestyle choices- adequate sleep, regular exercise, stress management and healthy food choices.   2. Dysmenorrhea - certainly could be entering into perimenopause, provided information, anticipatory guidance - TSH - CBC with Differential - Ferritin  3. Screening for lipid disorders - Lipid Panel  4. Encounter for vitamin deficiency screening - Vitamin D, 25-hydroxy  5. Anxiety and depression - follow up with psych as scheduled, continue current medication - discussed non pharmacologic means of dealing with depression and encouraged her to walk  daily - sertraline (ZOLOFT) 100 MG tablet; Take 1 tablet (100 mg total) by mouth daily.  Dispense: 90 tablet; Refill: 3  6. Increased risk of breast cancer - discussed importance of monthly self breast exams and yearly mammogram  7. Tobacco abuse - discussed smoking cessation and encouraged her to consider nicotine replacement, weaning, provided written tips for success.    Clarene Reamer, FNP-BC  [facility], [facility]  06/20/2019 5:40 PM

## 2019-06-20 NOTE — Patient Instructions (Signed)
Good to see you today  I suggest walking for 30 minutes every day- this is a stroll for relaxation, find some stress relieving activities  Follow up in 1 year for complete physical exam, sooner if any issues   Perimenopause  Perimenopause is the normal time of life before and after menstrual periods stop completely (menopause). Perimenopause can begin 2-8 years before menopause, and it usually lasts for 1 year after menopause. During perimenopause, the ovaries may or may not produce an egg. What are the causes? This condition is caused by a natural change in hormone levels that happens as you get older. What increases the risk? This condition is more likely to start at an earlier age if you have certain medical conditions or treatments, including:  A tumor of the pituitary gland in the brain.  A disease that affects the ovaries and hormone production.  Radiation treatment for cancer.  Certain cancer treatments, such as chemotherapy or hormone (anti-estrogen) therapy.  Heavy smoking and excessive alcohol use.  Family history of early menopause. What are the signs or symptoms? Perimenopausal changes affect each woman differently. Symptoms of this condition may include:  Hot flashes.  Night sweats.  Irregular menstrual periods.  Decreased sex drive.  Vaginal dryness.  Headaches.  Mood swings.  Depression.  Memory problems or trouble concentrating.  Irritability.  Tiredness.  Weight gain.  Anxiety.  Trouble getting pregnant. How is this diagnosed? This condition is diagnosed based on your medical history, a physical exam, your age, your menstrual history, and your symptoms. Hormone tests may also be done. How is this treated? In some cases, no treatment is needed. You and your health care provider should make a decision together about whether treatment is necessary. Treatment will be based on your individual condition and preferences. Various treatments are  available, such as:  Menopausal hormone therapy (MHT).  Medicines to treat specific symptoms.  Acupuncture.  Vitamin or herbal supplements. Before starting treatment, make sure to let your health care provider know if you have a personal or family history of:  Heart disease.  Breast cancer.  Blood clots.  Diabetes.  Osteoporosis. Follow these instructions at home: Lifestyle  Do not use any products that contain nicotine or tobacco, such as cigarettes and e-cigarettes. If you need help quitting, ask your health care provider.  Eat a balanced diet that includes fresh fruits and vegetables, whole grains, soybeans, eggs, lean meat, and low-fat dairy.  Get at least 30 minutes of physical activity on 5 or more days each week.  Avoid alcoholic and caffeinated beverages, as well as spicy foods. This may help prevent hot flashes.  Get 7-8 hours of sleep each night.  Dress in layers that can be removed to help you manage hot flashes.  Find ways to manage stress, such as deep breathing, meditation, or journaling. General instructions  Keep track of your menstrual periods, including: ? When they occur. ? How heavy they are and how long they last. ? How much time passes between periods.  Keep track of your symptoms, noting when they start, how often you have them, and how long they last.  Take over-the-counter and prescription medicines only as told by your health care provider.  Take vitamin supplements only as told by your health care provider. These may include calcium, vitamin E, and vitamin D.  Use vaginal lubricants or moisturizers to help with vaginal dryness and improve comfort during sex.  Talk with your health care provider before starting any herbal supplements.  Keep all follow-up visits as told by your health care provider. This is important. This includes any group therapy or counseling. Contact a health care provider if:  You have heavy vaginal bleeding or  pass blood clots.  Your period lasts more than 2 days longer than normal.  Your periods are recurring sooner than 21 days.  You bleed after having sex. Get help right away if:  You have chest pain, trouble breathing, or trouble talking.  You have severe depression.  You have pain when you urinate.  You have severe headaches.  You have vision problems. Summary  Perimenopause is the time when a woman's body begins to move into menopause. This may happen naturally or as a result of other health problems or medical treatments.  Perimenopause can begin 2-8 years before menopause, and it usually lasts for 1 year after menopause.  Perimenopausal symptoms can be managed through medicines, lifestyle changes, and complementary therapies such as acupuncture. This information is not intended to replace advice given to you by your health care provider. Make sure you discuss any questions you have with your health care provider. Document Released: 11/13/2004 Document Revised: 09/18/2017 Document Reviewed: 11/11/2016 Elsevier Patient Education  2020 [place] Maintenance, Female Adopting a healthy lifestyle and getting preventive care are important in promoting health and wellness. Ask your health care provider about:  The right schedule for you to have regular tests and exams.  Things you can do on your own to prevent diseases and keep yourself healthy. What should I know about diet, weight, and exercise? Eat a healthy diet   Eat a diet that includes plenty of vegetables, fruits, low-fat dairy products, and lean protein.  Do not eat a lot of foods that are high in solid fats, added sugars, or sodium. Maintain a healthy weight Body mass index (BMI) is used to identify weight problems. It estimates body fat based on height and weight. Your health care provider can help determine your BMI and help you achieve or maintain a healthy weight. Get regular exercise Get regular  exercise. This is one of the most important things you can do for your health. Most adults should:  Exercise for at least 150 minutes each week. The exercise should increase your heart rate and make you sweat (moderate-intensity exercise).  Do strengthening exercises at least twice a week. This is in addition to the moderate-intensity exercise.  Spend less time sitting. Even light physical activity can be beneficial. Watch cholesterol and blood lipids Have your blood tested for lipids and cholesterol at 47 years of age, then have this test every 5 years. Have your cholesterol levels checked more often if:  Your lipid or cholesterol levels are high.  You are older than 47 years of age.  You are at high risk for heart disease. What should I know about cancer screening? Depending on your health history and family history, you may need to have cancer screening at various ages. This may include screening for:  Breast cancer.  Cervical cancer.  Colorectal cancer.  Skin cancer.  Lung cancer. What should I know about heart disease, diabetes, and high blood pressure? Blood pressure and heart disease  High blood pressure causes heart disease and increases the risk of stroke. This is more likely to develop in people who have high blood pressure readings, are of African descent, or are overweight.  Have your blood pressure checked: ? Every 3-5 years if you are 41-29 years of age. ?  Every year if you are 84 years old or older. Diabetes Have regular diabetes screenings. This checks your fasting blood sugar level. Have the screening done:  Once every three years after age 35 if you are at a normal weight and have a low risk for diabetes.  More often and at a younger age if you are overweight or have a high risk for diabetes. What should I know about preventing infection? Hepatitis B If you have a higher risk for hepatitis B, you should be screened for this virus. Talk with your health  care provider to find out if you are at risk for hepatitis B infection. Hepatitis C Testing is recommended for:  Everyone born from [birth year] through 1964.  Anyone with known risk factors for hepatitis C. Sexually transmitted infections (STIs)  Get screened for STIs, including gonorrhea and chlamydia, if: ? You are sexually active and are younger than 47 years of age. ? You are older than 47 years of age and your health care provider tells you that you are at risk for this type of infection. ? Your sexual activity has changed since you were last screened, and you are at increased risk for chlamydia or gonorrhea. Ask your health care provider if you are at risk.  Ask your health care provider about whether you are at high risk for HIV. Your health care provider may recommend a prescription medicine to help prevent HIV infection. If you choose to take medicine to prevent HIV, you should first get tested for HIV. You should then be tested every 3 months for as long as you are taking the medicine. Pregnancy  If you are about to stop having your period (premenopausal) and you may become pregnant, seek counseling before you get pregnant.  Take 400 to 800 micrograms (mcg) of folic acid every day if you become pregnant.  Ask for birth control (contraception) if you want to prevent pregnancy. Osteoporosis and menopause Osteoporosis is a disease in which the bones lose minerals and strength with aging. This can result in bone fractures. If you are 61 years old or older, or if you are at risk for osteoporosis and fractures, ask your health care provider if you should:  Be screened for bone loss.  Take a calcium or vitamin D supplement to lower your risk of fractures.  Be given hormone replacement therapy (HRT) to treat symptoms of menopause. Follow these instructions at home: Lifestyle  Do not use any products that contain nicotine or tobacco, such as cigarettes, e-cigarettes, and chewing tobacco.  If you need help quitting, ask your health care provider.  Do not use street drugs.  Do not share needles.  Ask your health care provider for help if you need support or information about quitting drugs. Alcohol use  Do not drink alcohol if: ? Your health care provider tells you not to drink. ? You are pregnant, may be pregnant, or are planning to become pregnant.  If you drink alcohol: ? Limit how much you use to 0-1 drink a day. ? Limit intake if you are breastfeeding.  Be aware of how much alcohol is in your drink. In the [place] one drink equals one 12 oz bottle of beer (355 mL), one 5 oz glass of wine (148 mL), or one 1 oz glass of hard liquor (44 mL). General instructions  Schedule regular health, dental, and eye exams.  Stay current with your vaccines.  Tell your health care provider if: ? You often feel depressed. ?  You have ever been abused or do not feel safe at home. Summary  Adopting a healthy lifestyle and getting preventive care are important in promoting health and wellness.  Follow your health care provider's instructions about healthy diet, exercising, and getting tested or screened for diseases.  Follow your health care provider's instructions on monitoring your cholesterol and blood pressure. This information is not intended to replace advice given to you by your health care provider. Make sure you discuss any questions you have with your health care provider. Document Released: 04/21/2011 Document Revised: 09/29/2018 Document Reviewed: 09/29/2018 Elsevier Patient Education  2020 [place] with Quitting Smoking  Quitting smoking is a physical and mental challenge. You will face cravings, withdrawal symptoms, and temptation. Before quitting, work with your health care provider to make a plan that can help you cope. Preparation can help you quit and keep you from giving in. How can I cope with cravings? Cravings usually last for 5-10 minutes. If  you get through it, the craving will pass. Consider taking the following actions to help you cope with cravings:  Keep your mouth busy: ? Chew sugar-free gum. ? Suck on hard candies or a straw. ? Brush your teeth.  Keep your hands and body busy: ? Immediately change to a different activity when you feel a craving. ? Squeeze or play with a ball. ? Do an activity or a hobby, like making bead jewelry, practicing needlepoint, or working with wood. ? Mix up your normal routine. ? Take a short exercise break. Go for a quick walk or run up and down stairs. ? Spend time in public places where smoking is not allowed.  Focus on doing something kind or helpful for someone else.  Call a friend or family member to talk during a craving.  Join a support group.  Call a quit line, such as 1-800-QUIT-NOW.  Talk with your health care provider about medicines that might help you cope with cravings and make quitting easier for you. How can I deal with withdrawal symptoms? Your body may experience negative effects as it tries to get used to not having nicotine in the system. These effects are called withdrawal symptoms. They may include:  Feeling hungrier than normal.  Trouble concentrating.  Irritability.  Trouble sleeping.  Feeling depressed.  Restlessness and agitation.  Craving a cigarette. To manage withdrawal symptoms:  Avoid places, people, and activities that trigger your cravings.  Remember why you want to quit.  Get plenty of sleep.  Avoid coffee and other caffeinated drinks. These may worsen some of your symptoms. How can I handle social situations? Social situations can be difficult when you are quitting smoking, especially in the first few weeks. To manage this, you can:  Avoid parties, bars, and other social situations where people might be smoking.  Avoid alcohol.  Leave right away if you have the urge to smoke.  Explain to your family and friends that you are  quitting smoking. Ask for understanding and support.  Plan activities with friends or family where smoking is not an option. What are some ways I can cope with stress? Wanting to smoke may cause stress, and stress can make you want to smoke. Find ways to manage your stress. Relaxation techniques can help. For example:  Breathe slowly and deeply, in through your nose and out through your mouth.  Listen to soothing, relaxing music.  Talk with a family member or friend about your stress.  Light a candle.  Soak in a bath or take a shower.  Think about a peaceful place. What are some ways I can prevent weight gain? Be aware that many people gain weight after they quit smoking. However, not everyone does. To keep from gaining weight, have a plan in place before you quit and stick to the plan after you quit. Your plan should include:  Having healthy snacks. When you have a craving, it may help to: ? Eat plain popcorn, crunchy carrots, celery, or other cut vegetables. ? Chew sugar-free gum.  Changing how you eat: ? Eat small portion sizes at meals. ? Eat 4-6 small meals throughout the day instead of 1-2 large meals a day. ? Be mindful when you eat. Do not watch television or do other things that might distract you as you eat.  Exercising regularly: ? Make time to exercise each day. If you do not have time for a long workout, do short bouts of exercise for 5-10 minutes several times a day. ? Do some form of strengthening exercise, like weight lifting, and some form of aerobic exercise, like running or swimming.  Drinking plenty of water or other low-calorie or no-calorie drinks. Drink 6-8 glasses of water daily, or as much as instructed by your health care provider. Summary  Quitting smoking is a physical and mental challenge. You will face cravings, withdrawal symptoms, and temptation to smoke again. Preparation can help you as you go through these challenges.  You can cope with  cravings by keeping your mouth busy (such as by chewing gum), keeping your body and hands busy, and making calls to family, friends, or a helpline for people who want to quit smoking.  You can cope with withdrawal symptoms by avoiding places where people smoke, avoiding drinks with caffeine, and getting plenty of rest.  Ask your health care provider about the different ways to prevent weight gain, avoid stress, and handle social situations. This information is not intended to replace advice given to you by your health care provider. Make sure you discuss any questions you have with your health care provider. Document Released: 10/03/2016 Document Revised: 09/18/2017 Document Reviewed: 10/03/2016 Elsevier Patient Education  2020 [employer].

## 2019-06-21 LAB — CBC WITH DIFFERENTIAL/PLATELET
Basophils Absolute: 0.1 10*3/uL (ref 0.0–0.1)
Basophils Relative: 1.1 % (ref 0.0–3.0)
Eosinophils Absolute: 0.1 10*3/uL (ref 0.0–0.7)
Eosinophils Relative: 1.5 % (ref 0.0–5.0)
HCT: 43 % (ref 36.0–46.0)
Hemoglobin: 14.7 g/dL (ref 12.0–15.0)
Lymphocytes Relative: 25.5 % (ref 12.0–46.0)
Lymphs Abs: 2.2 10*3/uL (ref 0.7–4.0)
MCHC: 34.2 g/dL (ref 30.0–36.0)
MCV: 92.9 fl (ref 78.0–100.0)
Monocytes Absolute: 0.4 10*3/uL (ref 0.1–1.0)
Monocytes Relative: 4.8 % (ref 3.0–12.0)
Neutro Abs: 5.7 10*3/uL (ref 1.4–7.7)
Neutrophils Relative %: 67.1 % (ref 43.0–77.0)
Platelets: 207 10*3/uL (ref 150.0–400.0)
RBC: 4.63 Mil/uL (ref 3.87–5.11)
RDW: 12.4 % (ref 11.5–15.5)
WBC: 8.5 10*3/uL (ref 4.0–10.5)

## 2019-06-21 LAB — LIPID PANEL
Cholesterol: 187 mg/dL (ref 0–200)
HDL: 58 mg/dL (ref >39.00)
LDL Cholesterol: 116 mg/dL — ABNORMAL HIGH (ref 0–99)
NonHDL: 128.7
Total CHOL/HDL Ratio: 3
Triglycerides: 64 mg/dL (ref 0.0–149.0)
VLDL: 12.8 mg/dL (ref 0.0–40.0)

## 2019-06-21 LAB — VITAMIN D 25 HYDROXY (VIT D DEFICIENCY, FRACTURES): VITD: 32.02 ng/mL (ref 30.00–100.00)

## 2019-06-21 LAB — FERRITIN: Ferritin: 47.7 ng/mL (ref 10.0–291.0)

## 2019-06-21 LAB — TSH: TSH: 2.26 u[IU]/mL (ref 0.35–4.50)

## 2019-06-24 DIAGNOSIS — F4320 Adjustment disorder, unspecified: Secondary | ICD-10-CM | POA: Diagnosis not present

## 2019-07-01 DIAGNOSIS — F4320 Adjustment disorder, unspecified: Secondary | ICD-10-CM | POA: Diagnosis not present

## 2019-07-08 DIAGNOSIS — F4320 Adjustment disorder, unspecified: Secondary | ICD-10-CM | POA: Diagnosis not present

## 2019-07-14 ENCOUNTER — Telehealth: Payer: Self-pay | Admitting: Obstetrics and Gynecology

## 2019-07-14 NOTE — Telephone Encounter (Signed)
Left message on voicemail to call and reschedule cancelled appointment. °

## 2019-07-22 DIAGNOSIS — F4320 Adjustment disorder, unspecified: Secondary | ICD-10-CM | POA: Diagnosis not present

## 2019-07-29 DIAGNOSIS — F4320 Adjustment disorder, unspecified: Secondary | ICD-10-CM | POA: Diagnosis not present

## 2019-08-05 DIAGNOSIS — F4320 Adjustment disorder, unspecified: Secondary | ICD-10-CM | POA: Diagnosis not present

## 2019-08-12 DIAGNOSIS — F4320 Adjustment disorder, unspecified: Secondary | ICD-10-CM | POA: Diagnosis not present

## 2019-08-26 DIAGNOSIS — F4320 Adjustment disorder, unspecified: Secondary | ICD-10-CM | POA: Diagnosis not present

## 2019-09-09 DIAGNOSIS — F4320 Adjustment disorder, unspecified: Secondary | ICD-10-CM | POA: Diagnosis not present

## 2019-09-30 ENCOUNTER — Ambulatory Visit: Payer: BLUE CROSS/BLUE SHIELD | Admitting: Obstetrics and Gynecology

## 2019-09-30 DIAGNOSIS — F4320 Adjustment disorder, unspecified: Secondary | ICD-10-CM | POA: Diagnosis not present

## 2019-10-07 DIAGNOSIS — F4320 Adjustment disorder, unspecified: Secondary | ICD-10-CM | POA: Diagnosis not present

## 2019-10-20 DIAGNOSIS — F4320 Adjustment disorder, unspecified: Secondary | ICD-10-CM | POA: Diagnosis not present

## 2019-10-26 ENCOUNTER — Other Ambulatory Visit: Payer: Self-pay

## 2019-10-26 ENCOUNTER — Encounter: Payer: Self-pay | Admitting: Family Medicine

## 2019-10-26 ENCOUNTER — Ambulatory Visit: Payer: BC Managed Care – PPO | Admitting: Family Medicine

## 2019-10-26 VITALS — BP 102/64 | HR 89 | Temp 98.0°F | Ht 63.0 in | Wt 189.4 lb

## 2019-10-26 DIAGNOSIS — R1013 Epigastric pain: Secondary | ICD-10-CM | POA: Diagnosis not present

## 2019-10-26 DIAGNOSIS — A048 Other specified bacterial intestinal infections: Secondary | ICD-10-CM

## 2019-10-26 DIAGNOSIS — R21 Rash and other nonspecific skin eruption: Secondary | ICD-10-CM | POA: Diagnosis not present

## 2019-10-26 NOTE — Progress Notes (Signed)
Subjective:    Patient ID: Amy Marks, female    DOB: August 07, 1971, 49 y.o.   MRN: [id number]  HPI Chief Complaint  Patient presents with  . Abdominal Pain    Pain at base of sternum x 3 days. Denies N/V/D or constipation. No fever.   . Rash    Lower back x 2-3 weeks. Pt states that the rash is itchy.    This is a 49 yo female who presents today for above concerns.  She is having upper epigastric pain, bloating, pain. Eating makes it worse and worse at night. Constant pain. No nausea. Feels like prior h. Pylori. Felt worse after eating, no relief with tums/ pepto. Chronic loose stools, 3-4 days.  Rash on back, similar to one she had previously with h. Pylori. Saw derm for it, had biopsy, doesn't recall results. Had resolution after h. Pylori treated.   Past Medical History:  Diagnosis Date  . Anxiety   . Depression   . H. pylori infection 2018   treated with abx  . OCD (obsessive compulsive disorder)   . Seasonal allergies   . Social anxiety disorder 09/12/2013   Past Surgical History:  Procedure Laterality Date  . DILITATION & CURRETTAGE/HYSTROSCOPY WITH NOVASURE ABLATION  09/03/2012   Procedure: DILATATION & CURETTAGE/HYSTEROSCOPY WITH NOVASURE ABLATION;  Surgeon: Daria Pastures, MD;  Location: [facility];  Service: Gynecology;  Laterality: N/A;  . LAPAROSCOPIC TUBAL LIGATION  09/03/2012   Procedure: LAPAROSCOPIC TUBAL LIGATION;  Surgeon: Daria Pastures, MD;  Location: [facility];  Service: Gynecology;  Laterality: Bilateral;  FILSHIE CLIPS  . LAPAROSCOPY  10/14/2012   Procedure: LAPAROSCOPY OPERATIVE with LSO and LOA;  Surgeon: Farrel Gobble. Harrington Challenger, MD;  Location: [facility];  Service: Gynecology;  Laterality: N/A;  . LAPAROTOMY  10/14/2012   Procedure: EXPLORATORY LAPAROTOMY;  Surgeon: Farrel Gobble. Harrington Challenger, MD;  Location: [facility];  Service: Gynecology;  Laterality: N/A;  . NASAL SEPTUM SURGERY    . OOPHORECTOMY  10/14/2012   Left ovary removed--hx "ruptured chocolate cyst"  .  TONSILLECTOMY     Family History  Problem Relation Age of Onset  . Depression Mother   . Fibromyalgia Mother   . Breast cancer Mother 58  . Rheum arthritis Mother   . Alcohol abuse Father   . Emphysema Maternal Grandmother        Dec COPD  . Breast cancer Maternal Grandmother 14  . Multiple sclerosis Maternal Grandfather   . Breast cancer Sister 81  . Breast cancer Paternal Aunt 57       A & W   Social History   Tobacco Use  . Smoking status: Current Every Day Smoker    Packs/day: 0.50    Types: Cigarettes  . Smokeless tobacco: Never Used  . Tobacco comment: started smoking again 2015  Substance Use Topics  . Alcohol use: Yes    Comment: rarely--4-5 drinks/year  . Drug use: No      Review of Systems Per HPI    Objective:   Physical Exam Vitals reviewed.  Constitutional:      General: She is not in acute distress.    Appearance: She is well-developed. She is obese. She is not ill-appearing, toxic-appearing or diaphoretic.  HENT:     Head: Normocephalic and atraumatic.  Cardiovascular:     Rate and Rhythm: Normal rate and regular rhythm.     Heart sounds: Normal heart sounds.  Pulmonary:     Effort: Pulmonary effort is normal.  Breath sounds: Normal breath sounds.  Abdominal:     General: Abdomen is flat. Bowel sounds are normal. There is no distension. There are no signs of injury.     Palpations: Abdomen is soft. There is no mass.     Tenderness: There is abdominal tenderness (epigastric). There is no guarding or rebound.     Hernia: No hernia is present.  Skin:    General: Skin is warm and dry.     Findings: Rash (scattered, erythematous, papular rash lower back. ) present.  Neurological:     Mental Status: She is alert and oriented to person, place, and time.  Psychiatric:        Mood and Affect: Mood normal.        Behavior: Behavior normal.        Thought Content: Thought content normal.        Judgment: Judgment normal.       BP 102/64 (BP  Location: Left Arm, Patient Position: Sitting, Cuff Size: Normal)   Pulse 89   Temp 98 F (36.7 C) (Temporal)   Ht 5\' 3"  (1.6 m)   Wt 189 lb 6.4 oz (85.9 kg)   SpO2 98%   BMI 33.55 kg/m  Wt Readings from Last 3 Encounters:  10/26/19 189 lb 6.4 oz (85.9 kg)  06/20/19 184 lb 12.8 oz (83.8 kg)  06/09/19 186 lb 1 oz (84.4 kg)       Assessment & Plan:  1. Epigastric pain - once she collects stool sample, can start otc omeprazole daily - Helicobacter pylori special antigen; Future  2. Rash - can use otc hydrocortisone qd-bid for up to 10 days - RTC precautions reviewed  This visit occurred during the SARS-CoV-2 public health emergency.  Safety protocols were in place, including screening questions prior to the visit, additional usage of staff PPE, and extensive cleaning of exam room while observing appropriate contact time as indicated for disinfecting solutions.      Clarene Reamer, FNP-BC  [facility]  10/26/2019 11:00 AM

## 2019-10-26 NOTE — Patient Instructions (Signed)
Go to lab for stool collection container  Can use over the counter hydrocortisone on your rash- 1-2 times a day for up to 10 days

## 2019-10-27 ENCOUNTER — Encounter: Payer: Self-pay | Admitting: Family Medicine

## 2019-10-27 DIAGNOSIS — R1013 Epigastric pain: Secondary | ICD-10-CM | POA: Diagnosis not present

## 2019-10-27 DIAGNOSIS — A048 Other specified bacterial intestinal infections: Secondary | ICD-10-CM

## 2019-10-27 NOTE — Addendum Note (Signed)
Addended by: Ellamae Sia on: 10/27/2019 07:40 AM   Modules accepted: Orders

## 2019-10-28 DIAGNOSIS — F4320 Adjustment disorder, unspecified: Secondary | ICD-10-CM | POA: Diagnosis not present

## 2019-10-28 LAB — HELICOBACTER PYLORI  SPECIAL ANTIGEN
MICRO NUMBER:: 10018100
RESULT:: DETECTED — AB
SPECIMEN QUALITY: ADEQUATE

## 2019-10-28 MED ORDER — AMOXICILLIN 500 MG PO CAPS: 1000.0000 mg | ORAL_CAPSULE | Freq: Two times a day (BID) | ORAL | 0 refills | Status: DC

## 2019-10-28 MED ORDER — CLARITHROMYCIN 500 MG PO TABS: 500.0000 mg | ORAL_TABLET | Freq: Two times a day (BID) | ORAL | 0 refills | Status: DC

## 2019-10-28 MED ORDER — OMEPRAZOLE 20 MG PO CPDR: 20.0000 mg | DELAYED_RELEASE_CAPSULE | Freq: Two times a day (BID) | ORAL | 1 refills | Status: DC

## 2019-10-28 NOTE — Addendum Note (Signed)
Addended by: Clarene Reamer B on: 10/28/2019 04:36 PM   Modules accepted: Orders

## 2019-10-29 MED ORDER — METRONIDAZOLE 250 MG PO TABS: 250.0000 mg | ORAL_TABLET | Freq: Four times a day (QID) | ORAL | 0 refills | Status: AC

## 2019-10-29 MED ORDER — DOXYCYCLINE HYCLATE 100 MG PO CAPS: 100.0000 mg | ORAL_CAPSULE | Freq: Two times a day (BID) | ORAL | 0 refills | Status: AC

## 2019-10-29 NOTE — Telephone Encounter (Signed)
Called and spoke to patient regarding treatment of h. Pylori. I had sent in triple therapy for her yesterday and after discussing with GI, she likely did not get full eradication from last triple therapy and needs quadruple therapy. She was understanding of change of therapy and will pick up new medication today. Mychart message with instructions sent to patient.

## 2019-11-11 DIAGNOSIS — F4320 Adjustment disorder, unspecified: Secondary | ICD-10-CM | POA: Diagnosis not present

## 2019-11-17 NOTE — Progress Notes (Signed)
49 y.o. G56P2002 Divorced Caucasian female here for annual exam.    Possible ?cyst/nevus on mon pubis patient would like doctor to evaluate.  She was just treated for H pylori.  She is having rectal bleeding due to a lot of wiping.   Taking Zoloft and seeing a therapist.   She wants to stop smoking and start exercising.   Menses are light and occur monthly.  Maybe has some hot flashes.   Working at a [employer].  She did her first [place].   PCP:  Clarene Reamer, NP   Patient's last menstrual period was 11/09/2019 (approximate).           Sexually active: No.  The current method of family planning is tubal ligation.    Exercising: No.  The patient does not participate in regular exercise at present. Smoker:  Yes, 1/2 ppd  Health Maintenance: Pap:  06-27-16 Neg:Neg HR HPV, 08-26-11 Neg History of abnormal Pap:  Yes, early 20's had HPV ?LEEP procedure, paps normal since [facility]: 12-24-18 3D/neg/density B/BiRads1 Colonoscopy: 02-14-13 normal;next 10 years  BMD:   n/a  Result  n/a TDaP:  06-27-16 Gardasil:   no HIV: 09-24-18 NR Hep C: 09-24-18 Neg Screening Labs:  PCP.  Flu vaccine:  Recommended.    reports that she has been smoking cigarettes. She has been smoking about 0.50 packs per day. She has never used smokeless tobacco. She reports previous alcohol use. She reports that she does not use drugs.  Past Medical History:  Diagnosis Date  . Anxiety   . Depression   . H. pylori infection 2018   treated with abx  . OCD (obsessive compulsive disorder)   . Seasonal allergies   . Social anxiety disorder 09/12/2013    Past Surgical History:  Procedure Laterality Date  . DILITATION & CURRETTAGE/HYSTROSCOPY WITH NOVASURE ABLATION  09/03/2012   Procedure: DILATATION & CURETTAGE/HYSTEROSCOPY WITH NOVASURE ABLATION;  Surgeon: Daria Pastures, MD;  Location: [facility];  Service: Gynecology;  Laterality: N/A;  . LAPAROSCOPIC TUBAL LIGATION  09/03/2012   Procedure:  LAPAROSCOPIC TUBAL LIGATION;  Surgeon: Daria Pastures, MD;  Location: [facility];  Service: Gynecology;  Laterality: Bilateral;  FILSHIE CLIPS  . LAPAROSCOPY  10/14/2012   Procedure: LAPAROSCOPY OPERATIVE with LSO and LOA;  Surgeon: Farrel Gobble. Harrington Challenger, MD;  Location: [facility];  Service: Gynecology;  Laterality: N/A;  . LAPAROTOMY  10/14/2012   Procedure: EXPLORATORY LAPAROTOMY;  Surgeon: Farrel Gobble. Harrington Challenger, MD;  Location: [facility];  Service: Gynecology;  Laterality: N/A;  . NASAL SEPTUM SURGERY    . OOPHORECTOMY  10/14/2012   Left ovary removed--hx "ruptured chocolate cyst"  . TONSILLECTOMY      Current Outpatient Medications  Medication Sig Dispense Refill  . fexofenadine (ALLEGRA) 60 MG tablet Take 60 mg 2 (two) times daily by mouth.    . fluticasone (FLONASE) 50 MCG/ACT nasal spray Place 2 sprays into both nostrils daily. 16 g 1  . ibuprofen (ADVIL,MOTRIN) 800 MG tablet Take 800 mg by mouth every 8 (eight) hours as needed for pain.    Marland Kitchen propranolol (INDERAL) 10 MG tablet 1 - 2 tablets 30 minutes before social situation 60 tablet 5  . sertraline (ZOLOFT) 100 MG tablet Take 1 tablet (100 mg total) by mouth daily. 90 tablet 3   No current facility-administered medications for this visit.    Family History  Problem Relation Age of Onset  . Depression Mother   . Fibromyalgia Mother   . Breast cancer Mother  27  . Rheum arthritis Mother   . Alcohol abuse Father   . Emphysema Maternal Grandmother        Dec COPD  . Breast cancer Maternal Grandmother 52  . Multiple sclerosis Maternal Grandfather   . Breast cancer Sister 74  . Breast cancer Paternal Aunt 67       A & W    Review of Systems  All other systems reviewed and are negative.   Exam:   BP 120/64 (Cuff Size: Large)   Pulse 70   Temp (!) 97.3 F (36.3 C) (Temporal)   Resp 14   Ht 5\' 3"  (1.6 m)   Wt 186 lb 12.8 oz (84.7 kg)   LMP 11/09/2019 (Approximate)   BMI 33.09 kg/m     General appearance: alert, cooperative and appears  stated age Head: normocephalic, without obvious abnormality, atraumatic Neck: no adenopathy, supple, symmetrical, trachea midline and thyroid normal to inspection and palpation Lungs: clear to auscultation bilaterally Breasts: normal appearance, no masses or tenderness, No nipple retraction or dimpling, No nipple discharge or bleeding, No axillary adenopathy Heart: regular rate and rhythm Abdomen: soft, mildly tender upper abdomen to right of xiphoid,  no masses, no organomegaly Extremities: extremities normal, atraumatic, no cyanosis or edema Skin: skin color, texture, turgor normal. No rashes or lesions Lymph nodes: cervical, supraclavicular, and axillary nodes normal. Neurologic: grossly normal  Pelvic: External genitalia:  sebaceous of vulva and right labia minora.               No abnormal inguinal nodes palpated.              Urethra:  normal appearing urethra with no masses, tenderness or lesions              Bartholins and Skenes: normal                 Vagina: normal appearing vagina with normal color and discharge, no lesions              Cervix: no lesions              Pap taken: No. Bimanual Exam:  Uterus:  normal size, contour, position, consistency, mobility, non-tender              Adnexa: no mass, fullness, tenderness              Rectal exam: Yes.  .  Confirms.              Anus:  normal sphincter tone, small hemorrhoids.  Chaperone was present for exam.  Assessment:   Well woman visit with normal exam. FH of breast cancer.Genetic testing negative for mother and sister. Increased lifetime risk of breast cancer:  28.3% Remote hx of HPV.  Status post BTL and endometrial ablation.  Status post laparoscopic LSO and LOA for ruptured endometrioma. Smoker.  Anxiety/stress/depression. On Zoloft.  Rectal bleeding.  Treating H Pylori.  Hemorrhoids.  Sebaceous cysts of vulva.   Plan: Mammogram screening discussed. Self breast awareness reviewed. Pap and HR HPV as  above. Guidelines for Calcium, Vitamin D, regular exercise program including cardiovascular and weight bearing exercise. We discussed breast MRI.  She would like to do this.  I placed an order.  We reviewed Effexor as a way to treat anxiety and depression and treat hot flashes.  I support her in smoking cessation and increasing exercise. Follow up annually and prn.   After visit summary provided.

## 2019-11-21 ENCOUNTER — Encounter: Payer: Self-pay | Admitting: Obstetrics and Gynecology

## 2019-11-21 ENCOUNTER — Other Ambulatory Visit: Payer: Self-pay

## 2019-11-21 ENCOUNTER — Ambulatory Visit: Payer: BC Managed Care – PPO | Admitting: Obstetrics and Gynecology

## 2019-11-21 VITALS — BP 120/64 | HR 70 | Temp 97.3°F | Resp 14 | Ht 63.0 in | Wt 186.8 lb

## 2019-11-21 DIAGNOSIS — Z9189 Other specified personal risk factors, not elsewhere classified: Secondary | ICD-10-CM

## 2019-11-21 DIAGNOSIS — Z01419 Encounter for gynecological examination (general) (routine) without abnormal findings: Secondary | ICD-10-CM

## 2019-11-21 NOTE — Patient Instructions (Signed)

## 2019-12-02 ENCOUNTER — Other Ambulatory Visit: Payer: Self-pay | Admitting: Obstetrics and Gynecology

## 2019-12-02 DIAGNOSIS — Z1231 Encounter for screening mammogram for malignant neoplasm of breast: Secondary | ICD-10-CM

## 2019-12-05 DIAGNOSIS — F4320 Adjustment disorder, unspecified: Secondary | ICD-10-CM | POA: Diagnosis not present

## 2019-12-15 DIAGNOSIS — F4320 Adjustment disorder, unspecified: Secondary | ICD-10-CM | POA: Diagnosis not present

## 2019-12-22 ENCOUNTER — Other Ambulatory Visit: Payer: BC Managed Care – PPO

## 2019-12-22 ENCOUNTER — Other Ambulatory Visit: Payer: Self-pay

## 2019-12-22 DIAGNOSIS — A048 Other specified bacterial intestinal infections: Secondary | ICD-10-CM | POA: Diagnosis not present

## 2019-12-23 DIAGNOSIS — F4320 Adjustment disorder, unspecified: Secondary | ICD-10-CM | POA: Diagnosis not present

## 2019-12-23 LAB — HELICOBACTER PYLORI  SPECIAL ANTIGEN
MICRO NUMBER:: 10214908
RESULT:: DETECTED — AB
SPECIMEN QUALITY: ADEQUATE

## 2019-12-26 MED ORDER — LEVOFLOXACIN 500 MG PO TABS: 500.0000 mg | ORAL_TABLET | Freq: Every day | ORAL | 0 refills | Status: AC

## 2019-12-26 MED ORDER — OMEPRAZOLE 20 MG PO CPDR: 20.0000 mg | DELAYED_RELEASE_CAPSULE | Freq: Two times a day (BID) | ORAL | 0 refills | Status: DC

## 2019-12-26 MED ORDER — AMOXICILLIN 500 MG PO CAPS: 1000.0000 mg | ORAL_CAPSULE | Freq: Two times a day (BID) | ORAL | 0 refills | Status: AC

## 2019-12-28 NOTE — Progress Notes (Signed)
This encounter was created in error - please disregard.

## 2019-12-30 DIAGNOSIS — F4320 Adjustment disorder, unspecified: Secondary | ICD-10-CM | POA: Diagnosis not present

## 2020-01-06 ENCOUNTER — Ambulatory Visit
Admission: RE | Admit: 2020-01-06 | Discharge: 2020-01-06 | Disposition: A | Discharge status: Home / Self Care | Payer: BC Managed Care – PPO | Source: Ambulatory Visit | Attending: Obstetrics and Gynecology | Admitting: Obstetrics and Gynecology

## 2020-01-06 ENCOUNTER — Other Ambulatory Visit: Payer: Self-pay

## 2020-01-06 DIAGNOSIS — Z1231 Encounter for screening mammogram for malignant neoplasm of breast: Secondary | ICD-10-CM | POA: Diagnosis not present

## 2020-01-06 DIAGNOSIS — F4320 Adjustment disorder, unspecified: Secondary | ICD-10-CM | POA: Diagnosis not present

## 2020-01-06 IMAGING — MG DIGITAL SCREENING BILAT W/ TOMO W/ CAD
8 series · 9 of 24 positions shown · non-contrast
Comparison: Previous exam(s).

CLINICAL DATA: Screening. Strong family history of breast cancer.

EXAM:
DIGITAL SCREENING BILATERAL MAMMOGRAM WITH TOMO AND CAD

[R CC synth-2D]
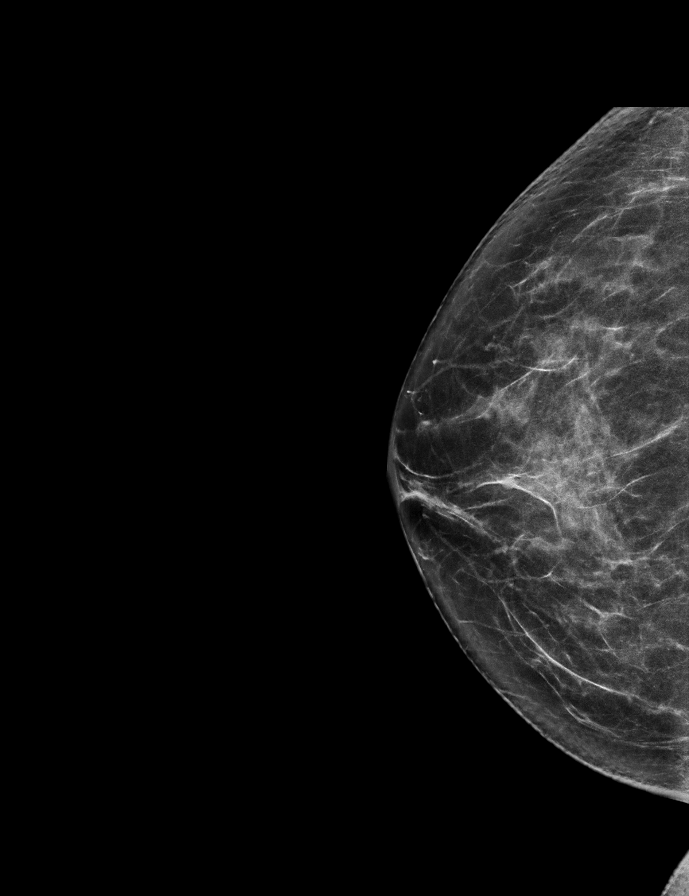

[R MLO synth-2D]
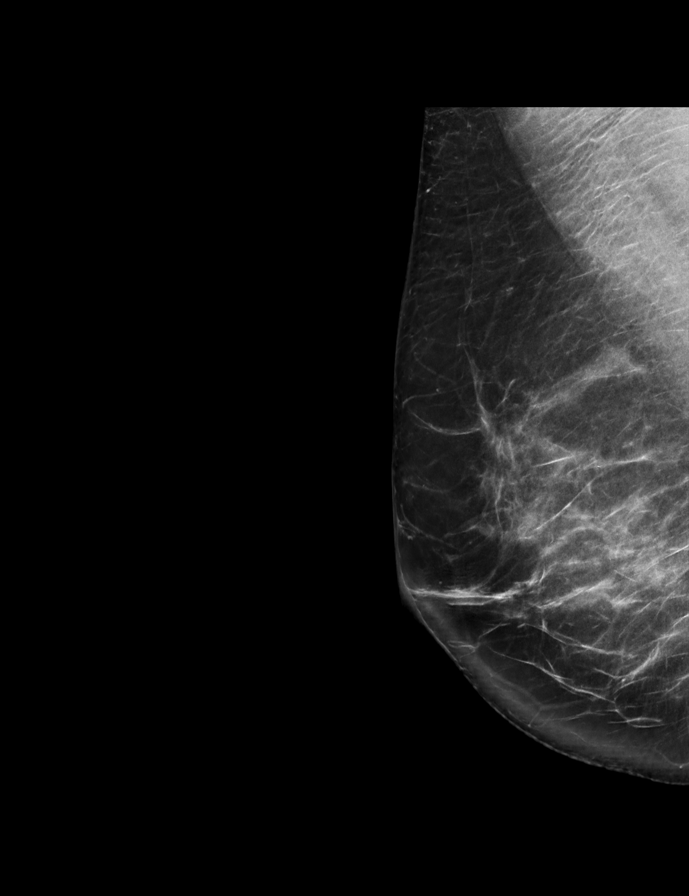

[L MLO synth-2D]
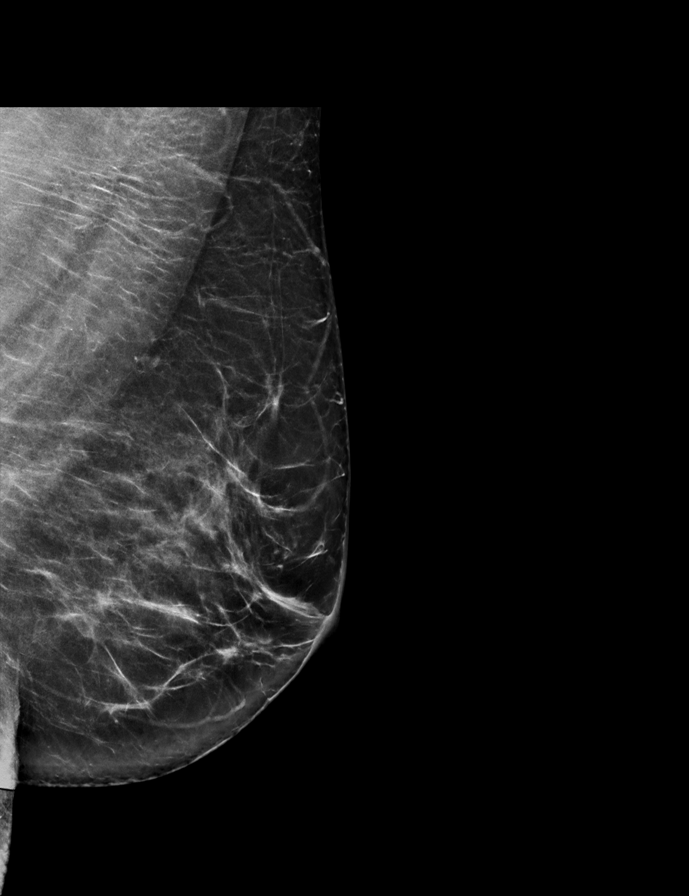

[L CC synth-2D]
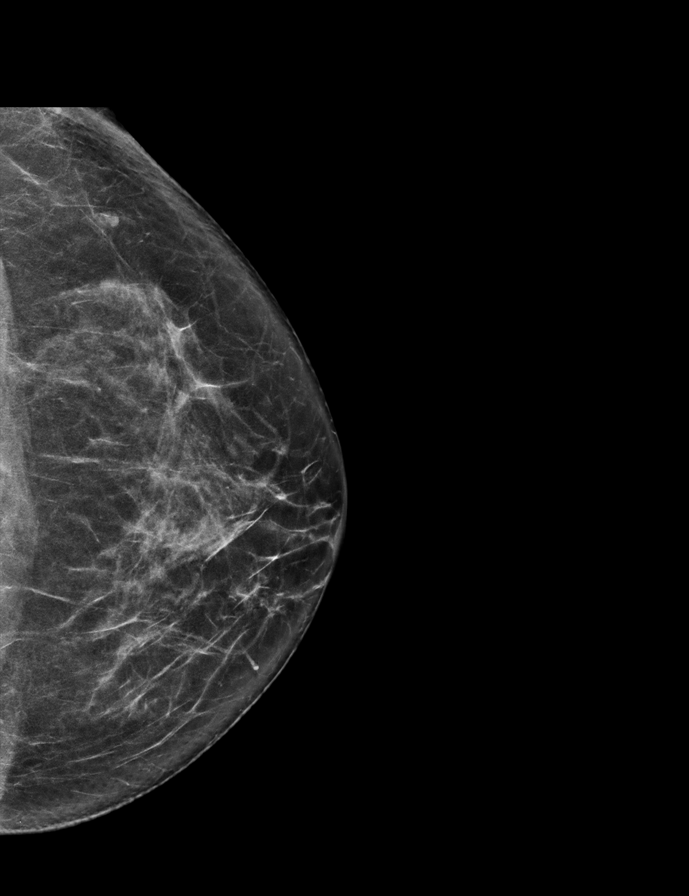

[R CC tomo · 2 of 66 frames shown]
[frame 22/66]
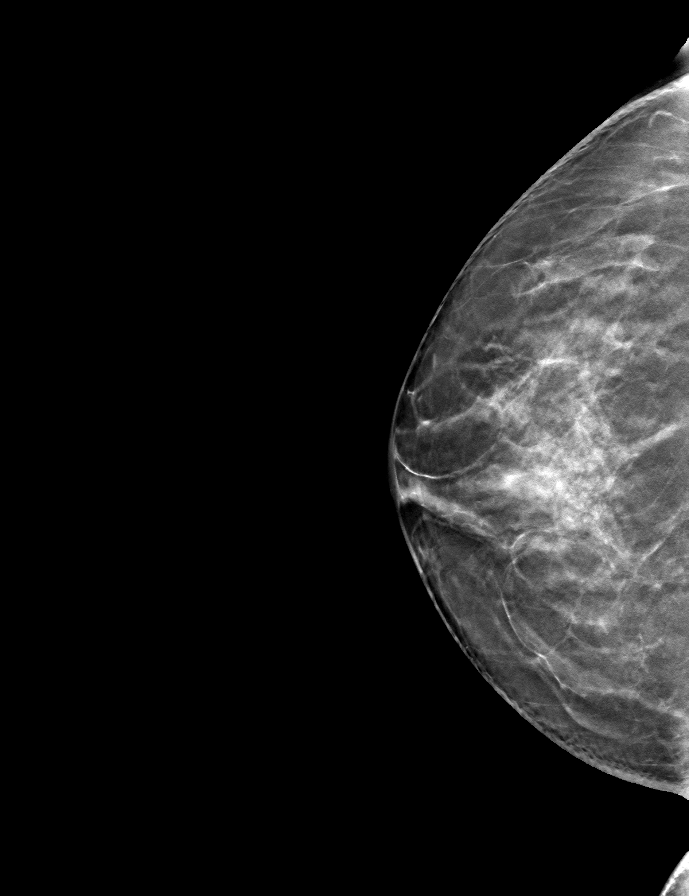
[frame 33/66]
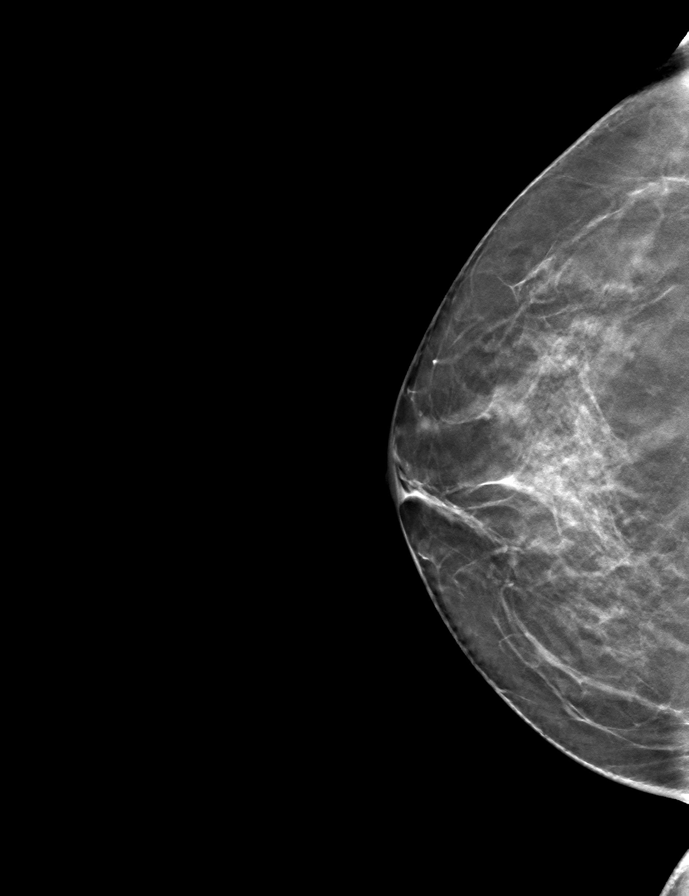

[L CC tomo · tomo slice 33/66.0]
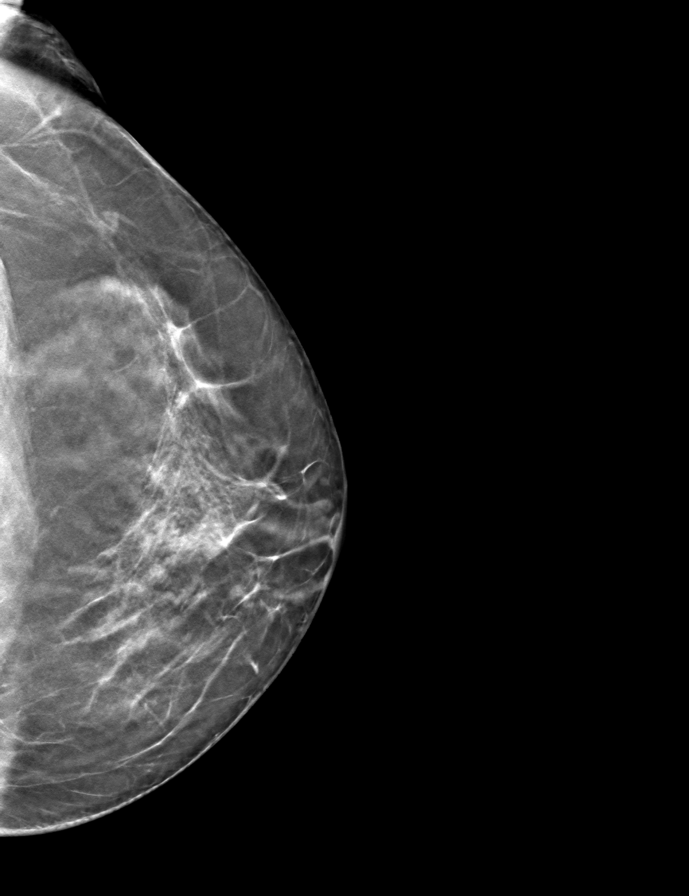

[R MLO tomo · tomo slice 39/77.0]
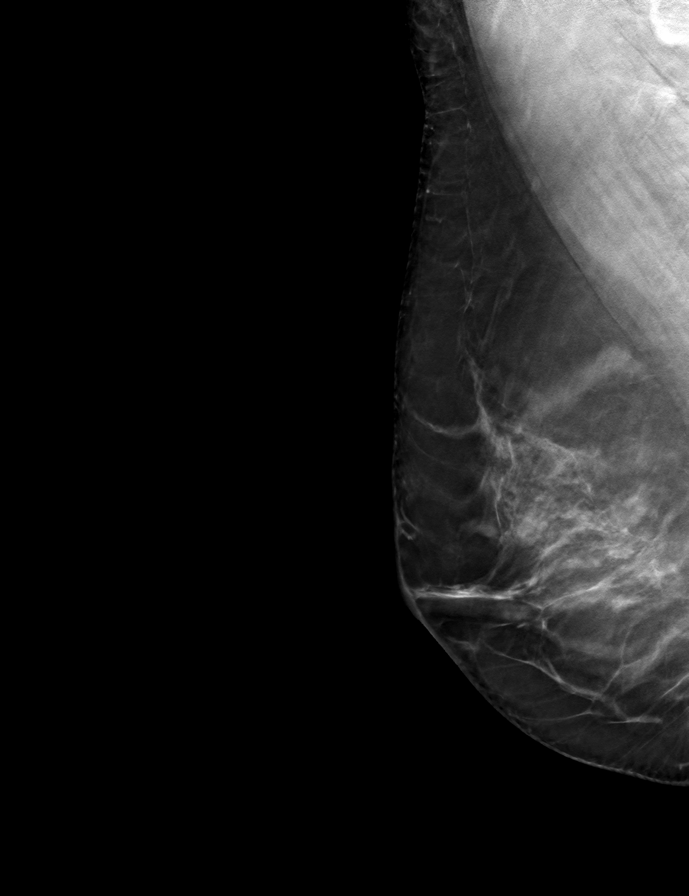

[L MLO tomo · tomo slice 37/73.0]
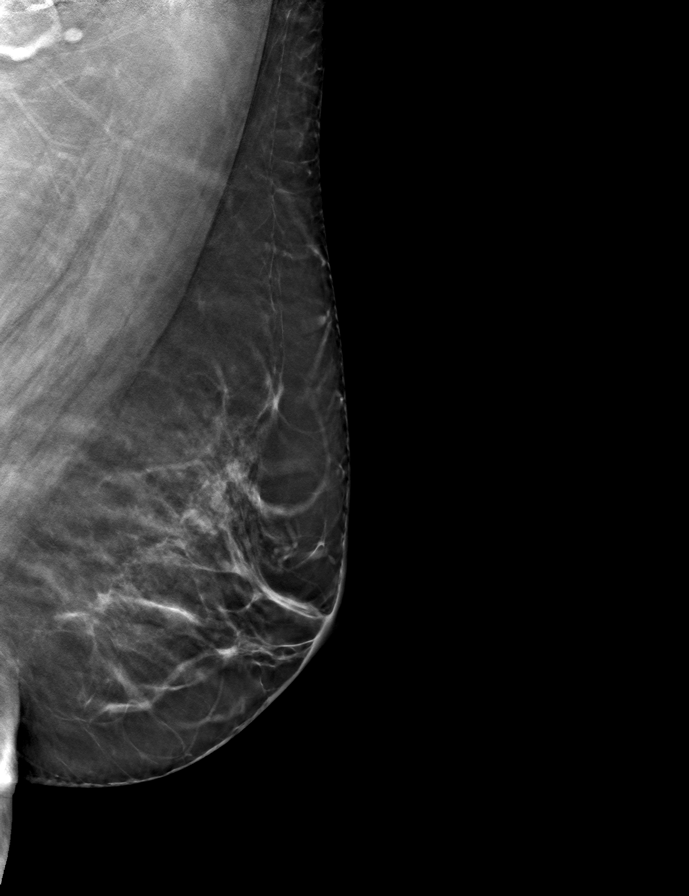

[9 of 24 positions shown; findings below may reference images not displayed]

ACR Breast Density Category b: There are scattered areas of
fibroglandular density.
FINDINGS: In the right breast, a possible developing asymmetry warrants
further evaluation. This is seen in the superior right breast on the
MLO projection only. In the left breast, no findings suspicious for
malignancy. Images were processed with CAD.
IMPRESSION: Further evaluation is suggested for possible asymmetry in the right
breast.

RECOMMENDATION:
Diagnostic mammogram and possibly ultrasound of the right breast.
(Code:[3G])

The patient will be contacted regarding the findings, and additional
imaging will be scheduled.

BI-RADS CATEGORY  0: Incomplete. Need additional imaging evaluation
and/or prior mammograms for comparison.

## 2020-01-09 ENCOUNTER — Other Ambulatory Visit: Payer: Self-pay | Admitting: Obstetrics and Gynecology

## 2020-01-09 DIAGNOSIS — R928 Other abnormal and inconclusive findings on diagnostic imaging of breast: Secondary | ICD-10-CM

## 2020-01-24 DIAGNOSIS — F4320 Adjustment disorder, unspecified: Secondary | ICD-10-CM | POA: Diagnosis not present

## 2020-01-27 ENCOUNTER — Other Ambulatory Visit: Payer: Self-pay | Admitting: Obstetrics and Gynecology

## 2020-01-27 ENCOUNTER — Other Ambulatory Visit: Payer: Self-pay

## 2020-01-27 ENCOUNTER — Ambulatory Visit
Admission: RE | Admit: 2020-01-27 | Discharge: 2020-01-27 | Disposition: A | Discharge status: Home / Self Care | Payer: BC Managed Care – PPO | Source: Ambulatory Visit | Attending: Obstetrics and Gynecology | Admitting: Obstetrics and Gynecology

## 2020-01-27 DIAGNOSIS — N6489 Other specified disorders of breast: Secondary | ICD-10-CM | POA: Diagnosis not present

## 2020-01-27 DIAGNOSIS — R928 Other abnormal and inconclusive findings on diagnostic imaging of breast: Secondary | ICD-10-CM

## 2020-01-27 IMAGING — US US BREAST*R* LIMITED INC AXILLA
1 series · 5 of 5 positions shown · non-contrast
Comparison: Prior exams
COMPARISON: Prior exams

Addendum:
CLINICAL DATA: Screening recall for a possible developing asymmetry
in the right breast. Patient has a strong family history for breast
carcinoma.

EXAM:
DIGITAL DIAGNOSTIC RIGHT MAMMOGRAM WITH CAD AND TOMO
ULTRASOUND RIGHT BREAST

[Series 1: us breast*right* limited inc axilla · 0.07mm/px · 5 of 5 slices shown]
[im 1/5]
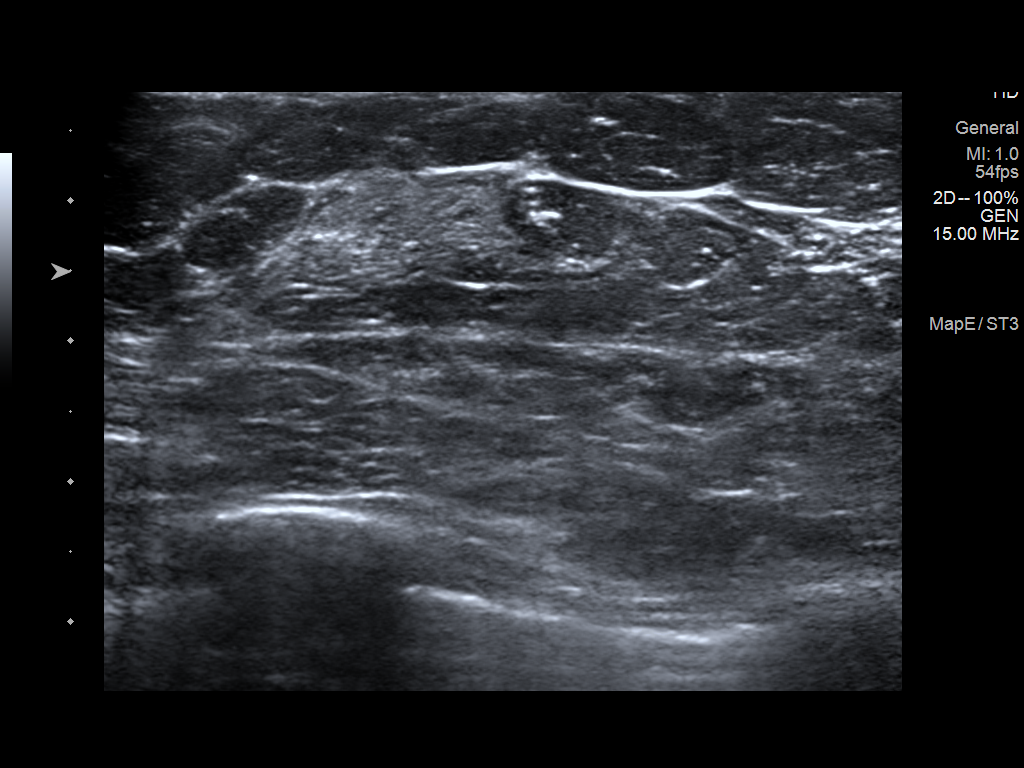
[im 2/5]
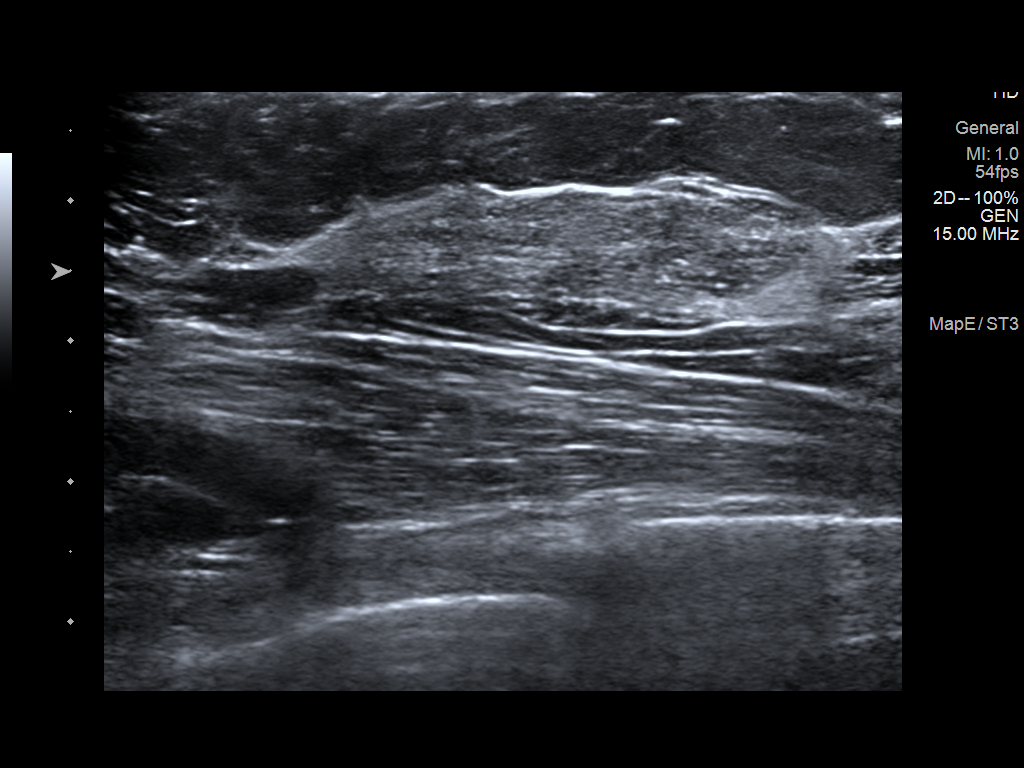
[im 3/5]
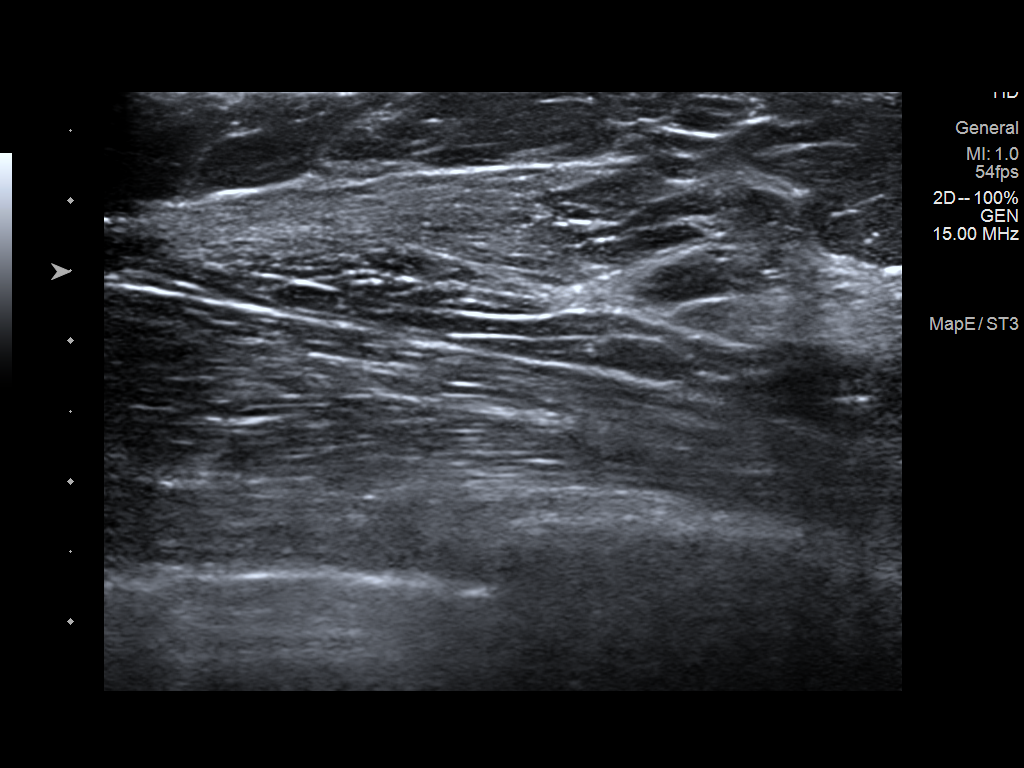
[im 4/5]
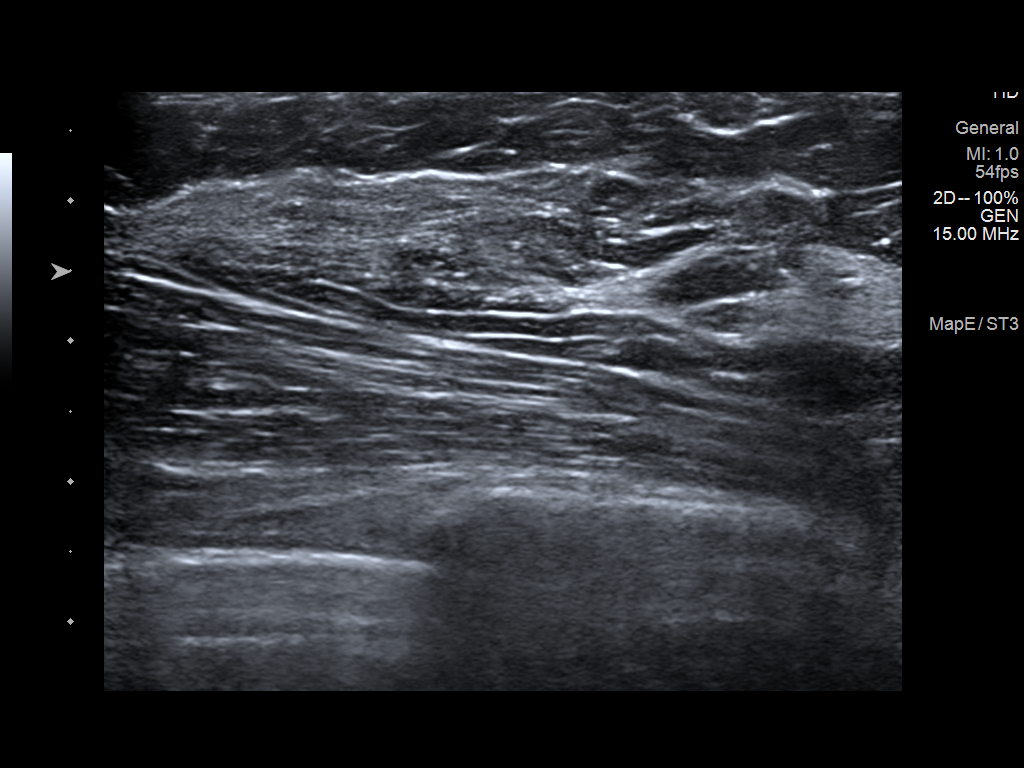
[im 5/5]
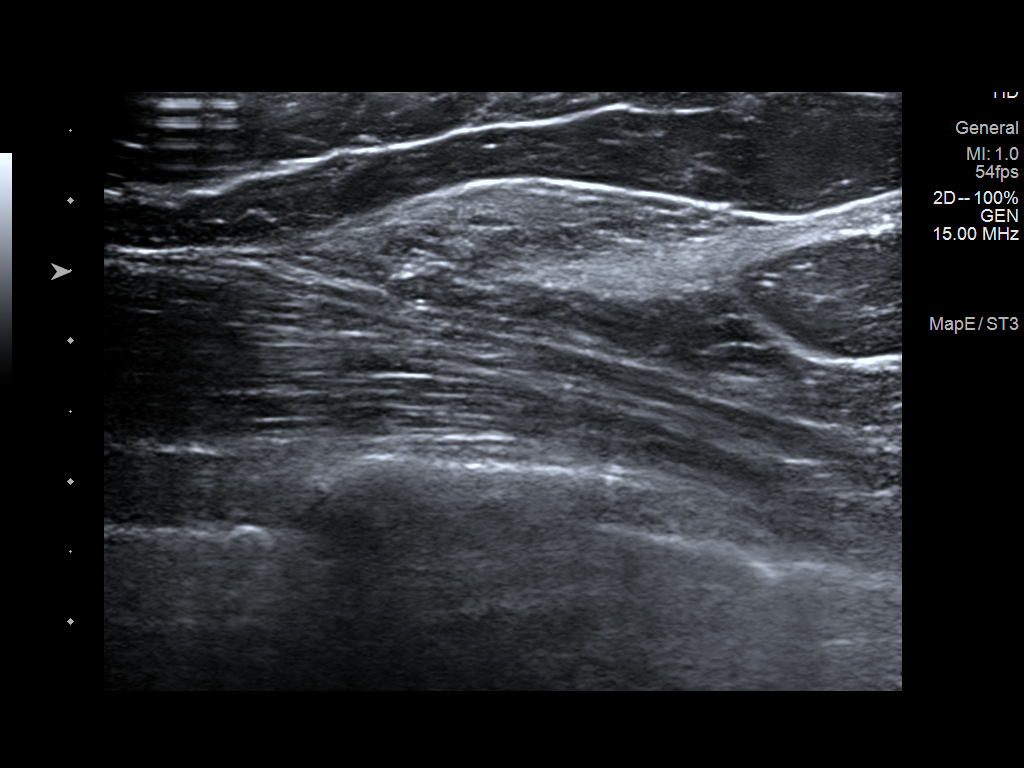

[5 of 5 positions shown; findings below may reference images not displayed]

ACR Breast Density Category b: There are scattered areas of
fibroglandular density.
FINDINGS: The possible developing asymmetry persists on the diagnostic
spot-compression MLO view, but is not apparent on the mL view. On
the spot-compression MLO view, this appears as a focal area density,
without defined margins and with interspersed fat attenuation
suggesting normal fibroglandular tissue. It spans approximately
cm.

Mammographic images were processed with CAD.

Targeted ultrasound is performed, showing normal appearing tissue in
the right breast, upper outer quadrant, although the echogenicity of
the fibroglandular tissue more posteriorly is slightly darker than
that seen more anteriorly. There is no defined mass or sonographic
architectural distortion.
IMPRESSION: 1. The possible developing asymmetry noted on screening persists on
the diagnostic imaging, with no sonographic correlate. This is only
visualized on the MLO view. Although this is likely benign/normal
fibroglandular tissue, given the patient's significant family
history, tissue sampling is recommended.

RECOMMENDATION:
1. Stereotactic core needle biopsy the area of asymmetry in the
right breast. If this area cannot be reproduced at the time of
biopsy, then a six-month follow-up right breast mammogram with
possible ultrasound would be recommended, with consideration breast
MRI as a problem-solving technique.
2. Right axilla will need to be interrogated with ultrasound prior
to biopsy, which was inadvertently not performed at the time of
diagnostic imaging.

I have discussed the findings and recommendations with the patient.
If applicable, a reminder letter will be sent to the patient
regarding the next appointment.

BI-RADS CATEGORY  4: Suspicious.

ADDENDUM:
The patient returned [DATE] for a stereotactic biopsy right
breast asymmetry. The asymmetry was unable to be reproduced on the
preparatory mammogram images. Therefore, I recommend a six-month
follow-up diagnostic right breast mammogram. I also recommend annual
high risk screening breast MRI due to the patient's strong family
history, and dense breast tissue. The MRI may be done at any time
now.

BI-RADS 3: Likely benign.

*** End of Addendum ***
ACR Breast Density Category b: There are scattered areas of
fibroglandular density.
FINDINGS: The possible developing asymmetry persists on the diagnostic
spot-compression MLO view, but is not apparent on the mL view. On
the spot-compression MLO view, this appears as a focal area density,
without defined margins and with interspersed fat attenuation
suggesting normal fibroglandular tissue. It spans approximately
cm.

Mammographic images were processed with CAD.

Targeted ultrasound is performed, showing normal appearing tissue in
the right breast, upper outer quadrant, although the echogenicity of
the fibroglandular tissue more posteriorly is slightly darker than
that seen more anteriorly. There is no defined mass or sonographic
architectural distortion.
IMPRESSION: 1. The possible developing asymmetry noted on screening persists on
the diagnostic imaging, with no sonographic correlate. This is only
visualized on the MLO view. Although this is likely benign/normal
fibroglandular tissue, given the patient's significant family
history, tissue sampling is recommended.

RECOMMENDATION:
1. Stereotactic core needle biopsy the area of asymmetry in the
right breast. If this area cannot be reproduced at the time of
biopsy, then a six-month follow-up right breast mammogram with
possible ultrasound would be recommended, with consideration breast
MRI as a problem-solving technique.
2. Right axilla will need to be interrogated with ultrasound prior
to biopsy, which was inadvertently not performed at the time of
diagnostic imaging.

I have discussed the findings and recommendations with the patient.
If applicable, a reminder letter will be sent to the patient
regarding the next appointment.

BI-RADS CATEGORY  4: Suspicious.

## 2020-01-27 IMAGING — MG MM DIGITAL DIAGNOSTIC UNILAT*R* W/ TOMO W/ CAD
3 of 10 series · 3 of 38 positions shown · non-contrast
Comparison: Prior exams
COMPARISON: Prior exams

Addendum:
CLINICAL DATA: Screening recall for a possible developing asymmetry
in the right breast. Patient has a strong family history for breast
carcinoma.

EXAM:
DIGITAL DIAGNOSTIC RIGHT MAMMOGRAM WITH CAD AND TOMO
ULTRASOUND RIGHT BREAST

[R ML synth-2D]
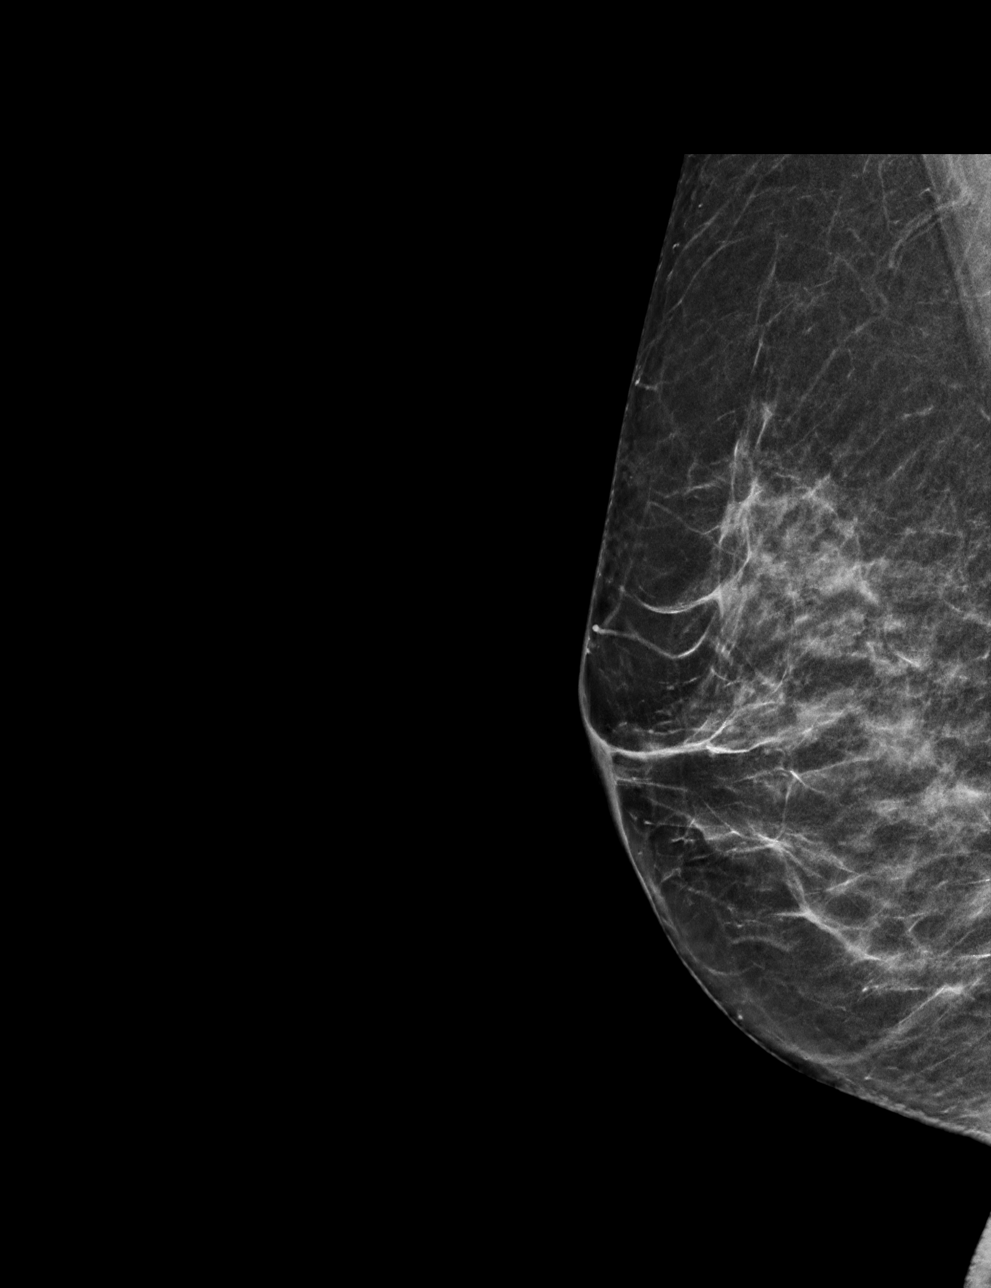

[R MLO synth-2D]
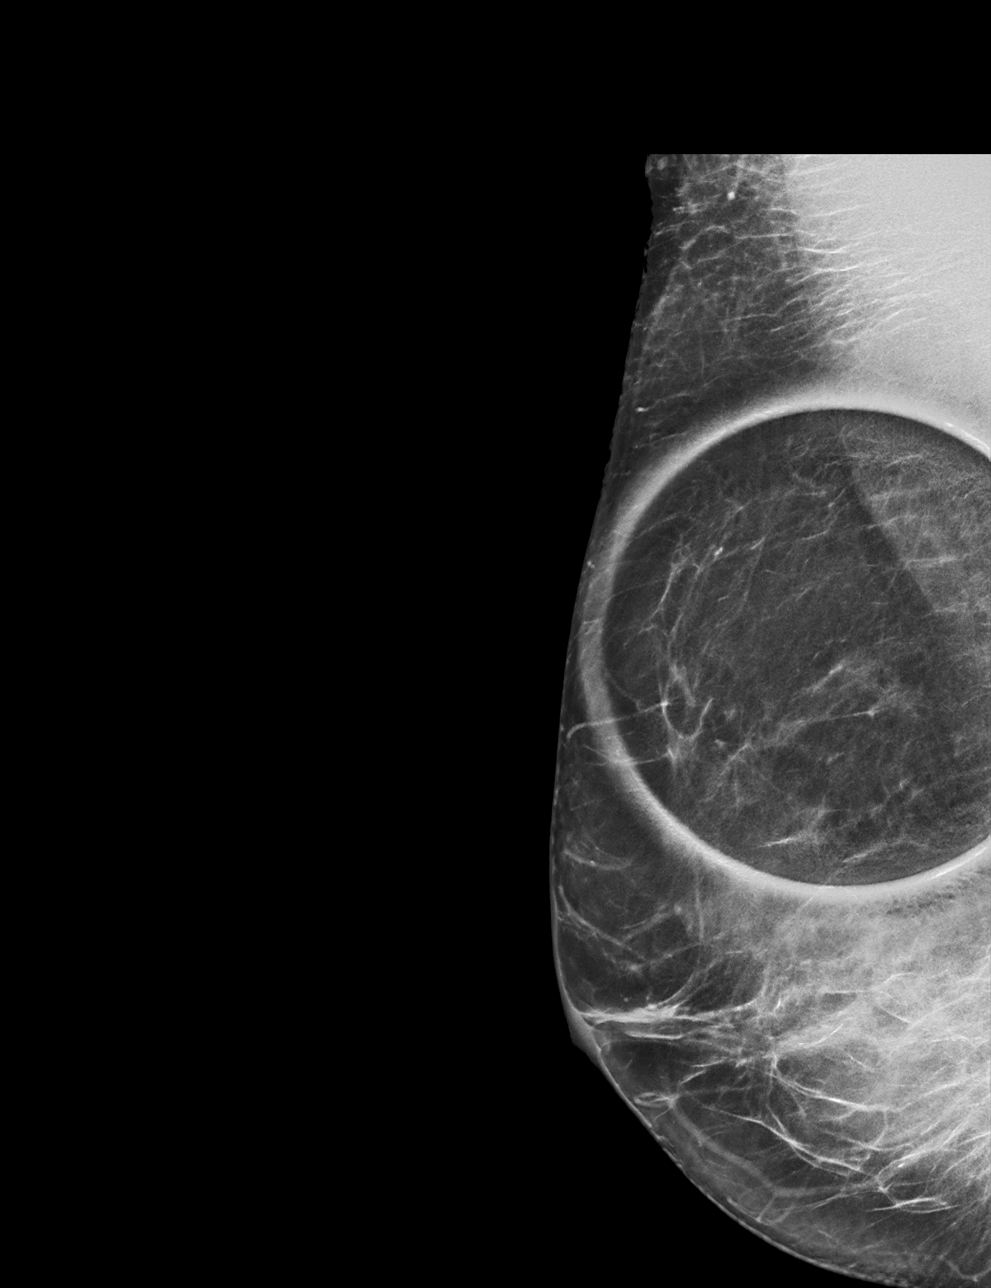

[R XCCL synth-2D]
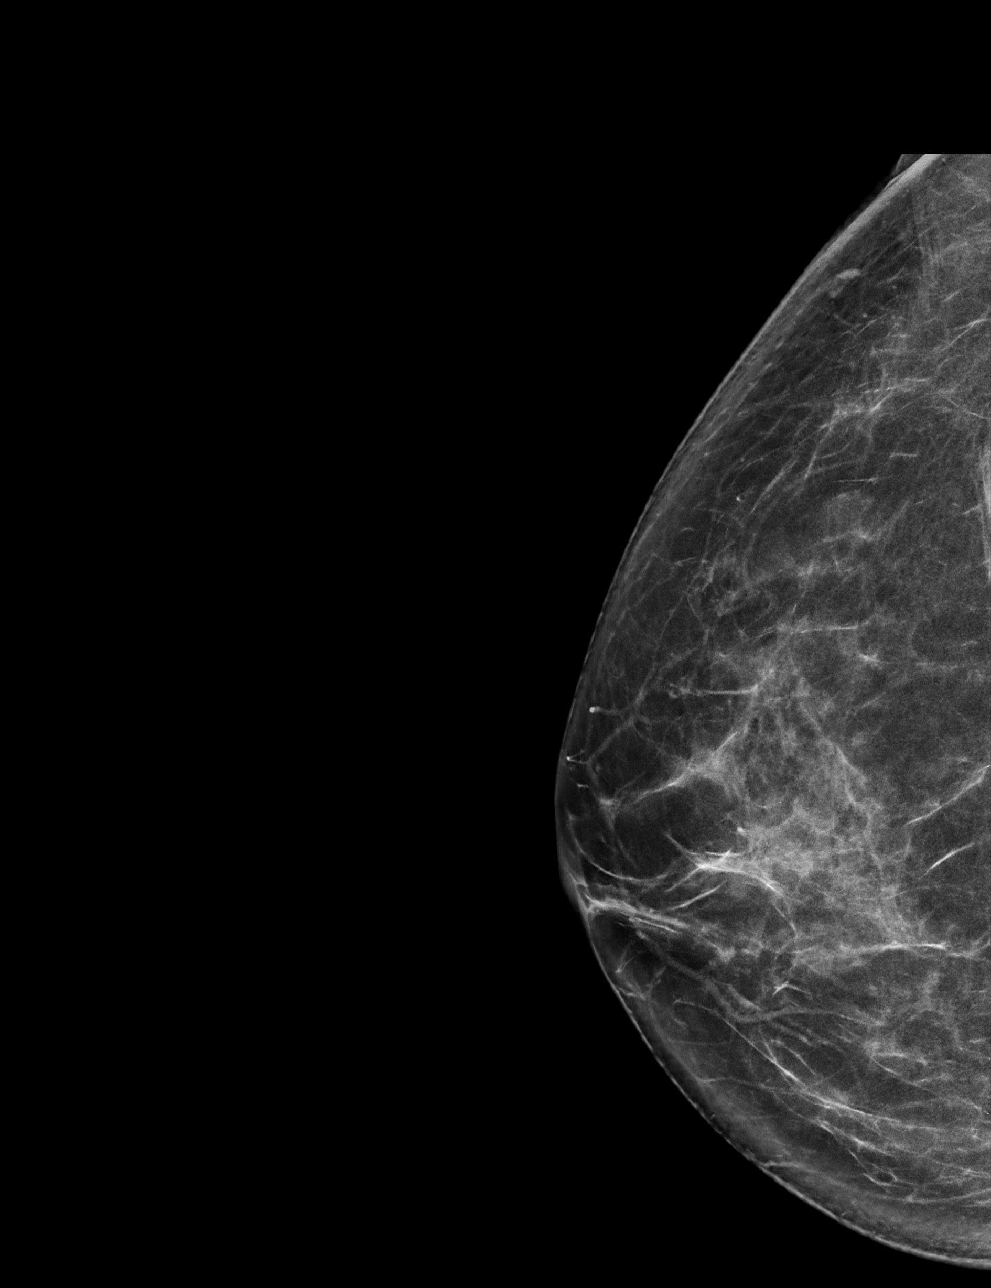

[3 of 38 positions shown; findings below may reference images not displayed]

ACR Breast Density Category b: There are scattered areas of
fibroglandular density.
FINDINGS: The possible developing asymmetry persists on the diagnostic
spot-compression MLO view, but is not apparent on the mL view. On
the spot-compression MLO view, this appears as a focal area density,
without defined margins and with interspersed fat attenuation
suggesting normal fibroglandular tissue. It spans approximately
cm.

Mammographic images were processed with CAD.

Targeted ultrasound is performed, showing normal appearing tissue in
the right breast, upper outer quadrant, although the echogenicity of
the fibroglandular tissue more posteriorly is slightly darker than
that seen more anteriorly. There is no defined mass or sonographic
architectural distortion.
IMPRESSION: 1. The possible developing asymmetry noted on screening persists on
the diagnostic imaging, with no sonographic correlate. This is only
visualized on the MLO view. Although this is likely benign/normal
fibroglandular tissue, given the patient's significant family
history, tissue sampling is recommended.

RECOMMENDATION:
1. Stereotactic core needle biopsy the area of asymmetry in the
right breast. If this area cannot be reproduced at the time of
biopsy, then a six-month follow-up right breast mammogram with
possible ultrasound would be recommended, with consideration breast
MRI as a problem-solving technique.
2. Right axilla will need to be interrogated with ultrasound prior
to biopsy, which was inadvertently not performed at the time of
diagnostic imaging.

I have discussed the findings and recommendations with the patient.
If applicable, a reminder letter will be sent to the patient
regarding the next appointment.

BI-RADS CATEGORY  4: Suspicious.

ADDENDUM:
The patient returned [DATE] for a stereotactic biopsy right
breast asymmetry. The asymmetry was unable to be reproduced on the
preparatory mammogram images. Therefore, I recommend a six-month
follow-up diagnostic right breast mammogram. I also recommend annual
high risk screening breast MRI due to the patient's strong family
history, and dense breast tissue. The MRI may be done at any time
now.

BI-RADS 3: Likely benign.

*** End of Addendum ***
ACR Breast Density Category b: There are scattered areas of
fibroglandular density.
FINDINGS: The possible developing asymmetry persists on the diagnostic
spot-compression MLO view, but is not apparent on the mL view. On
the spot-compression MLO view, this appears as a focal area density,
without defined margins and with interspersed fat attenuation
suggesting normal fibroglandular tissue. It spans approximately
cm.

Mammographic images were processed with CAD.

Targeted ultrasound is performed, showing normal appearing tissue in
the right breast, upper outer quadrant, although the echogenicity of
the fibroglandular tissue more posteriorly is slightly darker than
that seen more anteriorly. There is no defined mass or sonographic
architectural distortion.
IMPRESSION: 1. The possible developing asymmetry noted on screening persists on
the diagnostic imaging, with no sonographic correlate. This is only
visualized on the MLO view. Although this is likely benign/normal
fibroglandular tissue, given the patient's significant family
history, tissue sampling is recommended.

RECOMMENDATION:
1. Stereotactic core needle biopsy the area of asymmetry in the
right breast. If this area cannot be reproduced at the time of
biopsy, then a six-month follow-up right breast mammogram with
possible ultrasound would be recommended, with consideration breast
MRI as a problem-solving technique.
2. Right axilla will need to be interrogated with ultrasound prior
to biopsy, which was inadvertently not performed at the time of
diagnostic imaging.

I have discussed the findings and recommendations with the patient.
If applicable, a reminder letter will be sent to the patient
regarding the next appointment.

BI-RADS CATEGORY  4: Suspicious.

## 2020-02-03 ENCOUNTER — Other Ambulatory Visit: Payer: Self-pay | Admitting: Obstetrics and Gynecology

## 2020-02-03 ENCOUNTER — Ambulatory Visit
Admission: RE | Admit: 2020-02-03 | Discharge: 2020-02-03 | Disposition: A | Discharge status: Home / Self Care | Payer: BC Managed Care – PPO | Source: Ambulatory Visit | Attending: Obstetrics and Gynecology | Admitting: Obstetrics and Gynecology

## 2020-02-03 ENCOUNTER — Other Ambulatory Visit: Payer: Self-pay

## 2020-02-03 DIAGNOSIS — N6489 Other specified disorders of breast: Secondary | ICD-10-CM

## 2020-02-09 ENCOUNTER — Telehealth: Payer: Self-pay | Admitting: *Deleted

## 2020-02-09 NOTE — Telephone Encounter (Signed)
Burnice Logan, RN  02/09/2020 1:02 PM EDT    Left message to call Sharee Pimple, RN at [facility].   Placed in [facility] recall.  Patient removed from [facility] hold.

## 2020-02-09 NOTE — Telephone Encounter (Signed)
-----   Message from Nunzio Cobbs, MD sent at 02/08/2020  1:23 PM EDT ----- Please place in mammogram recall for October 2021 and offer to schedule bilateral breast MRI due to her increased risk of breast cancer.

## 2020-02-09 NOTE — Telephone Encounter (Signed)
Spoke with patient, advised as seen below per Dr. Quincy Simmonds. Patient has an active order for breast MRI, is currently on a wait list with [facility] to have imaging done with this menses. Patient verbalizes understanding and is agreeable.   Routing to provider for final review. Patient is agreeable to disposition. Will close encounter.  Cc: Wilson Singer, Magdalene Patricia

## 2020-02-10 ENCOUNTER — Telehealth: Payer: Self-pay | Admitting: Obstetrics and Gynecology

## 2020-02-10 DIAGNOSIS — F4320 Adjustment disorder, unspecified: Secondary | ICD-10-CM | POA: Diagnosis not present

## 2020-02-10 NOTE — Telephone Encounter (Signed)
Call placed to [facility], spoke with Red.  MRI Breast bilateral w/wo contrast Approved  [id number] Valid 02/10/20 -08/07/20  Routing to [employer] .   Encounter closed.

## 2020-02-10 NOTE — Telephone Encounter (Signed)
Amy Marks and [name]    I have started the pre cert for this patient for MRI BREAST. Please call [facility] at [phone] to give the clinical information. She is scheduled for 02/17/20. The rep Caprice Renshaw. Did not have a case number but stated you just need to give the patient's name, date of birth, ID number [id number] and the pending case would pull up.  Thanks, [employer]

## 2020-02-17 ENCOUNTER — Other Ambulatory Visit: Payer: BC Managed Care – PPO

## 2020-02-17 ENCOUNTER — Ambulatory Visit
Admission: RE | Admit: 2020-02-17 | Discharge: 2020-02-17 | Disposition: A | Discharge status: Home / Self Care | Payer: BC Managed Care – PPO | Source: Ambulatory Visit | Attending: Obstetrics and Gynecology | Admitting: Obstetrics and Gynecology

## 2020-02-17 ENCOUNTER — Other Ambulatory Visit: Payer: Self-pay | Admitting: Family Medicine

## 2020-02-17 DIAGNOSIS — Z9189 Other specified personal risk factors, not elsewhere classified: Secondary | ICD-10-CM

## 2020-02-17 DIAGNOSIS — A048 Other specified bacterial intestinal infections: Secondary | ICD-10-CM | POA: Diagnosis not present

## 2020-02-17 DIAGNOSIS — F411 Generalized anxiety disorder: Secondary | ICD-10-CM | POA: Diagnosis not present

## 2020-02-17 DIAGNOSIS — Z803 Family history of malignant neoplasm of breast: Secondary | ICD-10-CM | POA: Diagnosis not present

## 2020-02-17 IMAGING — MR MR BREAST BILAT WO/W CM
8 of 12 series · 33 of 48 positions shown · IV contrast (10 ml gadavist)
Comparison: Previous exam(s).

CLINICAL DATA: 48-year-old female with strong family history of
breast cancer presenting for high risk screening MRI.

LABS:  None performed on site.
EXAM:
BILATERAL BREAST MRI WITH AND WITHOUT CONTRAST
TECHNIQUE: Multiplanar, multisequence MR images of both breasts were obtained
prior to and following the intravenous administration of 10 ml of
Gadavist.

[Series 2: t2_tirm_tra ipat (a-p) · axial · 3.0mm · 0.70mm/px · 1 of 55 slices shown]
[im 1/55]
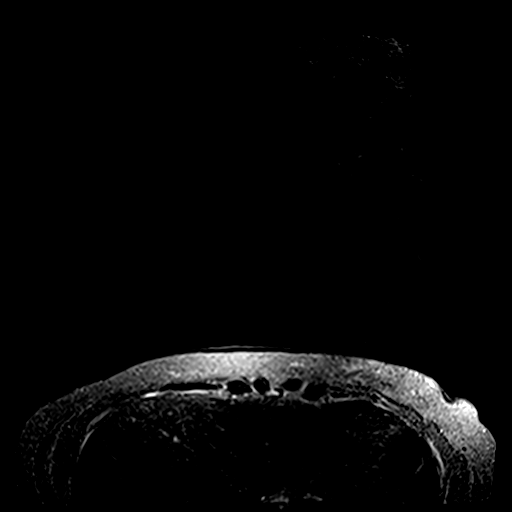

[Series 3: fl3d pre-cm no · axial · non-contrast · 1.2mm · 0.94mm/px · z∈[-98,+74]mm · 5 of 144 slices shown]
[im 1/144]
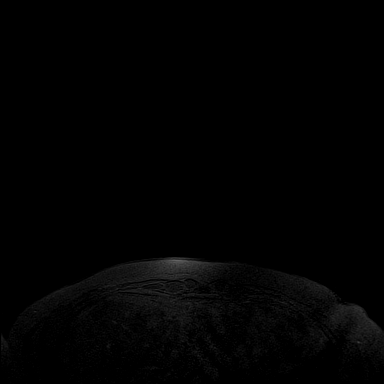
[im 36/144]
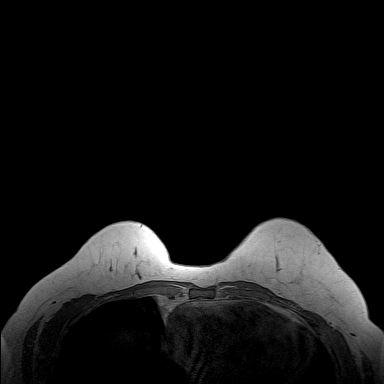
[im 72/144]
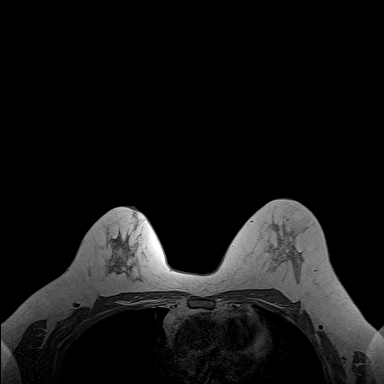
[im 108/144]
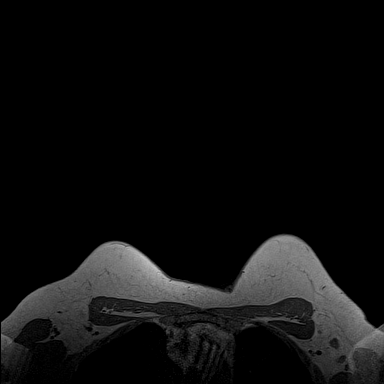
[im 144/144]
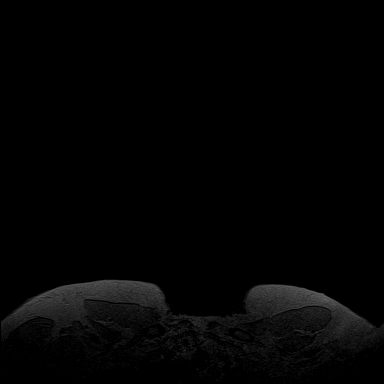

[Series 4: fl3d pre-cm · axial · non-contrast · 1.2mm · 0.94mm/px · z∈[-98,+74]mm · 5 of 144 slices shown]
[im 1/144]
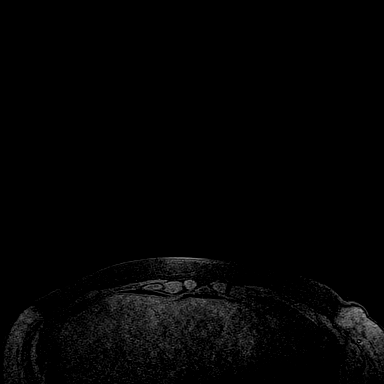
[im 36/144]
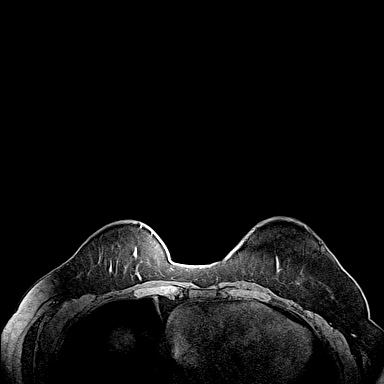
[im 72/144]
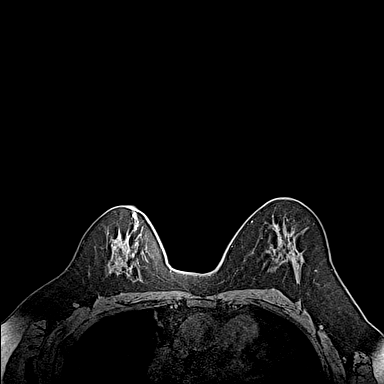
[im 108/144]
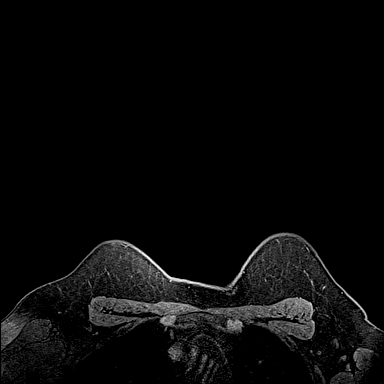
[im 144/144]
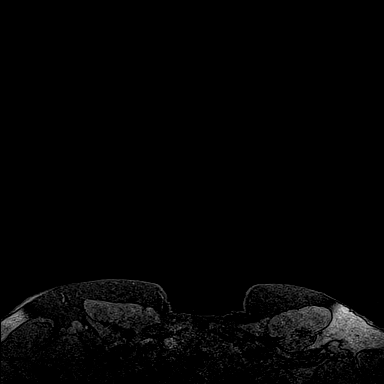

[Series 5: fl3d post-cm 20 · axial · 1.2mm · 0.94mm/px · z∈[-98,+74]mm · 5 of 144 slices shown (1 of 3)]
[im 1/144]
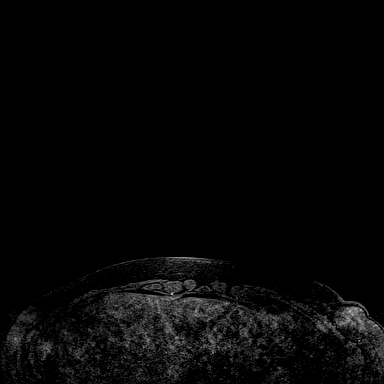
[im 36/144]
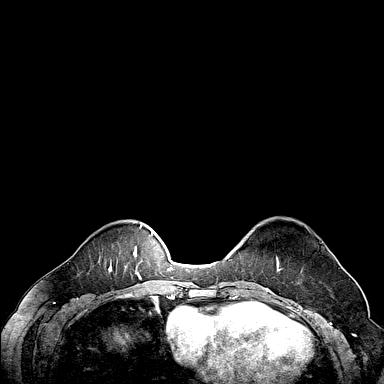
[im 72/144]
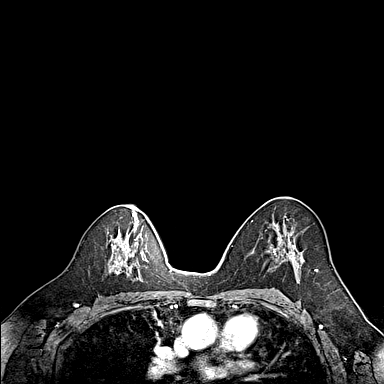
[im 108/144]
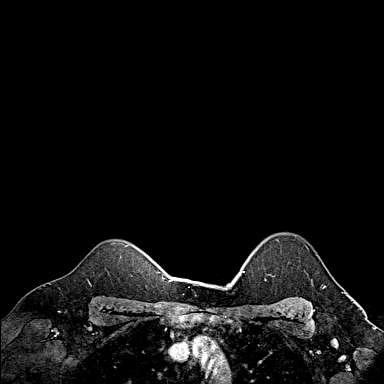
[im 144/144]
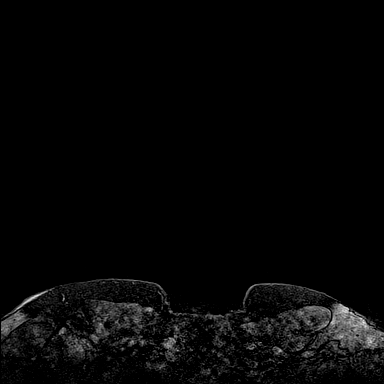

[Series 6: fl3d post-cm 20 · axial · 1.2mm · 0.94mm/px · z∈[-98,+74]mm · 5 of 144 slices shown (2 of 3)]
[im 1/144]
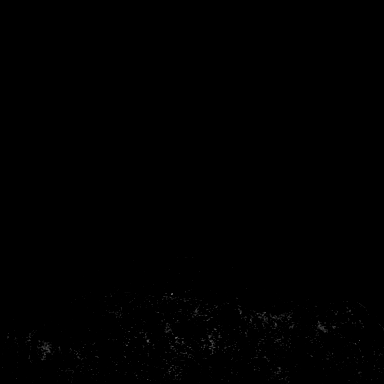
[im 36/144]
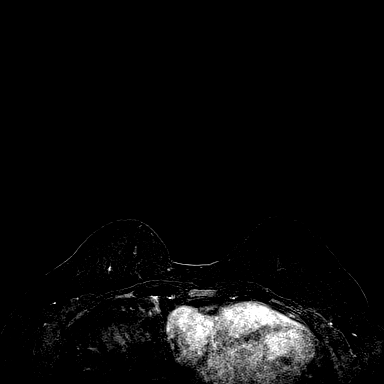
[im 72/144]
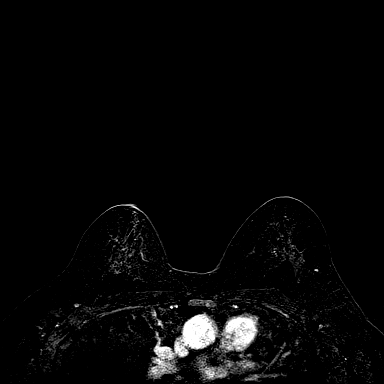
[im 108/144]
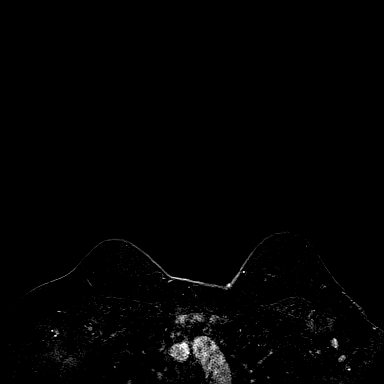
[im 144/144]
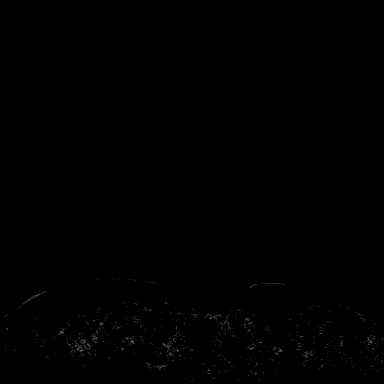

[Series 7: fl3d post-cm 20 · axial · 172.8mm · 0.94mm/px · 1 of 1 slices shown (3 of 3)]
[im 1/1]
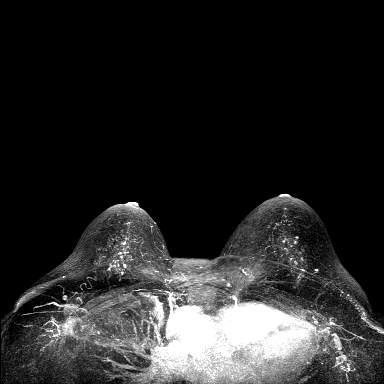

[Series 8: fl3d post-cm 3min · axial · 1.2mm · 0.94mm/px · z∈[-98,+74]mm · 6 of 144 slices shown]
[im 1/144]
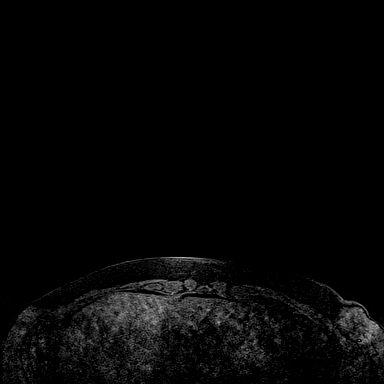
[im 29/144]
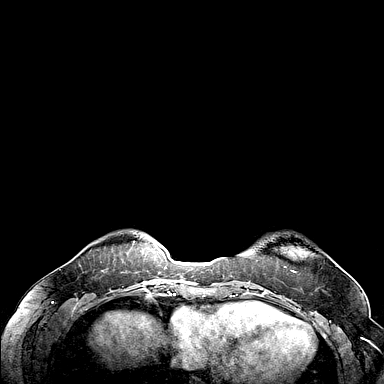
[im 58/144]
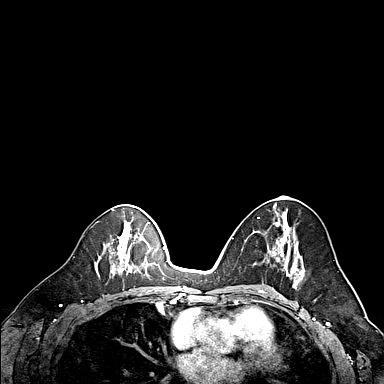
[im 86/144]
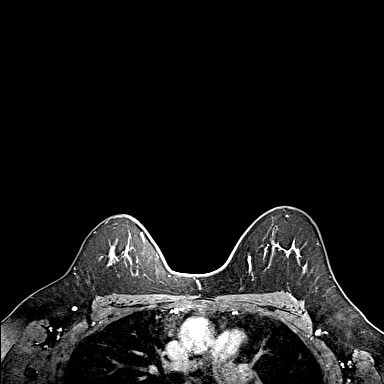
[im 115/144]
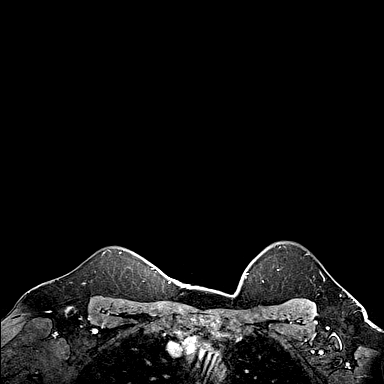
[im 144/144]
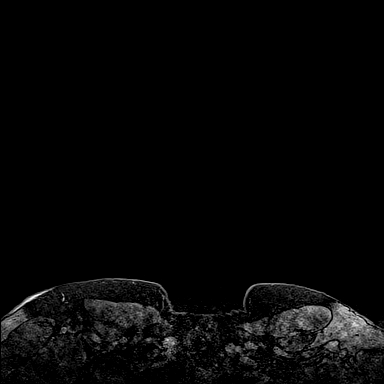

[Series 9: fl3d post-cm 3min_sub · axial · 1.2mm · 0.94mm/px · z∈[-98,+39]mm · 5 of 144 slices shown]
[im 1/144]
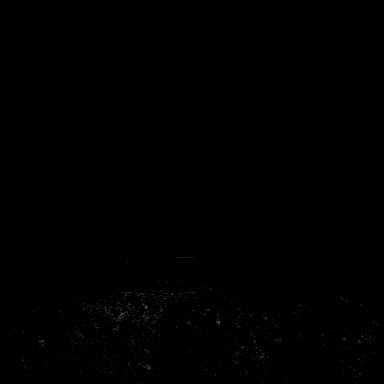
[im 29/144]
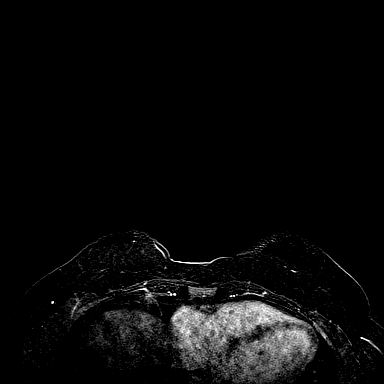
[im 58/144]
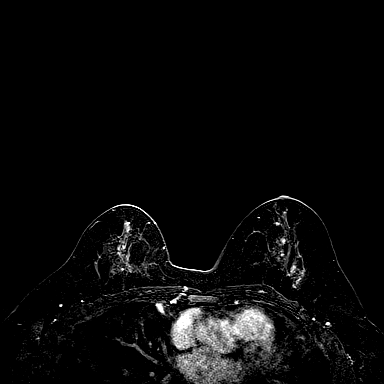
[im 86/144]
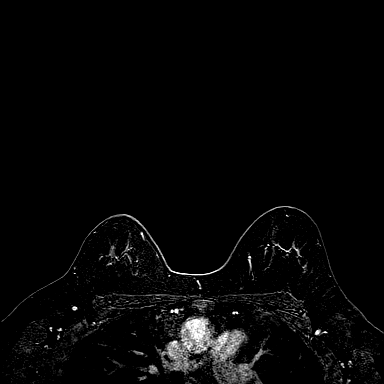
[im 115/144]
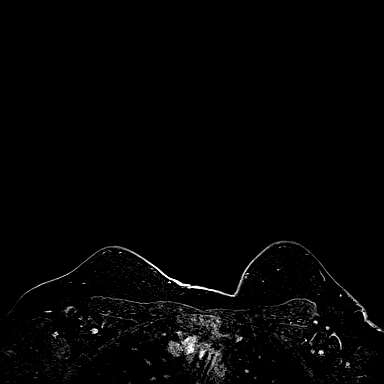

[33 of 48 positions shown; findings below may reference images not displayed]

Three-dimensional MR images were rendered by post-processing of the
original MR data on an independent workstation. The
three-dimensional MR images were interpreted, and findings are
reported in the following complete MRI report for this study. Three
dimensional images were evaluated at the independent [employer]
workstation
FINDINGS: Breast composition: c. Heterogeneous fibroglandular tissue.

Background parenchymal enhancement: Moderate.

Right breast: No mass or abnormal enhancement.

Left breast: No mass or abnormal enhancement.

Lymph nodes: No abnormal appearing lymph nodes.

Ancillary findings: 1.5 cm nonenhancing hepatic cyst incidentally
noted.
IMPRESSION: No MRI evidence of malignancy in either breast.

RECOMMENDATION:
Recommendation stands for a diagnostic right breast mammogram in
[DATE] as recommended on report dated [DATE].

BI-RADS CATEGORY  1: Negative.

## 2020-02-17 MED ORDER — GADOBUTROL 1 MMOL/ML IV SOLN
10.0000 mL | Freq: Once | INTRAVENOUS | Status: AC | PRN
  Administered 2020-02-17: 10 mL via INTRAVENOUS

## 2020-02-20 ENCOUNTER — Encounter: Payer: Self-pay | Admitting: Family Medicine

## 2020-02-20 LAB — HELICOBACTER PYLORI  SPECIAL ANTIGEN
MICRO NUMBER:: 10425989
RESULT:: DETECTED — AB
SPECIMEN QUALITY: ADEQUATE

## 2020-02-20 NOTE — Telephone Encounter (Signed)
Result note sent to patient with results and plan.

## 2020-02-20 NOTE — Addendum Note (Signed)
Addended by: Clarene Reamer B on: 02/20/2020 05:29 PM   Modules accepted: Orders

## 2020-02-21 ENCOUNTER — Encounter: Payer: Self-pay | Admitting: Nurse Practitioner

## 2020-03-02 ENCOUNTER — Other Ambulatory Visit: Payer: Self-pay

## 2020-03-02 ENCOUNTER — Ambulatory Visit: Payer: BC Managed Care – PPO | Admitting: Nurse Practitioner

## 2020-03-02 ENCOUNTER — Encounter: Payer: Self-pay | Admitting: Nurse Practitioner

## 2020-03-02 ENCOUNTER — Ambulatory Visit: Payer: BC Managed Care – PPO | Admitting: Family Medicine

## 2020-03-02 ENCOUNTER — Encounter: Payer: Self-pay | Admitting: Family Medicine

## 2020-03-02 ENCOUNTER — Other Ambulatory Visit (INDEPENDENT_AMBULATORY_CARE_PROVIDER_SITE_OTHER): Payer: BC Managed Care – PPO

## 2020-03-02 VITALS — BP 114/62 | HR 70 | Temp 98.4°F | Ht 63.0 in | Wt 189.8 lb

## 2020-03-02 VITALS — BP 110/60 | HR 72 | Temp 98.0°F | Ht 63.0 in | Wt 190.1 lb

## 2020-03-02 DIAGNOSIS — A048 Other specified bacterial intestinal infections: Secondary | ICD-10-CM

## 2020-03-02 DIAGNOSIS — M25551 Pain in right hip: Secondary | ICD-10-CM

## 2020-03-02 DIAGNOSIS — G8929 Other chronic pain: Secondary | ICD-10-CM

## 2020-03-02 DIAGNOSIS — M25511 Pain in right shoulder: Secondary | ICD-10-CM | POA: Diagnosis not present

## 2020-03-02 DIAGNOSIS — M545 Low back pain, unspecified: Secondary | ICD-10-CM

## 2020-03-02 DIAGNOSIS — K529 Noninfective gastroenteritis and colitis, unspecified: Secondary | ICD-10-CM

## 2020-03-02 LAB — COMPREHENSIVE METABOLIC PANEL
ALT: 19 U/L (ref 0–35)
AST: 17 U/L (ref 0–37)
Albumin: 4.5 g/dL (ref 3.5–5.2)
Alkaline Phosphatase: 65 U/L (ref 39–117)
BUN: 14 mg/dL (ref 6–23)
CO2: 28 mEq/L (ref 19–32)
Calcium: 9.3 mg/dL (ref 8.4–10.5)
Chloride: 102 mEq/L (ref 96–112)
Creatinine, Ser: 0.84 mg/dL (ref 0.40–1.20)
GFR: 72.54 mL/min (ref >60.00)
Glucose, Bld: 93 mg/dL (ref 70–99)
Potassium: 4.4 mEq/L (ref 3.5–5.1)
Sodium: 135 mEq/L (ref 135–145)
Total Bilirubin: 0.6 mg/dL (ref 0.2–1.2)
Total Protein: 7.2 g/dL (ref 6.0–8.3)

## 2020-03-02 LAB — CBC WITH DIFFERENTIAL/PLATELET
Basophils Absolute: 0 10*3/uL (ref 0.0–0.1)
Basophils Relative: 0.6 % (ref 0.0–3.0)
Eosinophils Absolute: 0.2 10*3/uL (ref 0.0–0.7)
Eosinophils Relative: 2.5 % (ref 0.0–5.0)
HCT: 42 % (ref 36.0–46.0)
Hemoglobin: 14.6 g/dL (ref 12.0–15.0)
Lymphocytes Relative: 24.5 % (ref 12.0–46.0)
Lymphs Abs: 1.6 10*3/uL (ref 0.7–4.0)
MCHC: 34.7 g/dL (ref 30.0–36.0)
MCV: 91 fl (ref 78.0–100.0)
Monocytes Absolute: 0.5 10*3/uL (ref 0.1–1.0)
Monocytes Relative: 6.8 % (ref 3.0–12.0)
Neutro Abs: 4.4 10*3/uL (ref 1.4–7.7)
Neutrophils Relative %: 65.6 % (ref 43.0–77.0)
Platelets: 186 10*3/uL (ref 150.0–400.0)
RBC: 4.61 Mil/uL (ref 3.87–5.11)
RDW: 12.4 % (ref 11.5–15.5)
WBC: 6.7 10*3/uL (ref 4.0–10.5)

## 2020-03-02 LAB — TSH: TSH: 1.76 u[IU]/mL (ref 0.35–4.50)

## 2020-03-02 LAB — C-REACTIVE PROTEIN: CRP: <1 mg/dL (ref 0.5–20.0)

## 2020-03-02 NOTE — Patient Instructions (Signed)
Good to see you today  Take ibuprofen 2- 3 tablets every 12 hours for 3-5 days or Alleve 2 tablets every 12 hours for 3-5 days- then as needed, check procedure instuctions, can always take Tylenol  If no improvement in 2-3 weeks, can make an appointment with Dr. Lorelei Pont  Back Exercises These exercises help to make your trunk and back strong. They also help to keep the lower back flexible. Doing these exercises can help to prevent back pain or lessen existing pain.  If you have back pain, try to do these exercises 2-3 times each day or as told by your doctor.  As you get better, do the exercises once each day. Repeat the exercises more often as told by your doctor.  To stop back pain from coming back, do the exercises once each day, or as told by your doctor. Exercises Single knee to chest Do these steps 3-5 times in a row for each leg: 1. Lie on your back on a firm bed or the floor with your legs stretched out. 2. Bring one knee to your chest. 3. Grab your knee or thigh with both hands and hold them it in place. 4. Pull on your knee until you feel a gentle stretch in your lower back or buttocks. 5. Keep doing the stretch for 10-30 seconds. 6. Slowly let go of your leg and straighten it. Pelvic tilt Do these steps 5-10 times in a row: 1. Lie on your back on a firm bed or the floor with your legs stretched out. 2. Bend your knees so they point up to the ceiling. Your feet should be flat on the floor. 3. Tighten your lower belly (abdomen) muscles to press your lower back against the floor. This will make your tailbone point up to the ceiling instead of pointing down to your feet or the floor. 4. Stay in this position for 5-10 seconds while you gently tighten your muscles and breathe evenly. Cat-cow Do these steps until your lower back bends more easily: 1. Get on your hands and knees on a firm surface. Keep your hands under your shoulders, and keep your knees under your hips. You may put  padding under your knees. 2. Let your head hang down toward your chest. Tighten (contract) the muscles in your belly. Point your tailbone toward the floor so your lower back becomes rounded like the back of a cat. 3. Stay in this position for 5 seconds. 4. Slowly lift your head. Let the muscles of your belly relax. Point your tailbone up toward the ceiling so your back forms a sagging arch like the back of a cow. 5. Stay in this position for 5 seconds.  Press-ups Do these steps 5-10 times in a row: 1. Lie on your belly (face-down) on the floor. 2. Place your hands near your head, about shoulder-width apart. 3. While you keep your back relaxed and keep your hips on the floor, slowly straighten your arms to raise the top half of your body and lift your shoulders. Do not use your back muscles. You may change where you place your hands in order to make yourself more comfortable. 4. Stay in this position for 5 seconds. 5. Slowly return to lying flat on the floor.  Bridges Do these steps 10 times in a row: 1. Lie on your back on a firm surface. 2. Bend your knees so they point up to the ceiling. Your feet should be flat on the floor. Your arms should be  flat at your sides, next to your body. 3. Tighten your butt muscles and lift your butt off the floor until your waist is almost as high as your knees. If you do not feel the muscles working in your butt and the back of your thighs, slide your feet 1-2 inches farther away from your butt. 4. Stay in this position for 3-5 seconds. 5. Slowly lower your butt to the floor, and let your butt muscles relax. If this exercise is too easy, try doing it with your arms crossed over your chest. Belly crunches Do these steps 5-10 times in a row: 1. Lie on your back on a firm bed or the floor with your legs stretched out. 2. Bend your knees so they point up to the ceiling. Your feet should be flat on the floor. 3. Cross your arms over your chest. 4. Tip your  chin a little bit toward your chest but do not bend your neck. 5. Tighten your belly muscles and slowly raise your chest just enough to lift your shoulder blades a tiny bit off of the floor. Avoid raising your body higher than that, because it can put too much stress on your low back. 6. Slowly lower your chest and your head to the floor. Back lifts Do these steps 5-10 times in a row: 1. Lie on your belly (face-down) with your arms at your sides, and rest your forehead on the floor. 2. Tighten the muscles in your legs and your butt. 3. Slowly lift your chest off of the floor while you keep your hips on the floor. Keep the back of your head in line with the curve in your back. Look at the floor while you do this. 4. Stay in this position for 3-5 seconds. 5. Slowly lower your chest and your face to the floor. Contact a doctor if:  Your back pain gets a lot worse when you do an exercise.  Your back pain does not get better 2 hours after you exercise. If you have any of these problems, stop doing the exercises. Do not do them again unless your doctor says it is okay. Get help right away if:  You have sudden, very bad back pain. If this happens, stop doing the exercises. Do not do them again unless your doctor says it is okay. This information is not intended to replace advice given to you by your health care provider. Make sure you discuss any questions you have with your health care provider. Document Revised: 07/01/2018 Document Reviewed: 07/01/2018 Elsevier Patient Education  2020 [employer].

## 2020-03-02 NOTE — Patient Instructions (Signed)
If you are age 48 or older, your body mass index should be between 23-30. Your Body mass index is 33.68 kg/m. If this is out of the aforementioned range listed, please consider follow up with your Primary Care Provider.  If you are age 58 or younger, your body mass index should be between 19-25. Your Body mass index is 33.68 kg/m. If this is out of the aformentioned range listed, please consider follow up with your Primary Care Provider.   Your provider has requested that you go to the basement level for lab work before leaving today. Press "B" on the elevator. The lab is located at the first door on the left as you exit the elevator.  1. Phillips Bacteria probiotic once daily 2. No dairy for 2 weeks 3. Benefiber 1 tbsp daily 4. Call office if loose stools worsen.  Due to recent changes in healthcare laws, you may see the results of your imaging and laboratory studies on MyChart before your provider has had a chance to review them.  We understand that in some cases there may be results that are confusing or concerning to you. Not all laboratory results come back in the same time frame and the provider may be waiting for multiple results in order to interpret others.  Please give [place] 48 hours in order for your provider to thoroughly review all the results before contacting the office for clarification of your results.   Thank you for choosing [facility] Noralyn Pick, CRNP

## 2020-03-02 NOTE — Progress Notes (Signed)
03/02/2020 Amy Marks [id number] Jun 01, 1971   CHIEF COMPLAINT:  H. Pylori infection   HISTORY OF PRESENT ILLNESS:  Amy Marks is a 49 year old female with a past medical history of anxiety, depression, OCD and H. Pylori. Past left oophorectomy, tubal ligation and tonsillectomy. She was referred to our office by her PCP Clarene Reamer for further evaluation regarding epigastric pain and abdominal bloat. H. Pylori stool antigen was positive 10/27/2019 treated with omeprazole, clarithromycin and amoxicillin for 14 days. A post treatment H. Pylori stool antigen on 12/22/2019 was positive. She was prescribed Levofloxacin x 14 days. A post treatment H. Pylori stool antigen on 02/17/2020 was positive.  She presents today for further GI evaluation regarding recurrent H. pylori unresponsive after 2 courses of antibiotics.  She reports initially being diagnosed with H. pylori initially approximately 2 years ago when she went to an urgent care center with chest pain.  She stated H. pylori blood testing was positive and she took a course of antibiotics at that time.  Further details are unclear.  She noted having a red raised rash to her lower abdomen and back at the time she was diagnosed with H. pylori and the rash abated when she completed the antibiotic course.  She developed a similar rash in January 2021 which led to her being tested for H. pylori which was positive, she received the antibiotic course as mentioned above and her rash resolved. Currently,  she complains of having upper abdominal bloat with epigastric discomfort which is more pronounced after eating.  No specific foods.  Increased burping.  No dysphagia or heartburn.  She reports having chronic loose stools since 2014.  She describes passing 3-5 mud-like stools most days.  She will pass a solid formed stool once every 2 to 3 months.  No rectal bleeding or melena.  No mucus per the rectum.  No lower abdominal pain.  He eats yogurt and cheese every  other day.  No prior obvious history of lactose intolerance.  No family history of IBD or colon cancer.  Infrequently takes Ibuprofen.  She is no longer taking a PPI.  She is having right shoulder and right hip pain which started 1 month ago and she questions if this could be related to taking levofloxacin.  She will follow-up with her PCP Nolon Bussing regarding her shoulder and hip pain.  He underwent a breast MRI which identified a small liver cyst, no further evaluation is required for this liver cyst.  EGD 02/14/13: Normal. Duodenal biopsies negative for celiac disease.  Colonoscopy 02/14/2013 Normal. Repeat colonoscopy in 10 years   CBC Latest Ref Rng & Units 03/02/2020 06/20/2019 08/28/2017  WBC 4.0 - 10.5 K/uL 6.7 8.5 8.3  Hemoglobin 12.0 - 15.0 g/dL 14.6 14.7 14.0  Hematocrit 36.0 - 46.0 % 42.0 43.0 42.1  Platelets 150.0 - 400.0 K/uL 186.0 207.0 200    CMP Latest Ref Rng & Units 03/02/2020 08/28/2017 06/27/2016  Glucose 70 - 99 mg/dL 93 67 90  BUN 6 - 23 mg/dL 14 13 13   Creatinine 0.40 - 1.20 mg/dL 0.84 0.89 0.85  Sodium 135 - 145 mEq/L 135 141 138  Potassium 3.5 - 5.1 mEq/L 4.4 4.9 5.0  Chloride 96 - 112 mEq/L 102 103 103  CO2 19 - 32 mEq/L 28 24 27   Calcium 8.4 - 10.5 mg/dL 9.3 9.3 9.7  Total Protein 6.0 - 8.3 g/dL 7.2 7.1 7.0  Total Bilirubin 0.2 - 1.2 mg/dL 0.6 0.5 0.5  Alkaline  Phos 39 - 117 U/L 65 70 60  AST 0 - 37 U/L 17 12 18   ALT 0 - 35 U/L 19 12 18    Past Medical History:  Diagnosis Date  . Anxiety   . Depression   . H. pylori infection 2018   treated with abx  . OCD (obsessive compulsive disorder)   . Seasonal allergies   . Social anxiety disorder 09/12/2013   Past Surgical History:  Procedure Laterality Date  . DILITATION & CURRETTAGE/HYSTROSCOPY WITH NOVASURE ABLATION  09/03/2012   Procedure: DILATATION & CURETTAGE/HYSTEROSCOPY WITH NOVASURE ABLATION;  Surgeon: Daria Pastures, MD;  Location: [facility];  Service: Gynecology;  Laterality: N/A;  . LAPAROSCOPIC  TUBAL LIGATION  09/03/2012   Procedure: LAPAROSCOPIC TUBAL LIGATION;  Surgeon: Daria Pastures, MD;  Location: [facility];  Service: Gynecology;  Laterality: Bilateral;  FILSHIE CLIPS  . LAPAROSCOPY  10/14/2012   Procedure: LAPAROSCOPY OPERATIVE with LSO and LOA;  Surgeon: Farrel Gobble. Harrington Challenger, MD;  Location: [facility];  Service: Gynecology;  Laterality: N/A;  . LAPAROTOMY  10/14/2012   Procedure: EXPLORATORY LAPAROTOMY;  Surgeon: Farrel Gobble. Harrington Challenger, MD;  Location: [facility];  Service: Gynecology;  Laterality: N/A;  . NASAL SEPTUM SURGERY    . OOPHORECTOMY  10/14/2012   Left ovary removed--hx "ruptured chocolate cyst"  . TONSILLECTOMY      Social History: Divorced.  She is a Surveyor, minerals. She smokes cigarettes 1/2 ppd off and on, consistent for the past 5 years. She drinks 6 beers monthly.  She drinks 3 cups of coffee daily.  No drug use.   Family History:   Mother 10 history of breast cancer, depression, rheumatoid arthritis and fibromyalgia. Sister, maternal grandmother and maternal great aunt with breast cancer. Father age 34 history of alcoholism.  No Known Allergies    Outpatient Encounter Medications as of 03/02/2020  Medication Sig  . fexofenadine (ALLEGRA) 60 MG tablet Take 60 mg 2 (two) times daily by mouth.  . fluticasone (FLONASE) 50 MCG/ACT nasal spray Place 2 sprays into both nostrils daily.  Marland Kitchen ibuprofen (ADVIL,MOTRIN) 800 MG tablet Take 800 mg by mouth every 8 (eight) hours as needed for pain.  Marland Kitchen omeprazole (PRILOSEC) 20 MG capsule Take 1 capsule (20 mg total) by mouth 2 (two) times daily before a meal for 14 days.  . propranolol (INDERAL) 10 MG tablet 1 - 2 tablets 30 minutes before social situation  . sertraline (ZOLOFT) 100 MG tablet Take 1 tablet (100 mg total) by mouth daily.   No facility-administered encounter medications on file as of 03/02/2020.     REVIEW OF SYSTEMS: All other systems reviewed and negative except where noted in the History of Present  Illness.   PHYSICAL EXAM: BP 110/60 (BP Location: Left Arm, Patient Position: Sitting, Cuff Size: Normal)   Pulse 72   Temp 98 F (36.7 C)   Ht 5\' 3"  (1.6 m) Comment: height measured without shoes  Wt 190 lb 2 oz (86.2 kg)   LMP 02/10/2020   BMI 33.68 kg/m   General: Well developed ... in no acute distress. Head: Normocephalic and atraumatic. Eyes:  Sclerae non-icteric, conjunctive pink. Ears: Normal auditory acuity. Mouth: Dentition intact. No ulcers or lesions.  Neck: Supple, no lymphadenopathy or thyromegaly.  Lungs: Clear bilaterally to auscultation without wheezes, crackles or rhonchi. Heart: Regular rate and rhythm. No murmur, rub or gallop appreciated.  Abdomen: Soft, nontender, non distended. No masses. No hepatosplenomegaly. Normoactive bowel sounds x 4 quadrants.  Rectal: Deferred. Musculoskeletal:  Symmetrical with no gross deformities. Skin: Warm and dry. No rash or lesions on visible extremities. Extremities: No edema. Neurological: Alert oriented x 4, no focal deficits.  Psychological:  Alert and cooperative. Normal mood and affect.  ASSESSMENT AND PLAN:  80.  49 year old female with H. pylori refractory to antibiotics x 3 courses.  Epigastricpain with abdominal bloat. -EGD benefits and risks discussed including risk with sedation, risk of bleeding, perforation and infection  -Stay off PPIs by her to EGD.  Tums as needed is okay.  2.  Colon cancer screening -Next colonoscopy due April 2024  3. Chronic loose stools.  -Consider diagnostic colonoscopy if her chronic loose stools worsen -No dairy for 2 weeks-and a fiber 1 tablespoon daily as tolerated -Bacteria probiotic once daily -Reduce coffee intake  4. Liver cyst seen on breast MRI -No further evaluation required         CC:  Elby Beck, FNP

## 2020-03-02 NOTE — Progress Notes (Signed)
Reviewed and agree with management plan.  Malcolm T. Stark, MD FACG [facility]  

## 2020-03-02 NOTE — Progress Notes (Signed)
   Subjective:    Patient ID: Amy Marks, female    DOB: Aug 15, 1971, 49 y.o.   MRN: [id number]  HPI Chief Complaint  Patient presents with  . Shoulder Pain    ongoing for a few months - worsening. Pt feels her hip might be sciatica. Pt does not recall injuring her shoulder.  . Hip Pain   Has recurrent h. Pylori, seeing GI, has EGD scheduled June 14.   Right shoulder pain worse at night x 1 month, pain with reaching back, pain along top of shoulder, ? Worse with push mowing. Hasn't taken anything for pain.  Does not recall any injury or overuse.  No numbness tingling or weakness.  Right hip pain off and on, low back, right side. Pain after sitting for awhile. Some discomfort every day but not constant. Has not taken anything. No numbness, tingling or weakness in leg.  No stretching.  Doing seated stair stepper in house.   Has upcoming appointment for EGD for recurrent H. pylori. Review of Systems Per HPI    Objective:   Physical Exam Vitals reviewed.  Constitutional:      Appearance: Normal appearance. She is obese.  HENT:     Head: Normocephalic and atraumatic.  Eyes:     Conjunctiva/sclera: Conjunctivae normal.  Cardiovascular:     Rate and Rhythm: Normal rate.  Pulmonary:     Effort: Pulmonary effort is normal.  Musculoskeletal:     Cervical back: Normal range of motion and neck supple.     Right lower leg: No edema.     Left lower leg: No edema.     Comments: UE/LE strength 5 out of 5.  Upper extremities, shoulders with full range of motion.  No tenderness to palpation no spinal tenderness.  No pain with straight leg raise.  Some decreased anterior flexion of right hip.  Normal gait.  Skin:    General: Skin is warm and dry.  Neurological:     Mental Status: She is alert.       BP 114/62 (BP Location: Left Arm, Patient Position: Sitting, Cuff Size: Normal)   Pulse 70   Temp 98.4 F (36.9 C) (Temporal)   Ht 5\' 3"  (1.6 m)   Wt 189 lb 12.8 oz (86.1 kg)   LMP  02/10/2020   SpO2 99%   BMI 33.62 kg/m  Wt Readings from Last 3 Encounters:  03/02/20 189 lb 12.8 oz (86.1 kg)  03/02/20 190 lb 2 oz (86.2 kg)  11/21/19 186 lb 12.8 oz (84.7 kg)       Assessment & Plan:  1. Acute pain of right shoulder -No worrisome signs symptoms on exam or history.  Discussed treating with over-the-counter NSAIDs, heat/ice, gentle range of motion  2. Right hip pain -The counter NSAIDs as per #1, also discussed exercises  3. Chronic right-sided low back pain without sciatica -Provided back exercises, written and verbal  -Follow-up with Dr. Lorelei Pont, sports medicine, if no improvement with above measures  This visit occurred during the SARS-CoV-2 public health emergency.  Safety protocols were in place, including screening questions prior to the visit, additional usage of staff PPE, and extensive cleaning of exam room while observing appropriate contact time as indicated for disinfecting solutions.      Clarene Reamer, FNP-BC  [facility]  03/03/2020 8:03 AM

## 2020-03-03 ENCOUNTER — Encounter: Payer: Self-pay | Admitting: Family Medicine

## 2020-03-16 DIAGNOSIS — F411 Generalized anxiety disorder: Secondary | ICD-10-CM | POA: Diagnosis not present

## 2020-03-23 DIAGNOSIS — F411 Generalized anxiety disorder: Secondary | ICD-10-CM | POA: Diagnosis not present

## 2020-03-27 ENCOUNTER — Encounter: Payer: Self-pay | Admitting: Gastroenterology

## 2020-03-29 DIAGNOSIS — F411 Generalized anxiety disorder: Secondary | ICD-10-CM | POA: Diagnosis not present

## 2020-04-04 ENCOUNTER — Encounter: Payer: Self-pay | Admitting: Certified Registered Nurse Anesthetist

## 2020-04-05 ENCOUNTER — Encounter: Payer: Self-pay | Admitting: Gastroenterology

## 2020-04-05 ENCOUNTER — Other Ambulatory Visit: Payer: Self-pay

## 2020-04-05 ENCOUNTER — Ambulatory Visit (AMBULATORY_SURGERY_CENTER): Payer: BC Managed Care – PPO | Admitting: Gastroenterology

## 2020-04-05 VITALS — BP 111/64 | HR 56 | Temp 97.8°F | Resp 14 | Ht 63.0 in | Wt 190.0 lb

## 2020-04-05 DIAGNOSIS — K2970 Gastritis, unspecified, without bleeding: Secondary | ICD-10-CM

## 2020-04-05 DIAGNOSIS — K2950 Unspecified chronic gastritis without bleeding: Secondary | ICD-10-CM | POA: Diagnosis not present

## 2020-04-05 DIAGNOSIS — B9681 Helicobacter pylori [H. pylori] as the cause of diseases classified elsewhere: Secondary | ICD-10-CM | POA: Diagnosis not present

## 2020-04-05 DIAGNOSIS — A048 Other specified bacterial intestinal infections: Secondary | ICD-10-CM | POA: Diagnosis not present

## 2020-04-05 DIAGNOSIS — R1013 Epigastric pain: Secondary | ICD-10-CM

## 2020-04-05 MED ORDER — SODIUM CHLORIDE 0.9 % IV SOLN
500.0000 mL | Freq: Once | INTRAVENOUS | Status: DC
Start: 2020-04-05 — End: 2020-04-05

## 2020-04-05 NOTE — Op Note (Addendum)
[facility] Patient Name: Amy Marks Procedure Date: 04/05/2020 2:04 PM MRN: [id number] Endoscopist: Ladene Artist , MD Age: 49 Referring MD:  Date of Birth: 09/17/1971 Gender: Female Account #: [id number] Procedure:                Upper GI endoscopy Indications:              Epigastric abdominal pain, Exclusion of                            Helicobacter pylori Medicines:                Monitored Anesthesia Care Procedure:                Pre-Anesthesia Assessment:                           - Prior to the procedure, a History and Physical                            was performed, and patient medications and                            allergies were reviewed. The patient's tolerance of                            previous anesthesia was also reviewed. The risks                            and benefits of the procedure and the sedation                            options and risks were discussed with the patient.                            All questions were answered, and informed consent                            was obtained. Prior Anticoagulants: The patient has                            taken no previous anticoagulant or antiplatelet                            agents. ASA Grade Assessment: II - A patient with                            mild systemic disease. After reviewing the risks                            and benefits, the patient was deemed in                            satisfactory condition to undergo the procedure.  After obtaining informed consent, the endoscope was                            passed under direct vision. Throughout the                            procedure, the patient's blood pressure, pulse, and                            oxygen saturations were monitored continuously. The                            Endoscope was introduced through the mouth, and                            advanced to the second part of duodenum. The  upper                            GI endoscopy was accomplished without difficulty.                            The patient tolerated the procedure well. Scope In: Scope Out: Findings:                 The examined esophagus was normal.                           Diffuse mild inflammation characterized by                            erythema, friability and granularity was found in                            the gastric body and fundus. Biopsies were taken                            with a cold forceps for histology.                           The exam of the stomach was otherwise normal.                           Patchy mildly erythematous mucosa without active                            bleeding and with no stigmata of bleeding was found                            in the duodenal bulb.                           The exam of the duodenum was otherwise normal. Complications:            No immediate complications. Estimated Blood Loss:     Estimated blood loss was minimal. Impression:               -  Normal esophagus.                           - Gastritis. Biopsied.                           - Mild duodenal erythema. Recommendation:           - Patient has a contact number available for                            emergencies. The signs and symptoms of potential                            delayed complications were discussed with the                            patient. Return to normal activities tomorrow.                            Written discharge instructions were provided to the                            patient.                           - Resume previous diet.                           - Continue present medications.                           - Omeprazole 20 mg po qd.                           - Await pathology results. Ladene Artist, MD 04/05/2020 2:27:41 PM This report has been signed electronically.

## 2020-04-05 NOTE — Progress Notes (Signed)
Report given to PACU, vss 

## 2020-04-05 NOTE — Patient Instructions (Signed)
Handout on gastritis given. Continue Omeprazole daily.    YOU HAD AN ENDOSCOPIC PROCEDURE TODAY AT [facility]:   Refer to the procedure report that was given to you for any specific questions about what was found during the examination.  If the procedure report does not answer your questions, please call your gastroenterologist to clarify.  If you requested that your care partner not be given the details of your procedure findings, then the procedure report has been included in a sealed envelope for you to review at your convenience later.  YOU SHOULD EXPECT: Some feelings of bloating in the abdomen. Passage of more gas than usual.  Walking can help get rid of the air that was put into your GI tract during the procedure and reduce the bloating. If you had a lower endoscopy (such as a colonoscopy or flexible sigmoidoscopy) you may notice spotting of blood in your stool or on the toilet paper. If you underwent a bowel prep for your procedure, you may not have a normal bowel movement for a few days.  Please Note:  You might notice some irritation and congestion in your nose or some drainage.  This is from the oxygen used during your procedure.  There is no need for concern and it should clear up in a day or so.  SYMPTOMS TO REPORT IMMEDIATELY:    Following upper endoscopy (EGD)  Vomiting of blood or coffee ground material  New chest pain or pain under the shoulder blades  Painful or persistently difficult swallowing  New shortness of breath  Fever of 100F or higher  Black, tarry-looking stools  For urgent or emergent issues, a gastroenterologist can be reached at any hour by calling [phone]. Do not use MyChart messaging for urgent concerns.    DIET:  We do recommend a small meal at first, but then you may proceed to your regular diet.  Drink plenty of fluids but you should avoid alcoholic beverages for 24 hours.  ACTIVITY:  You should plan to take it easy for the  rest of today and you should NOT DRIVE or use heavy machinery until tomorrow (because of the sedation medicines used during the test).    FOLLOW UP: Our staff will call the number listed on your records 48-72 hours following your procedure to check on you and address any questions or concerns that you may have regarding the information given to you following your procedure. If we do not reach you, we will leave a message.  We will attempt to reach you two times.  During this call, we will ask if you have developed any symptoms of COVID 19. If you develop any symptoms (ie: fever, flu-like symptoms, shortness of breath, cough etc.) before then, please call [phone].  If you test positive for Covid 19 in the 2 weeks post procedure, please call and report this information to [place].    If any biopsies were taken you will be contacted by phone or by letter within the next 1-3 weeks.  Please call us at [phone] if you have not heard about the biopsies in 3 weeks.    SIGNATURES/CONFIDENTIALITY: You and/or your care partner have signed paperwork which will be entered into your electronic medical record.  These signatures attest to the fact that that the information above on your After Visit Summary has been reviewed and is understood.  Full responsibility of the confidentiality of this discharge information lies with you and/or your care-partner.

## 2020-04-05 NOTE — Progress Notes (Signed)
VS by CW  Pt's states no medical or surgical changes since previsit or office visit.  

## 2020-04-09 ENCOUNTER — Telehealth: Payer: Self-pay

## 2020-04-09 NOTE — Telephone Encounter (Signed)
  Follow up Call-  Call back number 04/05/2020  Post procedure Call Back phone  # [phone]  Permission to leave phone message Yes  Some recent data might be hidden     Patient questions:  Do you have a fever, pain , or abdominal swelling? No. Pain Score  0 *  Have you tolerated food without any problems? Yes.    Have you been able to return to your normal activities? Yes.    Do you have any questions about your discharge instructions: Diet   No. Medications  No. Follow up visit  No.  Do you have questions or concerns about your Care? No.  Actions: * If pain score is 4 or above: No action needed, pain <4.  1. Have you developed a fever since your procedure? no  2.   Have you had an respiratory symptoms (SOB or cough) since your procedure? no  3.   Have you tested positive for COVID 19 since your procedure no  4.   Have you had any family members/close contacts diagnosed with the COVID 19 since your procedure?  no   If yes to any of these questions please route to Joylene John, RN and Erenest Rasher, RN

## 2020-04-10 ENCOUNTER — Encounter: Payer: Self-pay | Admitting: Family Medicine

## 2020-04-13 ENCOUNTER — Other Ambulatory Visit: Payer: Self-pay

## 2020-04-13 ENCOUNTER — Telehealth: Payer: Self-pay | Admitting: Gastroenterology

## 2020-04-13 DIAGNOSIS — F411 Generalized anxiety disorder: Secondary | ICD-10-CM | POA: Diagnosis not present

## 2020-04-13 DIAGNOSIS — A048 Other specified bacterial intestinal infections: Secondary | ICD-10-CM

## 2020-04-13 NOTE — Telephone Encounter (Signed)
Spoke with patient, patient advised that once Dr. Fuller Plan reviewed pathology report that we would be calling her with results, pt advised that Dr. Fuller Plan is performing procedures today and it may be Monday before she heard anything back.

## 2020-04-16 ENCOUNTER — Other Ambulatory Visit: Payer: Self-pay

## 2020-04-16 ENCOUNTER — Encounter: Payer: Self-pay | Admitting: Family Medicine

## 2020-04-16 ENCOUNTER — Ambulatory Visit (INDEPENDENT_AMBULATORY_CARE_PROVIDER_SITE_OTHER): Payer: BC Managed Care – PPO | Admitting: Family Medicine

## 2020-04-16 VITALS — BP 118/69 | HR 78 | Temp 98.0°F | Ht 63.0 in | Wt 189.0 lb

## 2020-04-16 DIAGNOSIS — M7501 Adhesive capsulitis of right shoulder: Secondary | ICD-10-CM | POA: Diagnosis not present

## 2020-04-16 MED ORDER — METHYLPREDNISOLONE ACETATE 40 MG/ML IJ SUSP
80.0000 mg | Freq: Once | INTRAMUSCULAR | Status: AC
  Administered 2020-04-16: 80 mg via INTRA_ARTICULAR

## 2020-04-16 NOTE — Addendum Note (Signed)
Addended by: Pete Pelt on: 04/16/2020 04:37 PM   Modules accepted: Orders

## 2020-04-16 NOTE — Progress Notes (Signed)
Danaka Llera T. Armin Yerger, MD, [place]  Primary Care and Sports Medicine [facility] [place], [address]  Phone: [phone]  FAX: [phone]  Amy Marks - 49 y.o. female  MRN [id number]  Date of Birth: October 25, 1970  Date: 04/16/2020  PCP: Elby Beck, FNP  Referral: Elby Beck, FNP  Chief Complaint  Patient presents with  . Shoulder Pain    right, for over a month    This visit occurred during the SARS-CoV-2 public health emergency.  Safety protocols were in place, including screening questions prior to the visit, additional usage of staff PPE, and extensive cleaning of exam room while observing appropriate contact time as indicated for disinfecting solutions.   Subjective:   Amy Marks is a 49 y.o. very pleasant female patient who presents with the following: shoulder pain  The patient noted above presents with shoulder pain that has been ongoing for 1 month there is no history of trauma or accident. The patient denies neck pain or radicular symptoms. No shoulder blade pain Denies dislocation, subluxation, separation of the shoulder. The patient does complain of pain with flexion, abduction, and terminal motion.  Significant restriction of motion. she describes a deep ache around the shoulder, and sometimes it will wake the patient up at night.  1 month R sided shoulder pain:   Pain with loss of motion and really bad pain Pain with sleeping.  No injury No problems at work Arm propped up at work.    Medications Tried: Over-the-counter NSAIDs and Tylenol Ice or Heat: minimal help Tried PT: No  Prior shoulder Injury: No Prior surgery: No Prior fracture: No   Review of Systems is noted in the HPI, as appropriate  Objective:   Blood pressure 118/69, pulse 78, temperature 98 F (36.7 C), temperature source Skin, height 5\' 3"  (1.6 m), weight 189 lb (85.7 kg), last menstrual period  04/10/2020, SpO2 98 %.   GEN: No acute distress; alert,appropriate. PULM: Breathing comfortably in no respiratory distress PSYCH: Normally interactive.   Shoulder: R and L Inspection: No muscle wasting or winging Ecchymosis/edema: neg  AC joint, scapula, clavicle: NT Cervical spine: NT, full ROM Spurling's: neg ABNORMAL SIDE TESTED: R UNLESS OTHERWISE NOTED, THE CONTRALATERAL SIDE HAS FULL RANGE OF MOTION. Abduction: 5/5, LIMITED TO 105 DEGREES Flexion: 5/5, LIMITED TO 105 DEGNO ROM  IR, lift-off: 5/5. TESTED AT 90 DEGREES OF ABDUCTION, LIMITED TO 0 DEGREES ER at neutral:  5/5, TESTED AT 90 DEGREES OF ABDUCTION, LIMITED TO 35 DEGREES AC crossover and compression: PAIN Drop Test: neg Empty Can: neg Supraspinatus insertion: NT Bicipital groove: NT ALL OTHER SPECIAL TESTING EQUIVOCAL GIVEN LOSS OF MOTION C5-T1 intact Sensation intact Grip 5/5   Assessment and Plan:     ICD-10-CM   1. Adhesive capsulitis of right shoulder  M75.01     Level of Medical Decision-Making in this case is MODERATE.  Patient was given a systematic ROM protocol from [employer] to be done daily. Emphasized importance of adherence, help of PT, daily HEP.  The average length of total symptoms is 12-18 months going through 3 different phases in the freezing and thawing process. Reviewed all with patient.  Tylenol or NSAID of choice prn for pain relief Intraarticular shoulder injections discussed with patient, which have good evidence for accelerating the thawing phase.  I appreciate the opportunity to evaluate this very friendly patient. If you have any question regarding her care or prognosis, do  not hesitate to ask.  Intraarticular Shoulder Aspiration/Injection Procedure Note Amy Marks 11/19/1970 Date of procedure: 04/16/2020  Procedure: Large Joint Aspiration / Injection of Shoulder, Intraarticular, R Indications: Pain  Procedure Details Verbal consent was obtained from the patient. Risks  including infection explained and contrasted with benefits and alternatives. Patient prepped with Chloraprep and Ethyl Chloride used for anesthesia. An intraarticular shoulder injection was performed using the posterior approach; needle placed into joint capsule without difficulty. The patient tolerated the procedure well and had decreased pain post injection. No complications. Injection: 8 cc of Lidocaine 1% and 2 mL Depo-Medrol 40 mg. Needle: 21 gauge, 2 inch   Follow-up: Return in about 2 months (around 06/16/2020).  Signed,  Maud Deed. Edwardine Deschepper, MD   Outpatient Encounter Medications as of 04/16/2020  Medication Sig  . fluticasone (FLONASE) 50 MCG/ACT nasal spray Place 2 sprays into both nostrils daily. (Patient taking differently: Place 2 sprays into both nostrils as needed. )  . ibuprofen (ADVIL,MOTRIN) 800 MG tablet Take 800 mg by mouth as needed.   Marland Kitchen omeprazole (PRILOSEC) 20 MG capsule Take 1 capsule (20 mg total) by mouth 2 (two) times daily before a meal for 14 days.  . propranolol (INDERAL) 10 MG tablet 1 - 2 tablets 30 minutes before social situation  . sertraline (ZOLOFT) 100 MG tablet Take 1 tablet (100 mg total) by mouth daily.  . fexofenadine (ALLEGRA) 60 MG tablet Take 60 mg by mouth daily.    No facility-administered encounter medications on file as of 04/16/2020.

## 2020-04-25 ENCOUNTER — Encounter: Payer: Self-pay | Admitting: Internal Medicine

## 2020-04-25 ENCOUNTER — Ambulatory Visit: Payer: BC Managed Care – PPO | Admitting: Internal Medicine

## 2020-04-25 ENCOUNTER — Other Ambulatory Visit: Payer: Self-pay

## 2020-04-25 DIAGNOSIS — A048 Other specified bacterial intestinal infections: Secondary | ICD-10-CM | POA: Diagnosis not present

## 2020-04-25 DIAGNOSIS — K2950 Unspecified chronic gastritis without bleeding: Secondary | ICD-10-CM

## 2020-04-25 DIAGNOSIS — K2970 Gastritis, unspecified, without bleeding: Secondary | ICD-10-CM | POA: Insufficient documentation

## 2020-04-25 MED ORDER — BIS SUBCIT-METRONID-TETRACYC 140-125-125 MG PO CAPS: 3.0000 | ORAL_CAPSULE | Freq: Three times a day (TID) | ORAL | 0 refills | Status: DC

## 2020-04-25 MED ORDER — ESOMEPRAZOLE MAGNESIUM 20 MG PO CPDR: 20.0000 mg | DELAYED_RELEASE_CAPSULE | Freq: Two times a day (BID) | ORAL | 0 refills | Status: DC

## 2020-04-25 NOTE — Progress Notes (Signed)
[facility] for Infectious Disease  Reason for Consult: Persistent H. pylori infection Referring Provider: Nolon Bussing, FNP  Assessment: She has relapsed H. pylori infection and has failed 2 recent rounds of treatment.  I will treat her with a bismuth quadruple regimen including a high potency PPI.  She is in agreement with that plan.  I talked to her about the interaction between alcohol and metronidazole.  She says she only drinks an occasional beer and has no problems abstaining from alcohol while she is on this treatment regimen.  Plan: 1. Pylera (bismuth, tetracycline and metronidazole) 3 capsules 4 times daily for 10 days 2. Esomeprazole 20 mg twice daily for 10 days 3. Follow-up for repeat stool antigen testing in 6 weeks  Patient Active Problem List   Diagnosis Date Noted  . Gastritis 04/25/2020    Priority: High  . H. pylori infection 03/02/2020    Priority: High  . Tobacco abuse 06/20/2019  . Anxiety and depression 06/20/2019  . Decreased hearing, left 06/09/2019  . Increased risk of breast cancer 09/24/2018  . Rash 06/19/2017  . Obesity (BMI 30.0-34.9) 12/08/2014  . Social anxiety disorder 09/12/2013  . Major depressive disorder, recurrent ([place]) 06/27/2013  . Carpal tunnel syndrome, bilateral 12/01/2011  . ANXIETY STATE, UNSPECIFIED 01/02/2011  . DISORDERS, OBSESSIVE-COMPULSIVE 03/26/2007    Patient's Medications  New Prescriptions   BISMUTH-METRONIDAZOLE-TETRACYCLINE (PYLERA) 140-125-125 MG CAPSULE    Take 3 capsules by mouth 4 (four) times daily -  before meals and at bedtime.   ESOMEPRAZOLE (NEXIUM) 20 MG CAPSULE    Take 1 capsule (20 mg total) by mouth 2 (two) times daily before a meal.  Previous Medications   FEXOFENADINE (ALLEGRA) 60 MG TABLET    Take 60 mg by mouth daily.    FLUTICASONE (FLONASE) 50 MCG/ACT NASAL SPRAY    Place 2 sprays into both nostrils daily.   IBUPROFEN (ADVIL,MOTRIN) 800 MG TABLET    Take 800 mg by mouth as needed.      OMEPRAZOLE (PRILOSEC) 20 MG CAPSULE    Take 1 capsule (20 mg total) by mouth 2 (two) times daily before a meal for 14 days.   PROPRANOLOL (INDERAL) 10 MG TABLET    1 - 2 tablets 30 minutes before social situation   SERTRALINE (ZOLOFT) 100 MG TABLET    Take 1 tablet (100 mg total) by mouth daily.  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  She was seen at a local urgent care.  She recalls that blood work revealed that she had H. pylori infection.  She recalls being treated with 3 medications.  She believes one was amoxicillin and a second was Zantac.  She cannot recall the third drug.  She had a very mild rash on her back that resolved after she completed therapy.  Her symptoms went away completely.  Her symptoms recurred in early January of this year.  Her stool was positive for H. pylori antigen.  She was treated with a 2-week course of omeprazole, clarithromycin, amoxicillin and Pepto-Bismol.  She recalls having a rash on her lower abdomen before she started those medications.  The rash went away after she started the medications.  Her symptoms improved but repeat H. pylori antigen was positive again on 12/22/2019.  It  appears she was treated with omeprazole, amoxicillin, levofloxacin and Pepto-Bismol.  Stool antigen testing was positive again on 02/17/2020.  She underwent EGD on 04/05/2020 which showed gastritis and mild duodenal erythema.  There was no bleeding.  [place]-Starry silver stain was positive for H. pylori.  Review of Systems: Review of Systems  Constitutional: Negative for chills, diaphoresis, fever and weight loss.  Respiratory: Negative for cough and shortness of breath.   Cardiovascular: Negative for chest pain.  Gastrointestinal: Positive for abdominal pain and heartburn. Negative for nausea  and vomiting.       Bloating.      Past Medical History:  Diagnosis Date  . Anxiety   . Depression   . H. pylori infection 2018   treated with abx  . IBS (irritable bowel syndrome)   . OCD (obsessive compulsive disorder)   . Seasonal allergies   . Social anxiety disorder 09/12/2013    Social History   Tobacco Use  . Smoking status: Current Every Day Smoker    Packs/day: 0.50    Types: Cigarettes  . Smokeless tobacco: Never Used  . Tobacco comment: started smoking again 2015  Vaping Use  . Vaping Use: Never used  Substance Use Topics  . Alcohol use: Yes    Comment: 0-1 per per day, occasional  . Drug use: No    Family History  Problem Relation Age of Onset  . Depression Mother   . Fibromyalgia Mother   . Breast cancer Mother 32  . Rheum arthritis Mother   . Alcohol abuse Father   . Emphysema Maternal Grandmother        Dec COPD  . Breast cancer Maternal Grandmother 80  . Multiple sclerosis Maternal Grandfather   . Breast cancer Sister 6  . Breast cancer Paternal Aunt 42       A & W, maternal great aunt  . Crohn's disease Other        maternal gerat aunt  . Rectal cancer Other        maternal third cousin   . Colon cancer Neg Hx   . Esophageal cancer Neg Hx   . Stomach cancer Neg Hx    No Known Allergies  OBJECTIVE: Vitals:   04/25/20 1458  BP: 112/77  Pulse: 71  Temp: 98.7 F (37.1 C)  TempSrc: Oral  Weight: 194 lb (88 kg)   Body mass index is 34.37 kg/m.   Physical Exam Constitutional:      Comments: She is in good spirits.  Cardiovascular:     Rate and Rhythm: Normal rate and regular rhythm.     Heart sounds: No murmur heard.   Pulmonary:     Effort: Pulmonary effort is normal.     Breath sounds: Normal breath sounds.  Abdominal:     General: There is no distension.     Palpations: Abdomen is soft.     Tenderness: There is no abdominal tenderness.  Musculoskeletal:        General: Tenderness present. No swelling.     Comments:  She commented on some right shoulder pain during the exam.  Skin:    Findings: No rash.  Neurological:     General: No focal deficit present.  Psychiatric:        Mood and Affect: Mood normal.     Microbiology: No results found for this or any previous visit (from the past 240 hour(s)).  Michel Bickers, MD [facility] for [facility] [phone] pager  [phone] cell 04/25/2020, 3:29 PM

## 2020-04-27 ENCOUNTER — Telehealth: Payer: Self-pay

## 2020-04-27 DIAGNOSIS — F411 Generalized anxiety disorder: Secondary | ICD-10-CM | POA: Diagnosis not present

## 2020-04-27 NOTE — Telephone Encounter (Signed)
Patient called office today regarding medication coverage concerns. States pharmacy called this Wednesday stating insurance will not cover medication send by MD. Stated pharmacy would call us with this information.  Office did receive PA via fax. Office will initiate and keep patient updated. [place]

## 2020-04-30 NOTE — Telephone Encounter (Signed)
PA department  ([employer] [phone] opt. 3, opt 3) Medication approved via cover my meds. Pharmacy made aware.

## 2020-05-25 DIAGNOSIS — F411 Generalized anxiety disorder: Secondary | ICD-10-CM | POA: Diagnosis not present

## 2020-06-07 ENCOUNTER — Ambulatory Visit: Payer: BC Managed Care – PPO | Admitting: Internal Medicine

## 2020-06-07 ENCOUNTER — Encounter: Payer: Self-pay | Admitting: Internal Medicine

## 2020-06-07 ENCOUNTER — Other Ambulatory Visit: Payer: Self-pay

## 2020-06-07 DIAGNOSIS — A048 Other specified bacterial intestinal infections: Secondary | ICD-10-CM

## 2020-06-07 NOTE — Progress Notes (Signed)
[name] for Infectious Disease  Patient Active Problem List   Diagnosis Date Noted  . Gastritis 04/25/2020    Priority: High  . H. pylori infection 03/02/2020    Priority: High  . Tobacco abuse 06/20/2019  . Anxiety and depression 06/20/2019  . Decreased hearing, left 06/09/2019  . Increased risk of breast cancer 09/24/2018  . Rash 06/19/2017  . Obesity (BMI 30.0-34.9) 12/08/2014  . Social anxiety disorder 09/12/2013  . Major depressive disorder, recurrent ([place]) 06/27/2013  . Carpal tunnel syndrome, bilateral 12/01/2011  . ANXIETY STATE, UNSPECIFIED 01/02/2011  . DISORDERS, OBSESSIVE-COMPULSIVE 03/26/2007    Patient's Medications  New Prescriptions   No medications on file  Previous Medications   BISMUTH-METRONIDAZOLE-TETRACYCLINE (PYLERA) 140-125-125 MG CAPSULE    Take 3 capsules by mouth 4 (four) times daily -  before meals and at bedtime.   ESOMEPRAZOLE (NEXIUM) 20 MG CAPSULE    Take 1 capsule (20 mg total) by mouth 2 (two) times daily before a meal.   FEXOFENADINE (ALLEGRA) 60 MG TABLET    Take 60 mg by mouth daily.    FLUTICASONE (FLONASE) 50 MCG/ACT NASAL SPRAY    Place 2 sprays into both nostrils daily.   IBUPROFEN (ADVIL,MOTRIN) 800 MG TABLET    Take 800 mg by mouth as needed.    OMEPRAZOLE (PRILOSEC) 20 MG CAPSULE    Take 1 capsule (20 mg total) by mouth 2 (two) times daily before a meal for 14 days.   PROPRANOLOL (INDERAL) 10 MG TABLET    1 - 2 tablets 30 minutes before social situation   SERTRALINE (ZOLOFT) 100 MG TABLET    Take 1 tablet (100 mg total) by mouth daily.  Modified Medications   No medications on file  Discontinued Medications   No medications on file    Subjective: Amy Marks is in for Amy Marks routine follow-up visit.  Amy Marks developed acid indigestion and abdominal bloating about 2 years ago.  Amy Marks was seen at a local urgent care.  Amy Marks recalls that blood work revealed that Amy Marks had H. pylori infection.  Amy Marks recalls being treated with 3  medications.  Amy Marks believes one was amoxicillin and a second was Zantac.  Amy Marks cannot recall the third drug.  Amy Marks had a very mild rash on Amy Marks back that resolved after Amy Marks completed therapy.  Amy Marks symptoms went away completely.  Amy Marks symptoms recurred in early January of this year.  Amy Marks stool was positive for H. pylori antigen.  Amy Marks was treated with a 2-week course of omeprazole, clarithromycin, amoxicillin and Pepto-Bismol.  Amy Marks recalls having a rash on Amy Marks lower abdomen before Amy Marks started those medications.  The rash went away after Amy Marks started the medications.  Amy Marks symptoms improved but repeat H. pylori antigen was positive again on 12/22/2019.  It appears Amy Marks was treated with omeprazole, amoxicillin, levofloxacin and Pepto-Bismol.  Stool antigen testing was positive again on 02/17/2020.  Amy Marks underwent EGD on 04/05/2020 which showed gastritis and mild duodenal erythema.  There was no bleeding.  Warthin-Starry silver stain was positive for H. pylori.   At the time of Amy Marks initial visit with me I decided to start Amy Marks on a 4 drug regimen bismuth, tetracycline, metronidazole and Nexium.  Overall Amy Marks is feeling better.  Amy Marks still has occasional burping.  Amy Marks had 1 episode of transient right upper quadrant pain after completing Amy Marks therapy.  It resolved spontaneously.  Amy Marks has been told that Amy Marks has IBS.  There has been no change  in Amy Marks pattern of chronic nausea and diarrhea.  Amy Marks is in for test of cure.  Review of Systems: Review of Systems  Gastrointestinal: Positive for abdominal pain, diarrhea and nausea. Negative for heartburn and vomiting.    Past Medical History:  Diagnosis Date  . Anxiety   . Depression   . H. pylori infection 2018   treated with abx  . IBS (irritable bowel syndrome)   . OCD (obsessive compulsive disorder)   . Seasonal allergies   . Social anxiety disorder 09/12/2013    Social History   Tobacco Use  . Smoking status: Current Every Day Smoker    Packs/day: 0.50    Types:  Cigarettes  . Smokeless tobacco: Never Used  . Tobacco comment: started smoking again 2015  Vaping Use  . Vaping Use: Never used  Substance Use Topics  . Alcohol use: Yes    Comment: 0-1 per per day, occasional  . Drug use: No    Family History  Problem Relation Age of Onset  . Depression Mother   . Fibromyalgia Mother   . Breast cancer Mother 46  . Rheum arthritis Mother   . Alcohol abuse Father   . Emphysema Maternal Grandmother        Dec COPD  . Breast cancer Maternal Grandmother 69  . Multiple sclerosis Maternal Grandfather   . Breast cancer Sister 34  . Breast cancer Paternal Aunt 83       A & W, maternal great aunt  . Crohn's disease Other        maternal gerat aunt  . Rectal cancer Other        maternal third cousin   . Colon cancer Neg Hx   . Esophageal cancer Neg Hx   . Stomach cancer Neg Hx     No Known Allergies  Objective: Vitals:   06/07/20 0913  BP: 119/79  Pulse: 76  Temp: 98.2 F (36.8 C)  TempSrc: Oral  Weight: 188 lb (85.3 kg)   Body mass index is 33.3 kg/m.  Physical Exam Constitutional:      Comments: Amy Marks is in good spirits.  Abdominal:     General: There is no distension.     Palpations: Abdomen is soft.     Tenderness: There is no abdominal tenderness.  Psychiatric:        Mood and Affect: Mood normal.       Problem List Items Addressed This Visit      High   H. pylori infection    I am hopeful that Amy Marks latest salvage regimen has cured Amy Marks H. pylori.  I will do a H. pylori breath test today and call Amy Marks with the results.      Relevant Orders   H. pylori breath test       Michel Bickers, MD [facility] for Infectious [place] [phone] pager   [phone] cell 06/07/2020, 9:27 AM

## 2020-06-07 NOTE — Assessment & Plan Note (Signed)
I am hopeful that her latest salvage regimen has cured her H. pylori.  I will do a H. pylori breath test today and call her with the results.

## 2020-06-08 LAB — H. PYLORI BREATH TEST: H. pylori Breath Test: DETECTED — AB

## 2020-06-11 ENCOUNTER — Other Ambulatory Visit: Payer: Self-pay | Admitting: Internal Medicine

## 2020-06-12 ENCOUNTER — Telehealth: Payer: Self-pay | Admitting: Internal Medicine

## 2020-06-12 NOTE — Telephone Encounter (Signed)
I called and spoke with Amy Marks today.  She has now been treated with 4 separate regimens for Helicobacter pylori over the last 2 years and failed each regimen.  Her Helicobacter breath test was positive again after her latest treatment.  I talked her about the possibility of repeat endoscopy with gastric biopsies sent for Helicobacter culture.  Unfortunately with the current second wave of Covid elective endoscopies are likely to be postponed for quite some time.  I also talked to her about the possibility of a 5th round of treatment using a different, boosted regimen.  I will review possible treatment regimens and get back in touch with her within the next week.

## 2020-06-18 ENCOUNTER — Encounter: Payer: Self-pay | Admitting: Family Medicine

## 2020-06-18 ENCOUNTER — Other Ambulatory Visit: Payer: Self-pay

## 2020-06-18 ENCOUNTER — Ambulatory Visit: Payer: BC Managed Care – PPO | Admitting: Family Medicine

## 2020-06-18 VITALS — BP 110/70 | HR 81 | Temp 97.9°F | Ht 63.0 in | Wt 190.2 lb

## 2020-06-18 DIAGNOSIS — F419 Anxiety disorder, unspecified: Secondary | ICD-10-CM

## 2020-06-18 DIAGNOSIS — F32A Depression, unspecified: Secondary | ICD-10-CM

## 2020-06-18 DIAGNOSIS — M7501 Adhesive capsulitis of right shoulder: Secondary | ICD-10-CM | POA: Diagnosis not present

## 2020-06-18 DIAGNOSIS — F329 Major depressive disorder, single episode, unspecified: Secondary | ICD-10-CM | POA: Diagnosis not present

## 2020-06-18 MED ORDER — SERTRALINE HCL 100 MG PO TABS: 100.0000 mg | ORAL_TABLET | Freq: Every day | ORAL | 0 refills | Status: DC

## 2020-06-18 NOTE — Progress Notes (Signed)
Amy T. Copland, MD, [place]  Primary Care and Sports Medicine [facility] at [facility] [place], [address]  Phone: [phone]  FAX: [phone]  MARQUEL Marks - 49 y.o. female  MRN [id number]  Date of Birth: 16-May-1971  Date: 06/18/2020  PCP: Elby Beck, FNP  Referral: Elby Beck, FNP  Chief Complaint  Patient presents with  . Follow-up    Right Shoulder    This visit occurred during the SARS-CoV-2 public health emergency.  Safety protocols were in place, including screening questions prior to the visit, additional usage of staff PPE, and extensive cleaning of exam room while observing appropriate contact time as indicated for disinfecting solutions.   Subjective:   Amy Marks is a 49 y.o. very pleasant female patient with Body mass index is 33.7 kg/m. who presents with the following:  F/u Adhesive shoulder.  Much better.   Her range of motion is much better.  She is still having pain with terminal internal range of motion, and this is the least improved compared to all of the others.  She is been quite compliant with her frozen shoulder protocol, less so in the last week.  04/16/2020 Last OV with Owens Loffler, MD  The patient noted above presents with shoulder pain that has been ongoing for 1 month there is no history of trauma or accident. The patient denies neck pain or radicular symptoms. No shoulder blade pain Denies dislocation, subluxation, separation of the shoulder. The patient does complain of pain with flexion, abduction, and terminal motion.  Significant restriction of motion. she describes a deep ache around the shoulder, and sometimes it will wake the patient up at night.   1 month R sided shoulder pain:   Pain with loss of motion and really bad pain Pain with sleeping.  No injury No problems at work Arm propped up at work.      Review of Systems is noted in the HPI, as  appropriate   Objective:   BP 110/70   Pulse 81   Temp 97.9 F (36.6 C) (Temporal)   Ht 5\' 3"  (1.6 m)   Wt 190 lb 4 oz (86.3 kg)   LMP 06/16/2020   SpO2 98%   BMI 33.70 kg/m    GEN: No acute distress; alert,appropriate. PULM: Breathing comfortably in no respiratory distress PSYCH: Normally interactive.    Right shoulder: In the planes of abduction, flexion, and external rotation, there is equal range of motion compared to the contralateral side.  In internal range of motion with the shoulder abducted to 90, she is able to rotate to approximately 40 degrees decreased compared to the contralateral side, but this is approaching 90 degrees.  Radiology: No results found.  Assessment and Plan:     ICD-10-CM   1. Adhesive capsulitis of right shoulder  M75.01   2. Anxiety and depression  F41.9 sertraline (ZOLOFT) 100 MG tablet   F32.9    She has much improved.  Continue to work on her frozen shoulder protocol, particularly with her internal range of motion, and I think that she only needs to follow-up with me as needed  Follow-up: No follow-ups on file.  Meds ordered this encounter  Medications  . sertraline (ZOLOFT) 100 MG tablet    Sig: Take 1 tablet (100 mg total) by mouth daily.    Dispense:  90 tablet    Refill:  0   Medications Discontinued During This Encounter  Medication Reason  . bismuth-metronidazole-tetracycline (PYLERA) 140-125-125 MG capsule Completed Course  . esomeprazole (NEXIUM) 20 MG capsule Completed Course  . fluticasone (FLONASE) 50 MCG/ACT nasal spray Completed Course  . ibuprofen (ADVIL,MOTRIN) 800 MG tablet Completed Course  . sertraline (ZOLOFT) 100 MG tablet Reorder   No orders of the defined types were placed in this encounter.   Signed,  Maud Deed. Copland, MD   Outpatient Encounter Medications as of 06/18/2020  Medication Sig  . fexofenadine (ALLEGRA) 60 MG tablet Take 60 mg by mouth daily.   . propranolol (INDERAL) 10 MG tablet 1 -  2 tablets 30 minutes before social situation  . sertraline (ZOLOFT) 100 MG tablet Take 1 tablet (100 mg total) by mouth daily.  . [DISCONTINUED] sertraline (ZOLOFT) 100 MG tablet Take 1 tablet (100 mg total) by mouth daily.  Marland Kitchen omeprazole (PRILOSEC) 20 MG capsule Take 1 capsule (20 mg total) by mouth 2 (two) times daily before a meal for 14 days.  . [DISCONTINUED] bismuth-metronidazole-tetracycline (PYLERA) 140-125-125 MG capsule Take 3 capsules by mouth 4 (four) times daily -  before meals and at bedtime. (Patient not taking: Reported on 06/07/2020)  . [DISCONTINUED] esomeprazole (NEXIUM) 20 MG capsule Take 1 capsule (20 mg total) by mouth 2 (two) times daily before a meal. (Patient not taking: Reported on 06/07/2020)  . [DISCONTINUED] fluticasone (FLONASE) 50 MCG/ACT nasal spray Place 2 sprays into both nostrils daily. (Patient not taking: Reported on 06/07/2020)  . [DISCONTINUED] ibuprofen (ADVIL,MOTRIN) 800 MG tablet Take 800 mg by mouth as needed.  (Patient not taking: Reported on 06/07/2020)   No facility-administered encounter medications on file as of 06/18/2020.

## 2020-06-27 ENCOUNTER — Ambulatory Visit: Payer: BC Managed Care – PPO | Admitting: Internal Medicine

## 2020-06-29 DIAGNOSIS — F411 Generalized anxiety disorder: Secondary | ICD-10-CM | POA: Diagnosis not present

## 2020-07-26 ENCOUNTER — Ambulatory Visit: Payer: BC Managed Care – PPO | Admitting: Internal Medicine

## 2020-07-27 ENCOUNTER — Other Ambulatory Visit: Payer: Self-pay

## 2020-07-27 ENCOUNTER — Ambulatory Visit
Admission: RE | Admit: 2020-07-27 | Discharge: 2020-07-27 | Disposition: A | Discharge status: Home / Self Care | Payer: BC Managed Care – PPO | Source: Ambulatory Visit | Attending: Obstetrics and Gynecology | Admitting: Obstetrics and Gynecology

## 2020-07-27 DIAGNOSIS — R928 Other abnormal and inconclusive findings on diagnostic imaging of breast: Secondary | ICD-10-CM | POA: Diagnosis not present

## 2020-07-27 DIAGNOSIS — N6489 Other specified disorders of breast: Secondary | ICD-10-CM

## 2020-07-27 IMAGING — MG MM DIGITAL DIAGNOSTIC UNILAT*R* W/ TOMO W/ CAD
4 series · 4 of 12 positions shown · non-contrast
Comparison: Previous exam(s).

CLINICAL DATA: First six-month follow-up for probably benign
asymmetry in the RIGHT breast. Patient had MRI performed [DATE],
showing no abnormality.

EXAM:
DIGITAL DIAGNOSTIC UNILATERAL RIGHT MAMMOGRAM WITH TOMO AND CAD

[R CC synth-2D]
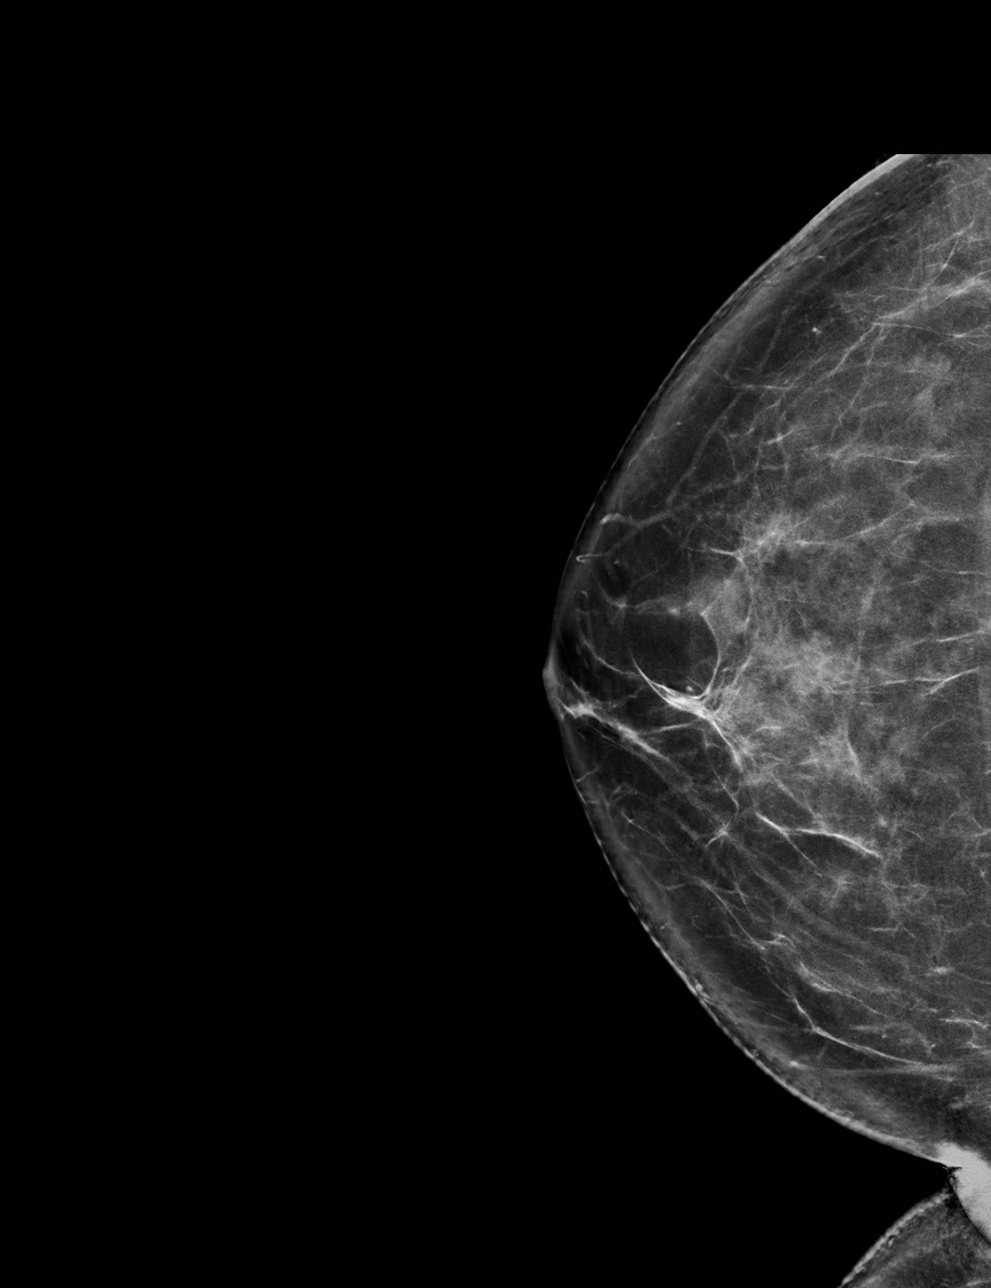

[R MLO synth-2D]
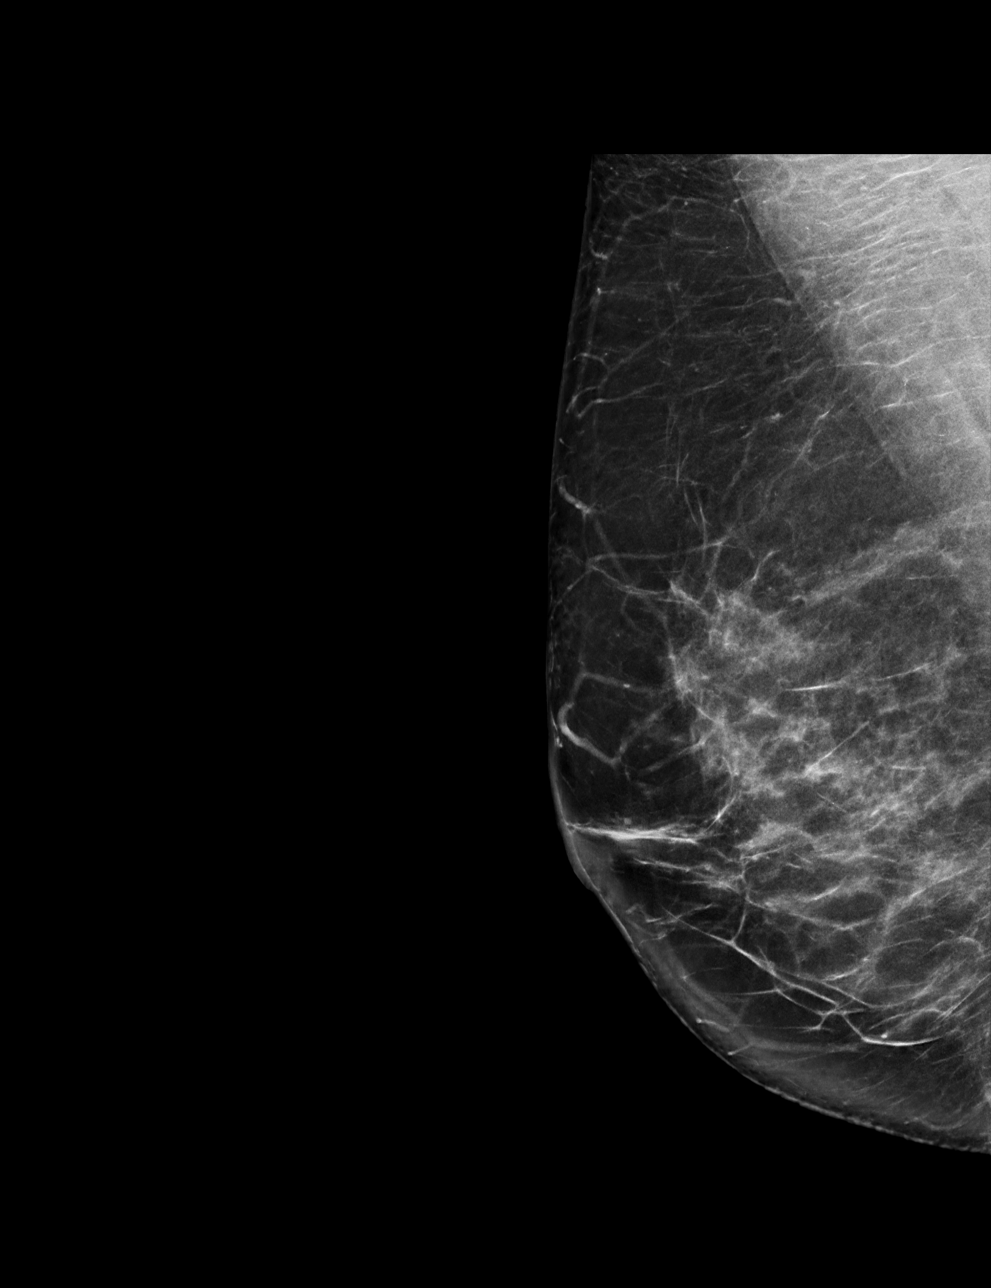

[R CC tomo · tomo slice 39/76.0]
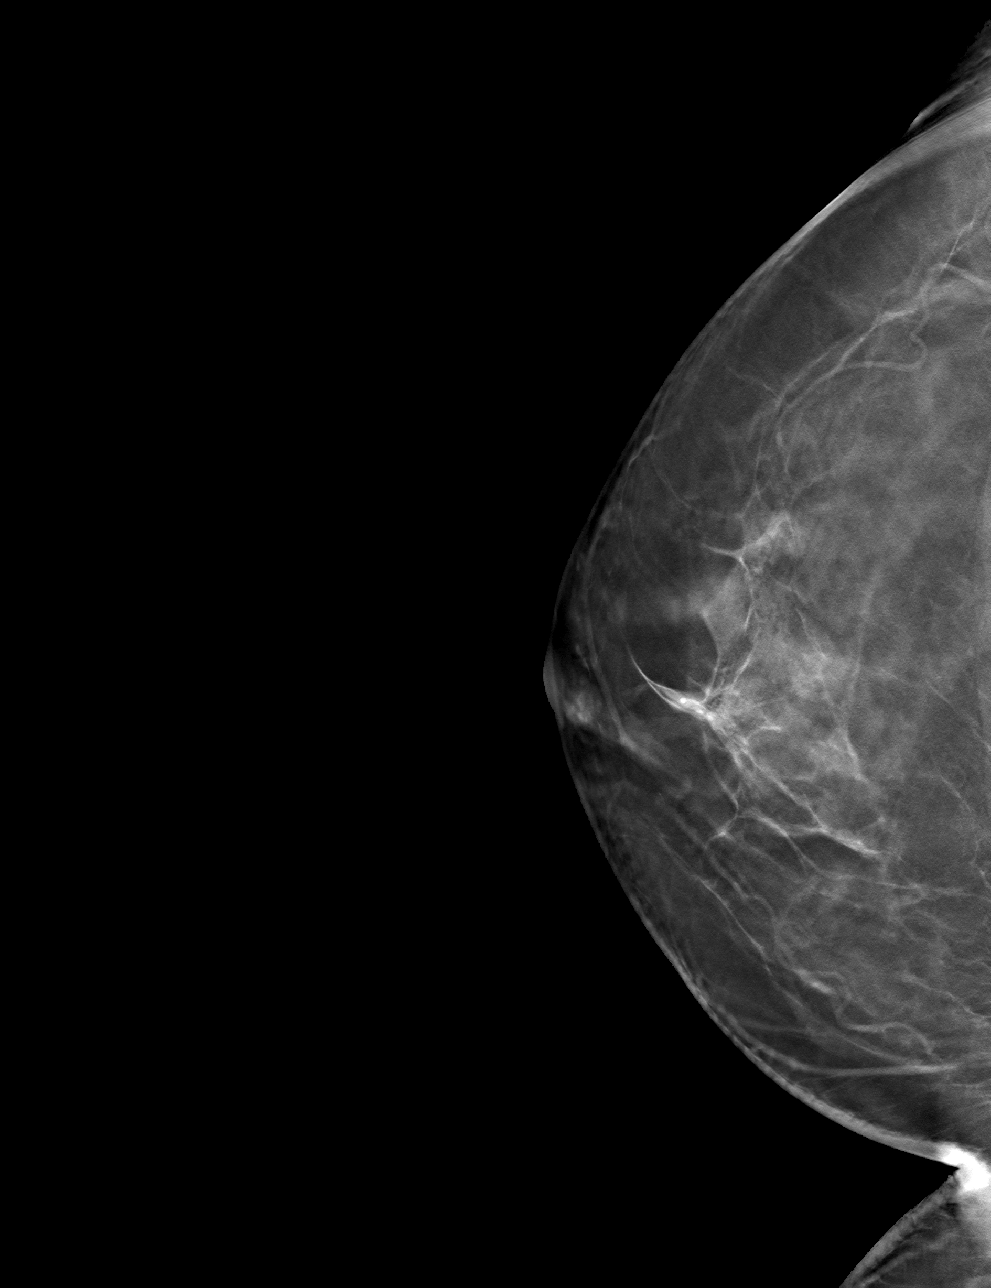

[R MLO tomo · tomo slice 35/70.0]
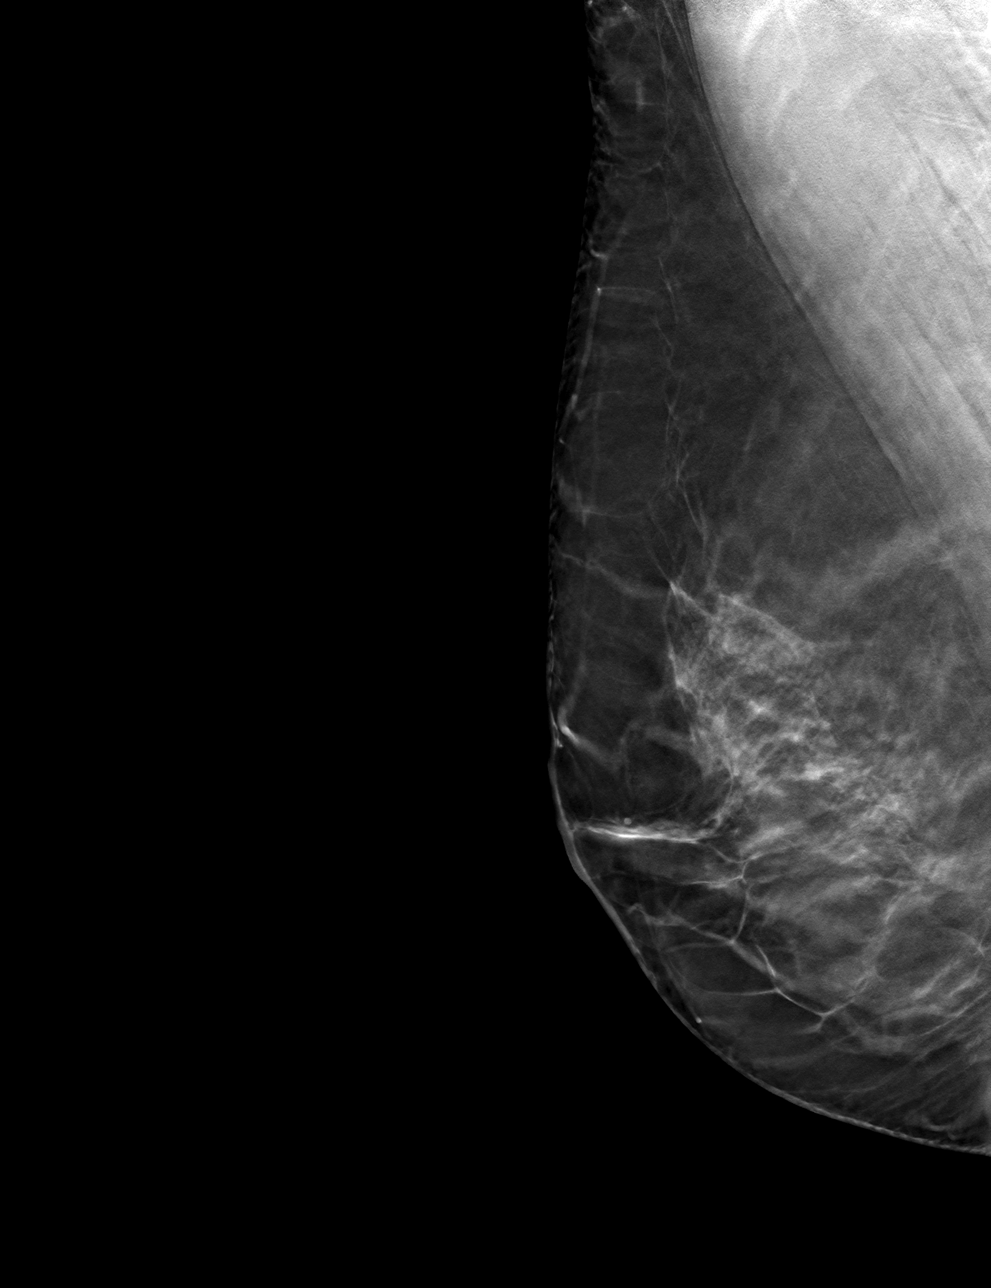

[4 of 12 positions shown; findings below may reference images not displayed]

ACR Breast Density Category b: There are scattered areas of
fibroglandular density.
FINDINGS: Asymmetry in the superior portion of the RIGHT breast appears stable
compared with multiple previous exams including [53]. No associated
nodularity or distortion.

Mammographic images were processed with CAD.
IMPRESSION: Stable appearance of asymmetry in the RIGHT breast, likely benign
breast tissue.

RECOMMENDATION:
Recommend bilateral diagnostic mammogram in 6 months.

I have discussed the findings and recommendations with the patient.
If applicable, a reminder letter will be sent to the patient
regarding the next appointment.

BI-RADS CATEGORY  3: Probably benign.

## 2020-08-02 ENCOUNTER — Ambulatory Visit: Payer: BC Managed Care – PPO | Admitting: Internal Medicine

## 2020-08-03 DIAGNOSIS — F411 Generalized anxiety disorder: Secondary | ICD-10-CM | POA: Diagnosis not present

## 2020-08-05 ENCOUNTER — Other Ambulatory Visit: Payer: Self-pay | Admitting: Family Medicine

## 2020-08-05 DIAGNOSIS — E669 Obesity, unspecified: Secondary | ICD-10-CM

## 2020-08-09 ENCOUNTER — Encounter: Payer: Self-pay | Admitting: Internal Medicine

## 2020-08-09 ENCOUNTER — Ambulatory Visit: Payer: BC Managed Care – PPO | Admitting: Internal Medicine

## 2020-08-09 ENCOUNTER — Other Ambulatory Visit: Payer: Self-pay

## 2020-08-09 DIAGNOSIS — A048 Other specified bacterial intestinal infections: Secondary | ICD-10-CM | POA: Diagnosis not present

## 2020-08-09 NOTE — Assessment & Plan Note (Addendum)
She has persistent Helicobacter pylori infection despite being treated 4 times in the past several years.  I believe that her adherence with her recent regimens has been good.  There is a possibility that her isolate has evolving antibiotic resistance.  I reviewed the range of management options with her including simply repeating her H. pylori breath test today to see if she remains positive, treating her with a boosted, alternative regimen such as amoxicillin, metronidazole, rifabutin and esomeprazole for 14 days, or asking Dr. Lucio Edward to repeat upper endoscopy and submit biopsies for Helicobacter pylori culture.  If cultures are positive we could request antibiotic susceptibility testing then select an appropriate salvage regimen.  She would like to pursue repeat testing for H. pylori before making any further decisions and I am in agreement with that plan.

## 2020-08-09 NOTE — Progress Notes (Signed)
[facility] for Infectious Disease  Patient Active Problem List   Diagnosis Date Noted  . Gastritis 04/25/2020    Priority: High  . H. pylori infection 03/02/2020    Priority: High  . Tobacco abuse 06/20/2019  . Anxiety and depression 06/20/2019  . Decreased hearing, left 06/09/2019  . Increased risk of breast cancer 09/24/2018  . Rash 06/19/2017  . Obesity (BMI 30.0-34.9) 12/08/2014  . Social anxiety disorder 09/12/2013  . Major depressive disorder, recurrent ([place]) 06/27/2013  . Carpal tunnel syndrome, bilateral 12/01/2011  . ANXIETY STATE, UNSPECIFIED 01/02/2011  . DISORDERS, OBSESSIVE-COMPULSIVE 03/26/2007    Patient's Medications  New Prescriptions   No medications on file  Previous Medications   FEXOFENADINE (ALLEGRA) 60 MG TABLET    Take 60 mg by mouth daily.    OMEPRAZOLE (PRILOSEC) 20 MG CAPSULE    Take 1 capsule (20 mg total) by mouth 2 (two) times daily before a meal for 14 days.   PROPRANOLOL (INDERAL) 10 MG TABLET    1 - 2 tablets 30 minutes before social situation   SERTRALINE (ZOLOFT) 100 MG TABLET    Take 1 tablet (100 mg total) by mouth daily.  Modified Medications   No medications on file  Discontinued Medications   No medications on file    Subjective: Amy Marks is in for her routine follow-up visit.  She developed acid indigestion and abdominal bloating about 2 years ago.  She was seen at a local urgent care.  She recalls that blood work revealed that she had H. pylori infection.  She recalls being treated with 3 medications.  She believes one was amoxicillin and a second was Zantac.  She cannot recall the third drug.  She had a very mild rash on her back that resolved after she completed therapy.  Her symptoms went away completely.  Her symptoms recurred in early January of this year.  Her stool was positive for H. pylori antigen.  She was treated with a 2-week course of omeprazole, clarithromycin, amoxicillin and Pepto-Bismol.  She recalls  having a rash on her lower abdomen before she started those medications.  The rash went away after she started the medications.  Her symptoms improved but repeat H. pylori antigen was positive again on 12/22/2019.  It appears she was treated with omeprazole, amoxicillin, levofloxacin and Pepto-Bismol.  Stool antigen testing was positive again on 02/17/2020.  She underwent EGD on 04/05/2020 which showed gastritis and mild duodenal erythema.  There was no bleeding.  Warthin-Starry silver stain was positive for H. pylori.   At the time of her initial visit with me in July I decided to start her on a 4 drug regimen bismuth, tetracycline, metronidazole and Nexium.  She had no problems tolerating or completing her regimen.  Unfortunately her repeat H. pylori breath test was positive again on 06/07/2020.  Fortunately she is feeling better and having no current reflux symptoms.  There has been no change in her pattern of chronic nausea and diarrhea.    Review of Systems: Review of Systems  Gastrointestinal: Positive for abdominal pain, diarrhea and nausea. Negative for heartburn and vomiting.    Past Medical History:  Diagnosis Date  . Anxiety   . Depression   . H. pylori infection 2018   treated with abx  . IBS (irritable bowel syndrome)   . OCD (obsessive compulsive disorder)   . Seasonal allergies   . Social anxiety disorder 09/12/2013    Social History  Tobacco Use  . Smoking status: Current Every Day Smoker    Packs/day: 0.50    Types: Cigarettes  . Smokeless tobacco: Never Used  . Tobacco comment: started smoking again 2015  Vaping Use  . Vaping Use: Never used  Substance Use Topics  . Alcohol use: Not Currently    Comment: 0-1 per per day, occasional  . Drug use: No    Family History  Problem Relation Age of Onset  . Depression Mother   . Fibromyalgia Mother   . Breast cancer Mother 56  . Rheum arthritis Mother   . Alcohol abuse Father   . Emphysema Maternal Grandmother          Dec COPD  . Breast cancer Maternal Grandmother 39  . Multiple sclerosis Maternal Grandfather   . Breast cancer Sister 69  . Breast cancer Paternal Aunt 13       A & W, maternal great aunt  . Crohn's disease Other        maternal gerat aunt  . Rectal cancer Other        maternal third cousin   . Colon cancer Neg Hx   . Esophageal cancer Neg Hx   . Stomach cancer Neg Hx     No Known Allergies  Objective: Vitals:   08/09/20 0905  BP: 118/82  Pulse: 68  Temp: 98.3 F (36.8 C)  TempSrc: Oral  SpO2: 100%  Weight: 188 lb (85.3 kg)  Height: 5\' 3"  (1.6 m)   Body mass index is 33.3 kg/m.  Physical Exam Constitutional:      Comments: She is in good spirits.  Abdominal:     General: There is no distension.     Palpations: Abdomen is soft.     Comments: She has mild epigastric discomfort with palpation.  Psychiatric:        Mood and Affect: Mood normal.       Problem List Items Addressed This Visit      High   H. pylori infection    She has persistent Helicobacter pylori infection despite being treated 4 times in the past several years.  I believe that her adherence with her recent regimens has been good.  There is a possibility that her isolate has evolving antibiotic resistance.  I reviewed the range of management options with her including simply repeating her H. pylori breath test today to see if she remains positive, treating her with a boosted, alternative regimen such as amoxicillin, metronidazole, rifabutin and esomeprazole for 14 days, or asking Dr. Lucio Edward to repeat upper endoscopy and submit biopsies for Helicobacter pylori culture.  If cultures are positive we could request antibiotic susceptibility testing then select an appropriate salvage regimen.  She would like to pursue repeat testing for H. pylori before making any further decisions and I am in agreement with that plan.      Relevant Orders   H. pylori breath test       Michel Bickers,  MD [facility] for Infectious [place] [phone] pager   [phone] cell 08/09/2020, 9:26 AM

## 2020-08-10 ENCOUNTER — Other Ambulatory Visit (INDEPENDENT_AMBULATORY_CARE_PROVIDER_SITE_OTHER): Payer: BC Managed Care – PPO

## 2020-08-10 DIAGNOSIS — E669 Obesity, unspecified: Secondary | ICD-10-CM | POA: Diagnosis not present

## 2020-08-10 LAB — LIPID PANEL
Cholesterol: 188 mg/dL (ref 0–200)
HDL: 56.2 mg/dL (ref >39.00)
LDL Cholesterol: 123 mg/dL — ABNORMAL HIGH (ref 0–99)
NonHDL: 131.57
Total CHOL/HDL Ratio: 3
Triglycerides: 42 mg/dL (ref 0.0–149.0)
VLDL: 8.4 mg/dL (ref 0.0–40.0)

## 2020-08-10 LAB — HEMOGLOBIN A1C: Hgb A1c MFr Bld: 5.2 % (ref 4.6–6.5)

## 2020-08-10 LAB — H. PYLORI BREATH TEST: H. pylori Breath Test: DETECTED — AB

## 2020-08-14 ENCOUNTER — Telehealth: Payer: Self-pay | Admitting: *Deleted

## 2020-08-14 NOTE — Telephone Encounter (Signed)
Dr Megan Salon would like to follow up with patient regarding next steps via a virtual visit 08/15/20 at 11:30.  RN called patient, left message asking her to call back to see if this will work for her. MyChart Message sent, too. Landis Gandy, RN

## 2020-08-15 ENCOUNTER — Other Ambulatory Visit: Payer: Self-pay

## 2020-08-15 ENCOUNTER — Telehealth: Payer: Self-pay

## 2020-08-15 ENCOUNTER — Telehealth (INDEPENDENT_AMBULATORY_CARE_PROVIDER_SITE_OTHER): Payer: BC Managed Care – PPO | Admitting: Internal Medicine

## 2020-08-15 DIAGNOSIS — A048 Other specified bacterial intestinal infections: Secondary | ICD-10-CM | POA: Diagnosis not present

## 2020-08-15 MED ORDER — ESOMEPRAZOLE MAGNESIUM 40 MG PO CPDR: 40.0000 mg | DELAYED_RELEASE_CAPSULE | Freq: Two times a day (BID) | ORAL | 0 refills | Status: DC

## 2020-08-15 MED ORDER — METRONIDAZOLE 500 MG PO TABS: 500.0000 mg | ORAL_TABLET | Freq: Three times a day (TID) | ORAL | 0 refills | Status: DC

## 2020-08-15 MED ORDER — RIFABUTIN 150 MG PO CAPS: 300.0000 mg | ORAL_CAPSULE | Freq: Every day | ORAL | 0 refills | Status: DC

## 2020-08-15 MED ORDER — AMOXICILLIN 500 MG PO CAPS: 1000.0000 mg | ORAL_CAPSULE | Freq: Three times a day (TID) | ORAL | 0 refills | Status: DC

## 2020-08-15 NOTE — Progress Notes (Signed)
Virtual Visit via Telephone Note  I connected with Amy Marks on 08/15/20 at 11:30 AM EDT by telephone and verified that I am speaking with the correct person using two identifiers.  Location: Patient: Work Provider: [facility]   I discussed the limitations, risks, security and privacy concerns of performing an evaluation and management service by telephone and the availability of in person appointments. I also discussed with the patient that there may be a patient responsible charge related to this service. The patient expressed understanding and agreed to proceed.   History of Present Illness: I called and spoke with Amy Marks by phone today.  Unfortunately she is failed for rounds of treatment for Helicobacter pylori gastritis.   Observations/Objective: Repeat H. pylori breath test + 08/10/2020  Assessment and Plan: I have recommended a salvage regimen for her persistent H. pylori infection including amoxicillin, metronidazole, rifabutin and esomeprazole.  I reviewed how to take her medication and informed her to avoid alcohol while on metronidazole. She is in agreement with that plan.    Follow Up Instructions: 1. Amoxicillin 1 g 3 times daily for 2 weeks 2. Metronidazole 500 mg 3 times daily for 2 weeks 3. Rifabutin 300 mg daily for 2 weeks 4. Esomeprazole 40 mg twice daily for 2 weeks 5. Follow-up in 6 weeks for repeat H. pylori breath test   I discussed the assessment and treatment plan with the patient. The patient was provided an opportunity to ask questions and all were answered. The patient agreed with the plan and demonstrated an understanding of the instructions.   The patient was advised to call back or seek an in-person evaluation if the symptoms worsen or if the condition fails to improve as anticipated.  I provided 15 minutes of non-face-to-face time during this encounter.   Michel Bickers, MD

## 2020-08-15 NOTE — Telephone Encounter (Signed)
Pharmacy called office today regarding Mycobutin prescription. Pharmacy states that medication is not carried by them. [facility] does not carry medication either.  Pharmacy requesting alternative prescription or medication be filled at alternative pharmacy. [place]

## 2020-08-16 ENCOUNTER — Other Ambulatory Visit: Payer: Self-pay | Admitting: Pharmacist

## 2020-08-16 DIAGNOSIS — A048 Other specified bacterial intestinal infections: Secondary | ICD-10-CM

## 2020-08-16 MED ORDER — RIFABUTIN 150 MG PO CAPS: 300.0000 mg | ORAL_CAPSULE | Freq: Every day | ORAL | 0 refills | Status: DC

## 2020-08-16 MED FILL — RIFABUTIN 150 MG CAPSULE: 150 | 14 days supply | Qty: 28 | Fill #0

## 2020-08-16 NOTE — Telephone Encounter (Signed)
Big thanks.

## 2020-08-16 NOTE — Telephone Encounter (Signed)
Checked with [facility]. We are able to access rifabutin. Sent Rx over and Butch Penny will call patient to help coordinate.

## 2020-08-16 NOTE — Progress Notes (Signed)
[facility] is able to access rifabutin. Sending Rx over there now. Butch Penny will call patient to coordinate.

## 2020-08-17 ENCOUNTER — Other Ambulatory Visit: Payer: Self-pay

## 2020-08-17 ENCOUNTER — Encounter: Payer: Self-pay | Admitting: Family Medicine

## 2020-08-17 ENCOUNTER — Ambulatory Visit: Payer: BC Managed Care – PPO | Admitting: Family Medicine

## 2020-08-17 VITALS — BP 112/84 | HR 78 | Temp 97.1°F | Ht 62.75 in | Wt 187.0 lb

## 2020-08-17 DIAGNOSIS — Z0000 Encounter for general adult medical examination without abnormal findings: Secondary | ICD-10-CM | POA: Diagnosis not present

## 2020-08-17 DIAGNOSIS — Z720 Tobacco use: Secondary | ICD-10-CM | POA: Diagnosis not present

## 2020-08-17 DIAGNOSIS — R21 Rash and other nonspecific skin eruption: Secondary | ICD-10-CM

## 2020-08-17 DIAGNOSIS — F32A Depression, unspecified: Secondary | ICD-10-CM

## 2020-08-17 DIAGNOSIS — F419 Anxiety disorder, unspecified: Secondary | ICD-10-CM | POA: Diagnosis not present

## 2020-08-17 DIAGNOSIS — E669 Obesity, unspecified: Secondary | ICD-10-CM | POA: Diagnosis not present

## 2020-08-17 MED ORDER — SERTRALINE HCL 100 MG PO TABS: 100.0000 mg | ORAL_TABLET | Freq: Every day | ORAL | 3 refills | Status: DC

## 2020-08-17 NOTE — Progress Notes (Signed)
Subjective:    Patient ID: Amy Marks, female    DOB: 04-28-1971, 49 y.o.   MRN: [id number]  HPI Chief Complaint  Patient presents with  . Annual Exam    refill zoloft   This is a 49 yo female who presents today for annual exam. Increased stress, uncle passed away. Other family members ill.   Last CPE- 06/20/2019 Mammo- 07/27/2020 Pap- 06/27/2016- negative, negative HPV, Sees Dr. Quincy Simmonds Colonoscopy- 02/14/2013 Tdap- 06/27/2016 Flu- declines Covid 19 vaccine- vaccinated Eye- regular Dental- regular Tobacco abuse- smoking 1/2 ppd, is interested in quitting  Recurrent H. pylori-has been followed by ID and is getting ready to start a new 14-day course.  Is waiting for antibiotics to be mailed to her.  If this is not successful in eradicating her H. pylori, she will need to go to GI for EGD, biopsies with culture and sensitivity.   Mood-currently on sertraline 100 mg, denies side effects.  Feels like she is coping well considering how much she has going on right now.  Review of Systems  Constitutional: Negative.   HENT: Negative.   Eyes: Negative.   Respiratory: Negative.   Cardiovascular: Negative.   Gastrointestinal: Positive for abdominal pain (with h. pylori, no change).  Endocrine: Negative.   Genitourinary: Negative.   Musculoskeletal:       Bilateral shoulder pain, followed by Dr. Lorelei Pont.   Skin: Positive for rash (left lower abdomen, itchy, has applied Benadryl cream. ? related to h. pylori. ).  Allergic/Immunologic: Negative.   Neurological: Negative.   Hematological: Negative.   Psychiatric/Behavioral: Positive for sleep disturbance (occassional early awakening).       Objective:   Physical Exam Physical Exam  Constitutional: She is oriented to person, place, and time. She appears well-developed and well-nourished. No distress.  HENT:  Head: Normocephalic and atraumatic.  Right Ear: External ear normal. TM normal.  Left Ear: External ear normal. TM normal.   Nose: Nose normal.  Mouth/Throat: Oropharynx is clear and moist. No oropharyngeal exudate.  Eyes: Conjunctivae are normal.   Neck: Normal range of motion. Neck supple. No JVD present. No thyromegaly present.  Cardiovascular: Normal rate, regular rhythm, normal heart sounds and intact distal pulses.   Pulmonary/Chest: Effort normal and breath sounds normal. Right breast exhibits no inverted nipple, no mass, no nipple discharge, no skin change and no tenderness. Left breast exhibits no inverted nipple, no mass, no nipple discharge, no skin change and no tenderness. Breasts are symmetrical.  Abdominal: Soft. Bowel sounds are normal. She exhibits no distension and no mass. There is no tenderness. There is no rebound and no guarding.  Musculoskeletal: Normal range of motion. She exhibits no edema or tenderness.  Lymphadenopathy:    She has no cervical adenopathy.  Neurological: She is alert and oriented to person, place, and time.   Skin: Skin is warm and dry. She is not diaphoretic. Left lower abdomen at waist line with linear area of erythema, no rash.  Psychiatric: She has a normal mood and affect. Her behavior is normal. Judgment and thought content normal.  Vitals reviewed.    BP 112/84   Pulse 78   Temp (!) 97.1 F (36.2 C) (Temporal)   Ht 5' 2.75" (1.594 m)   Wt 187 lb (84.8 kg)   LMP  (LMP Unknown) Comment: has had period within last month  SpO2 100%   BMI 33.39 kg/m  Wt Readings from Last 3 Encounters:  08/17/20 187 lb (84.8 kg)  08/09/20 188 lb (  85.3 kg)  06/18/20 190 lb 4 oz (86.3 kg)   Depression screen [facility] 2/9 08/17/2020 08/09/2020 04/25/2020 06/20/2019  Decreased Interest 2 0 0 1  Down, Depressed, Hopeless 2 0 0 1  PHQ - 2 Score 4 0 0 2  Altered sleeping 0 - - 1  Tired, decreased energy 3 - - 2  Change in appetite 0 - - 2  Feeling bad or failure about yourself  0 - - 1  Trouble concentrating 0 - - 0  Moving slowly or fidgety/restless 0 - - 0  Suicidal thoughts 0 - - 0   PHQ-9 Score 7 - - 8  Difficult doing work/chores Not difficult at all - - Not difficult at all       Assessment & Plan:  1. Annual physical exam -She had labs prior to today's visit and we reviewed the results -Reviewed health maintenance, she is up-to-date, declines flu vaccine today.  2. Anxiety and depression -She feels that she is coping well, no change, encouraged her to ensure adequate sleep, regular exercise, stress management - sertraline (ZOLOFT) 100 MG tablet; Take 1 tablet (100 mg total) by mouth daily.  Dispense: 90 tablet; Refill: 3  3. Tobacco abuse -She is interested in starting Chantix when she finishes upcoming H. pylori treatment.  I provided her with some written information and she will let me know when your urine appointment  4. Obesity (BMI 30.0-34.9) -No change in weight, normal hemoglobin A1c, discussed healthy food choices which can be more difficult for her due to stomach issues with her H. pylori  5. Rash -Likely irritation, discussed using over-the-counter hydrocortisone cream, follow-up if no improvement  This visit occurred during the SARS-CoV-2 public health emergency.  Safety protocols were in place, including screening questions prior to the visit, additional usage of staff PPE, and extensive cleaning of exam room while observing appropriate contact time as indicated for disinfecting solutions.      Clarene Reamer, FNP-BC  [facility]  08/17/2020 10:20 AM

## 2020-08-17 NOTE — Patient Instructions (Addendum)
Good to see you today  Let me know if you want to start Chantix, can send me a mychart message  Coping with Quitting Smoking  Quitting smoking is a physical and mental challenge. You will face cravings, withdrawal symptoms, and temptation. Before quitting, work with your health care provider to make a plan that can help you cope. Preparation can help you quit and keep you from giving in. How can I cope with cravings? Cravings usually last for 5-10 minutes. If you get through it, the craving will pass. Consider taking the following actions to help you cope with cravings:  Keep your mouth busy: ? Chew sugar-free gum. ? Suck on hard candies or a straw. ? Brush your teeth.  Keep your hands and body busy: ? Immediately change to a different activity when you feel a craving. ? Squeeze or play with a ball. ? Do an activity or a hobby, like making bead jewelry, practicing needlepoint, or working with wood. ? Mix up your normal routine. ? Take a short exercise break. Go for a quick walk or run up and down stairs. ? Spend time in public places where smoking is not allowed.  Focus on doing something kind or helpful for someone else.  Call a friend or family member to talk during a craving.  Join a support group.  Call a quit line, such as 1-800-QUIT-NOW.  Talk with your health care provider about medicines that might help you cope with cravings and make quitting easier for you. How can I deal with withdrawal symptoms? Your body may experience negative effects as it tries to get used to not having nicotine in the system. These effects are called withdrawal symptoms. They may include:  Feeling hungrier than normal.  Trouble concentrating.  Irritability.  Trouble sleeping.  Feeling depressed.  Restlessness and agitation.  Craving a cigarette. To manage withdrawal symptoms:  Avoid places, people, and activities that trigger your cravings.  Remember why you want to quit.  Get  plenty of sleep.  Avoid coffee and other caffeinated drinks. These may worsen some of your symptoms. How can I handle social situations? Social situations can be difficult when you are quitting smoking, especially in the first few weeks. To manage this, you can:  Avoid parties, bars, and other social situations where people might be smoking.  Avoid alcohol.  Leave right away if you have the urge to smoke.  Explain to your family and friends that you are quitting smoking. Ask for understanding and support.  Plan activities with friends or family where smoking is not an option. What are some ways I can cope with stress? Wanting to smoke may cause stress, and stress can make you want to smoke. Find ways to manage your stress. Relaxation techniques can help. For example:  Breathe slowly and deeply, in through your nose and out through your mouth.  Listen to soothing, relaxing music.  Talk with a family member or friend about your stress.  Light a candle.  Soak in a bath or take a shower.  Think about a peaceful place. What are some ways I can prevent weight gain? Be aware that many people gain weight after they quit smoking. However, not everyone does. To keep from gaining weight, have a plan in place before you quit and stick to the plan after you quit. Your plan should include:  Having healthy snacks. When you have a craving, it may help to: ? Eat plain popcorn, crunchy carrots, celery, or other cut  vegetables. ? Chew sugar-free gum.  Changing how you eat: ? Eat small portion sizes at meals. ? Eat 4-6 small meals throughout the day instead of 1-2 large meals a day. ? Be mindful when you eat. Do not watch television or do other things that might distract you as you eat.  Exercising regularly: ? Make time to exercise each day. If you do not have time for a long workout, do short bouts of exercise for 5-10 minutes several times a day. ? Do some form of strengthening exercise,  like weight lifting, and some form of aerobic exercise, like running or swimming.  Drinking plenty of water or other low-calorie or no-calorie drinks. Drink 6-8 glasses of water daily, or as much as instructed by your health care provider. Summary  Quitting smoking is a physical and mental challenge. You will face cravings, withdrawal symptoms, and temptation to smoke again. Preparation can help you as you go through these challenges.  You can cope with cravings by keeping your mouth busy (such as by chewing gum), keeping your body and hands busy, and making calls to family, friends, or a helpline for people who want to quit smoking.  You can cope with withdrawal symptoms by avoiding places where people smoke, avoiding drinks with caffeine, and getting plenty of rest.  Ask your health care provider about the different ways to prevent weight gain, avoid stress, and handle social situations. This information is not intended to replace advice given to you by your health care provider. Make sure you discuss any questions you have with your health care provider. Document Revised: 09/18/2017 Document Reviewed: 10/03/2016 Elsevier Patient Education  [employer].   Varenicline oral tablets What is this medicine? VARENICLINE (var e NI kleen) is used to help people quit smoking. It is used with a patient support program recommended by your physician. This medicine may be used for other purposes; ask your health care provider or pharmacist if you have questions. COMMON BRAND NAME(S): Chantix What should I tell my health care provider before I take this medicine? They need to know if you have any of these conditions:  heart disease  if you often drink alcohol  kidney disease  mental illness  on hemodialysis  seizures  history of stroke  suicidal thoughts, plans, or attempt; a previous suicide attempt by you or a family member  an unusual or allergic reaction to varenicline, other  medicines, foods, dyes, or preservatives  pregnant or trying to get pregnant  breast-feeding How should I use this medicine? Take this medicine by mouth after eating. Take with a full glass of water. Follow the directions on the prescription label. Take your doses at regular intervals. Do not take your medicine more often than directed. There are 3 ways you can use this medicine to help you quit smoking; talk to your health care professional to decide which plan is right for you: 1) you can choose a quit date and start this medicine 1 week before the quit date, or, 2) you can start taking this medicine before you choose a quit date, and then pick a quit date between day 8 and 35 days of treatment, or, 3) if you are not sure that you are able or willing to quit smoking right away, start taking this medicine and slowly decrease the amount you smoke as directed by your health care professional with the goal of being cigarette-free by week 12 of treatment. Stick to your plan; ask about support groups  or other ways to help you remain cigarette-free. If you are motivated to quit smoking and did not succeed during a previous attempt with this medicine for reasons other than side effects, or if you returned to smoking after this treatment, speak with your health care professional about whether another course of this medicine may be right for you. A special MedGuide will be given to you by the pharmacist with each prescription and refill. Be sure to read this information carefully each time. Talk to your pediatrician regarding the use of this medicine in children. This medicine is not approved for use in children. Overdosage: If you think you have taken too much of this medicine contact a poison control center or emergency room at once. NOTE: This medicine is only for you. Do not share this medicine with others. What if I miss a dose? If you miss a dose, take it as soon as you can. If it is almost time for  your next dose, take only that dose. Do not take double or extra doses. What may interact with this medicine?  alcohol  insulin  other medicines used to help people quit smoking  theophylline  warfarin This list may not describe all possible interactions. Give your health care provider a list of all the medicines, herbs, non-prescription drugs, or dietary supplements you use. Also tell them if you smoke, drink alcohol, or use illegal drugs. Some items may interact with your medicine. What should I watch for while using this medicine? It is okay if you do not succeed at your attempt to quit and have a cigarette. You can still continue your quit attempt and keep using this medicine as directed. Just throw away your cigarettes and get back to your quit plan. Talk to your health care provider before using other treatments to quit smoking. Using this medicine with other treatments to quit smoking may increase the risk for side effects compared to using a treatment alone. You may get drowsy or dizzy. Do not drive, use machinery, or do anything that needs mental alertness until you know how this medicine affects you. Do not stand or sit up quickly, especially if you are an older patient. This reduces the risk of dizzy or fainting spells. Decrease the number of alcoholic beverages that you drink during treatment with this medicine until you know if this medicine affects your ability to tolerate alcohol. Some people have experienced increased drunkenness (intoxication), unusual or sometimes aggressive behavior, or no memory of things that have happened (amnesia) during treatment with this medicine. Sleepwalking can happen during treatment with this medicine, and can sometimes lead to behavior that is harmful to you, other people, or property. Stop taking this medicine and tell your doctor if you start sleepwalking or have other unusual sleep-related activity. After taking this medicine, you may get up out  of bed and do an activity that you do not know you are doing. The next morning, you may have no memory of this. Activities include driving a car ("sleep-driving"), making and eating food, talking on the phone, sexual activity, and sleep-walking. Serious injuries have occurred. Stop the medicine and call your doctor right away if you find out you have done any of these activities. Do not take this medicine if you have used alcohol that evening. Do not take it if you have taken another medicine for sleep. The risk of doing these sleep-related activities is higher. Patients and their families should watch out for new or worsening depression or  thoughts of suicide. Also watch out for sudden changes in feelings such as feeling anxious, agitated, panicky, irritable, hostile, aggressive, impulsive, severely restless, overly excited and hyperactive, or not being able to sleep. If this happens, call your health care professional. If you have diabetes and you quit smoking, the effects of insulin may be increased and you may need to reduce your insulin dose. Check with your doctor or health care professional about how you should adjust your insulin dose. What side effects may I notice from receiving this medicine? Side effects that you should report to your doctor or health care professional as soon as possible:  allergic reactions like skin rash, itching or hives, swelling of the face, lips, tongue, or throat  acting aggressive, being angry or violent, or acting on dangerous impulses  breathing problems  changes in emotions or moods  chest pain or chest tightness  feeling faint or lightheaded, falls  hallucination, loss of contact with reality  mouth sores  redness, blistering, peeling or loosening of the skin, including inside the mouth  signs and symptoms of a stroke like changes in vision; confusion; trouble speaking or understanding; severe headaches; sudden numbness or weakness of the face, arm or  leg; trouble walking; dizziness; loss of balance or coordination  seizures  sleepwalking  suicidal thoughts or other mood changes Side effects that usually do not require medical attention (report to your doctor or health care professional if they continue or are bothersome):  constipation  gas  headache  nausea, vomiting  strange dreams  trouble sleeping This list may not describe all possible side effects. Call your doctor for medical advice about side effects. You may report side effects to FDA at 1-800-FDA-1088. Where should I keep my medicine? Keep out of the reach of children. Store at room temperature between 15 and 30 degrees C (59 and 86 degrees F). Throw away any unused medicine after the expiration date. NOTE: This sheet is a summary. It may not cover all possible information. If you have questions about this medicine, talk to your doctor, pharmacist, or health care provider.  2020 Elsevier/Gold Standard (2018-09-24 14:27:36)

## 2020-08-31 DIAGNOSIS — F411 Generalized anxiety disorder: Secondary | ICD-10-CM | POA: Diagnosis not present

## 2020-09-28 DIAGNOSIS — F411 Generalized anxiety disorder: Secondary | ICD-10-CM | POA: Diagnosis not present

## 2020-10-01 ENCOUNTER — Encounter: Payer: Self-pay | Admitting: Family Medicine

## 2020-10-03 ENCOUNTER — Encounter: Payer: Self-pay | Admitting: Family Medicine

## 2020-10-03 ENCOUNTER — Other Ambulatory Visit: Payer: Self-pay | Admitting: Family Medicine

## 2020-10-03 DIAGNOSIS — Z720 Tobacco use: Secondary | ICD-10-CM

## 2020-10-03 MED ORDER — BUPROPION HCL ER (SR) 150 MG PO TB12: ORAL_TABLET | ORAL | 2 refills | Status: DC

## 2020-10-11 ENCOUNTER — Other Ambulatory Visit: Payer: Self-pay

## 2020-10-11 ENCOUNTER — Ambulatory Visit: Payer: BC Managed Care – PPO | Admitting: Internal Medicine

## 2020-10-11 DIAGNOSIS — A048 Other specified bacterial intestinal infections: Secondary | ICD-10-CM | POA: Diagnosis not present

## 2020-10-11 NOTE — Progress Notes (Signed)
[facility]  Patient Active Problem List   Diagnosis Date Noted  . Gastritis 04/25/2020    Priority: High  . H. pylori infection 03/02/2020    Priority: High  . Tobacco abuse 06/20/2019  . Anxiety and depression 06/20/2019  . Decreased hearing, left 06/09/2019  . Increased risk of breast cancer 09/24/2018  . Rash 06/19/2017  . Obesity (BMI 30.0-34.9) 12/08/2014  . Social anxiety disorder 09/12/2013  . Major depressive disorder, recurrent ([place]) 06/27/2013  . Carpal tunnel syndrome, bilateral 12/01/2011  . ANXIETY STATE, UNSPECIFIED 01/02/2011  . DISORDERS, OBSESSIVE-COMPULSIVE 03/26/2007    Patient's Medications  New Prescriptions   No medications on file  Previous Medications   AMOXICILLIN (AMOXIL) 500 MG CAPSULE    Take 2 capsules (1,000 mg total) by mouth 3 (three) times daily.   BUPROPION (WELLBUTRIN SR) 150 MG 12 HR TABLET    1 tablet a day x 3 days then 1 tablet twice a day, at least 8 hours apart, second dose no later than 6 pm.   ESOMEPRAZOLE (NEXIUM) 40 MG CAPSULE    Take 1 capsule (40 mg total) by mouth 2 (two) times daily before a meal.   FEXOFENADINE (ALLEGRA) 60 MG TABLET    Take 60 mg by mouth daily.    METRONIDAZOLE (FLAGYL) 500 MG TABLET    Take 1 tablet (500 mg total) by mouth 3 (three) times daily.   PROPRANOLOL (INDERAL) 10 MG TABLET    1 - 2 tablets 30 minutes before social situation   RIFABUTIN (MYCOBUTIN) 150 MG CAPSULE    Take 2 capsules (300 mg total) by mouth daily.   SERTRALINE (ZOLOFT) 100 MG TABLET    Take 1 tablet (100 mg total) by mouth daily.  Modified Medications   No medications on file  Discontinued Medications   No medications on file    Subjective: Amy Marks is in for her routine follow-up visit.  She has struggled with resistant H. pylori infection and gastritis.  She had previously failed 4 separate treatment regimens.  I elected to treat her with amoxicillin, metronidazole, rifabutin and esomeprazole  October.  She did not have any problems tolerating her regimen and completed all doses.  There has been no change in her baseline nausea and intermittent diarrhea but notes that she is not having any more epigastric pain and is not burping much anymore.  She is off all acid suppressing drugs.  Review of Systems: Review of Systems  Constitutional: Negative for weight loss.  Gastrointestinal: Positive for diarrhea and nausea. Negative for abdominal pain, heartburn and vomiting.    Past Medical History:  Diagnosis Date  . Anxiety   . Depression   . H. pylori infection 2018   treated with abx  . IBS (irritable bowel syndrome)   . OCD (obsessive compulsive disorder)   . Seasonal allergies   . Social anxiety disorder 09/12/2013    Social History   Tobacco Use  . Smoking status: Current Every Day Smoker    Packs/day: 0.50    Types: Cigarettes  . Smokeless tobacco: Never Used  . Tobacco comment: started smoking again 2015  Vaping Use  . Vaping Use: Never used  Substance Use Topics  . Alcohol use: Not Currently    Comment: 0-1 per per day, occasional  . Drug use: No    Family History  Problem Relation Age of Onset  . Depression Mother   . Fibromyalgia Mother   . Breast  cancer Mother 32  . Rheum arthritis Mother   . Alcohol abuse Father   . Emphysema Maternal Grandmother        Dec COPD  . Breast cancer Maternal Grandmother 73  . Multiple sclerosis Maternal Grandfather   . Breast cancer Sister 79  . Breast cancer Paternal Aunt 66       A & W, maternal great aunt  . Crohn's disease Other        maternal gerat aunt  . Rectal cancer Other        maternal third cousin   . Colon cancer Neg Hx   . Esophageal cancer Neg Hx   . Stomach cancer Neg Hx     No Known Allergies  Objective: Vitals:   10/11/20 0836  BP: 113/77  Pulse: 70  Temp: 97.6 F (36.4 C)  TempSrc: Oral  SpO2: 100%  Weight: 189 lb (85.7 kg)  Height: 5\' 3"  (1.6 m)   Body mass index is 33.48  kg/m.  Physical Exam Constitutional:      Comments: She is in good spirits.  Cardiovascular:     Rate and Rhythm: Normal rate.  Pulmonary:     Effort: Pulmonary effort is normal.  Abdominal:     General: There is no distension.     Palpations: Abdomen is soft.     Tenderness: There is no abdominal tenderness.  Psychiatric:        Mood and Affect: Mood normal.     Lab Results    Problem List Items Addressed This Visit      High   H. pylori infection    I am hopeful that her infection has been cured.  I will check a repeat Helicobacter pylori breath test today.      Relevant Orders   H. pylori breath test       Michel Bickers, MD [facility] [place] [phone] pager   [phone] cell 10/11/2020, 8:51 AM

## 2020-10-11 NOTE — Assessment & Plan Note (Signed)
I am hopeful that her infection has been cured.  I will check a repeat Helicobacter pylori breath test today.

## 2020-10-12 LAB — H. PYLORI BREATH TEST: H. pylori Breath Test: NOT DETECTED

## 2020-11-02 DIAGNOSIS — F411 Generalized anxiety disorder: Secondary | ICD-10-CM | POA: Diagnosis not present

## 2020-11-30 DIAGNOSIS — F411 Generalized anxiety disorder: Secondary | ICD-10-CM | POA: Diagnosis not present

## 2021-01-04 DIAGNOSIS — F411 Generalized anxiety disorder: Secondary | ICD-10-CM | POA: Diagnosis not present

## 2021-02-08 DIAGNOSIS — F411 Generalized anxiety disorder: Secondary | ICD-10-CM | POA: Diagnosis not present

## 2021-02-14 ENCOUNTER — Other Ambulatory Visit: Payer: Self-pay | Admitting: Obstetrics and Gynecology

## 2021-02-14 DIAGNOSIS — Z1231 Encounter for screening mammogram for malignant neoplasm of breast: Secondary | ICD-10-CM

## 2021-02-19 NOTE — Progress Notes (Signed)
49 y.o. G30P2002 Divorced White or Caucasian female here for annual exam. 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 Daughter getting married this year in [place].    Feels like she is having some hot flashes, not bad. No menses since January.   Lifetime risk Breast cancer 28%. Sister was 26 when dx Breast CA, Mother dx Breast CA . Had Breast MRI last 01/2020. Planning mammogram this year. Thinking she would like to do MRI again in 2023.  Period Pattern:  (irregular cycle) Menstrual Flow: Light Patient's last menstrual period was 11/08/2020 (exact date).            Sexually active: No.  The current method of family planning is tubal ligation.    Exercising: No.  exercise Smoker:  no  Health Maintenance: Pap:  06-27-16 neg HPV HR neg History of abnormal Pap:  Yes, early 20's had HPV LEEP MMG:  01-06-2020 bilateral & 01-27-2020 rt breast u/s with MRI 02-17-2020 birads 1:neg, 07-27-2020 22mth rt breast u/s birads 3: probably benign, scheduled for 04-05-2021 Colonoscopy:  02-14-13 f/u 20yrs BMD:   none Gardasil:   n/a Covid-19: moderna Hep C testing: neg 2019 Screening Labs: with PCP   reports that she has been smoking cigarettes. She has been smoking about 0.50 packs per day. She has never used smokeless tobacco. She reports previous alcohol use. She reports that she does not use drugs.  Past Medical History:  Diagnosis Date  . Anxiety   . Depression   . H. pylori infection 2018   treated with abx  . IBS (irritable bowel syndrome)   . OCD (obsessive compulsive disorder)   . Seasonal allergies   . Social anxiety disorder 09/12/2013    Past Surgical History:  Procedure Laterality Date  . DILITATION & CURRETTAGE/HYSTROSCOPY WITH NOVASURE ABLATION  09/03/2012   Procedure: DILATATION & CURETTAGE/HYSTEROSCOPY WITH NOVASURE ABLATION;  Surgeon: Daria Pastures, MD;  Location: [facility];  Service: Gynecology;  Laterality: N/A;  . LAPAROSCOPIC TUBAL LIGATION  09/03/2012   Procedure: LAPAROSCOPIC  TUBAL LIGATION;  Surgeon: Daria Pastures, MD;  Location: [facility];  Service: Gynecology;  Laterality: Bilateral;  FILSHIE CLIPS  . LAPAROSCOPY  10/14/2012   Procedure: LAPAROSCOPY OPERATIVE with LSO and LOA;  Surgeon: Farrel Gobble. Harrington Challenger, MD;  Location: [facility];  Service: Gynecology;  Laterality: N/A;  . LAPAROTOMY  10/14/2012   Procedure: EXPLORATORY LAPAROTOMY;  Surgeon: Farrel Gobble. Harrington Challenger, MD;  Location: [facility];  Service: Gynecology;  Laterality: N/A;  . NASAL SEPTUM SURGERY    . OOPHORECTOMY  10/14/2012   Left ovary removed--hx "ruptured chocolate cyst"  . TONSILLECTOMY      Current Outpatient Medications  Medication Sig Dispense Refill  . buPROPion (WELLBUTRIN SR) 150 MG 12 hr tablet 1 tablet a day x 3 days then 1 tablet twice a day, at least 8 hours apart, second dose no later than 6 pm. 60 tablet 2  . fexofenadine (ALLEGRA) 60 MG tablet Take 60 mg by mouth daily.     . propranolol (INDERAL) 10 MG tablet 1 - 2 tablets 30 minutes before social situation 60 tablet 5  . sertraline (ZOLOFT) 100 MG tablet Take 1 tablet (100 mg total) by mouth daily. 90 tablet 3   No current facility-administered medications for this visit.    Family History  Problem Relation Age of Onset  . Depression Mother   . Fibromyalgia Mother   . Breast cancer Mother 60  . Rheum arthritis Mother   . Alcohol abuse Father   .  Emphysema Maternal Grandmother        Dec COPD  . Breast cancer Maternal Grandmother 60       times 2  . Multiple sclerosis Maternal Grandfather   . Breast cancer Sister 41  . Breast cancer Paternal Aunt 72  . Cancer Paternal Uncle   . Diverticulitis Paternal Aunt   . Heart attack Paternal Uncle   . Breast cancer Paternal Aunt     Review of Systems  Constitutional: Negative.   HENT: Negative.   Eyes: Negative.   Respiratory: Negative.   Cardiovascular: Negative.   Gastrointestinal: Negative.   Endocrine: Negative.   Genitourinary:       Irregular cycle  Musculoskeletal: Negative.    Skin: Negative.   Allergic/Immunologic: Negative.   Neurological: Negative.   Hematological: Negative.   Psychiatric/Behavioral: Negative.     Exam:   BP 104/70   Pulse 64   Resp 16   Ht 5\' 3"  (1.6 m)   Wt 169 lb (76.7 kg)   LMP 11/08/2020 (Exact Date)   BMI 29.94 kg/m   Height: 5\' 3"  (160 cm)  General appearance: alert, cooperative and appears stated age, no acute distress Head: Normocephalic, without obvious abnormality Neck: no adenopathy, thyroid normal to inspection and palpation Lungs: clear to auscultation bilaterally Breasts: No axillary or supraclavicular adenopathy, Normal to palpation without dominant masses, glandular, cystic breasts bilaterally Heart: regular rate and rhythm Abdomen: soft, non-tender; no masses,  no organomegaly Extremities: extremities normal, no edema Skin: No rashes or lesions Lymph nodes: Cervical, supraclavicular, and axillary nodes normal. No abnormal inguinal nodes palpated Neurologic: Grossly normal   Pelvic: External genitalia:  no lesions              Urethra:  normal appearing urethra with no masses, tenderness or lesions              Bartholins and Skenes: normal                 Vagina: normal appearing vagina, appropriate for age, discharge noted, no lesions              Cervix: neg cervical motion tenderness, no visible lesions             Bimanual Exam:   Uterus:  ~6-8 week size, normal contour, position, consistency, mobility, non-tender              Adnexa: no mass, fullness, tenderness                 Joy, CMA Chaperone was present for exam.  A:   Well woman exam with routine gynecological exam - Plan: Cytology - PAP( [place])  Vaginal discharge - Plan: WET PREP FOR TRICH, YEAST, CLUE  Yeast detected - Plan: fluconazole (DIFLUCAN) 150 MG tablet  Tobacco use  Lifetime risk of Breast Cancer 28% (Mother and Sister)  Hx LSO r/t chocolate cyst rupture  P:   Pap :cotesting today  Mammogram:scheduled 03/2021, plan  MRI in 2023  Labs: with PCP  Medications: Diflucan (pt will take if feels symptomatic)  Pt plans to be smoke free by daughter's wedding (July 2022)

## 2021-02-22 ENCOUNTER — Ambulatory Visit (INDEPENDENT_AMBULATORY_CARE_PROVIDER_SITE_OTHER): Payer: BC Managed Care – PPO | Admitting: Nurse Practitioner

## 2021-02-22 ENCOUNTER — Encounter: Payer: Self-pay | Admitting: Nurse Practitioner

## 2021-02-22 ENCOUNTER — Other Ambulatory Visit (HOSPITAL_COMMUNITY)
Admission: RE | Admit: 2021-02-22 | Discharge: 2021-02-22 | Disposition: A | Discharge status: Home / Self Care | Payer: BC Managed Care – PPO | Source: Ambulatory Visit | Attending: Nurse Practitioner | Admitting: Nurse Practitioner

## 2021-02-22 ENCOUNTER — Other Ambulatory Visit: Payer: Self-pay

## 2021-02-22 VITALS — BP 104/70 | HR 64 | Resp 16 | Ht 63.0 in | Wt 169.0 lb

## 2021-02-22 DIAGNOSIS — N898 Other specified noninflammatory disorders of vagina: Secondary | ICD-10-CM

## 2021-02-22 DIAGNOSIS — B379 Candidiasis, unspecified: Secondary | ICD-10-CM

## 2021-02-22 DIAGNOSIS — Z01419 Encounter for gynecological examination (general) (routine) without abnormal findings: Secondary | ICD-10-CM | POA: Insufficient documentation

## 2021-02-22 LAB — WET PREP FOR TRICH, YEAST, CLUE

## 2021-02-22 MED ORDER — FLUCONAZOLE 150 MG PO TABS: ORAL_TABLET | ORAL | 0 refills | Status: DC

## 2021-02-24 ENCOUNTER — Encounter: Payer: Self-pay | Admitting: Nurse Practitioner

## 2021-02-24 NOTE — Patient Instructions (Signed)
Health Maintenance, Female Adopting a healthy lifestyle and getting preventive care are important in promoting health and wellness. Ask your health care provider about:  The right schedule for you to have regular tests and exams.  Things you can do on your own to prevent diseases and keep yourself healthy. What should I know about diet, weight, and exercise? Eat a healthy diet  Eat a diet that includes plenty of vegetables, fruits, low-fat dairy products, and lean protein.  Do not eat a lot of foods that are high in solid fats, added sugars, or sodium.   Maintain a healthy weight Body mass index (BMI) is used to identify weight problems. It estimates body fat based on height and weight. Your health care provider can help determine your BMI and help you achieve or maintain a healthy weight. Get regular exercise Get regular exercise. This is one of the most important things you can do for your health. Most adults should:  Exercise for at least 150 minutes each week. The exercise should increase your heart rate and make you sweat (moderate-intensity exercise).  Do strengthening exercises at least twice a week. This is in addition to the moderate-intensity exercise.  Spend less time sitting. Even light physical activity can be beneficial. Watch cholesterol and blood lipids Have your blood tested for lipids and cholesterol at 49 years of age, then have this test every 5 years. Have your cholesterol levels checked more often if:  Your lipid or cholesterol levels are high.  You are older than 49 years of age.  You are at high risk for heart disease. What should I know about cancer screening? Depending on your health history and family history, you may need to have cancer screening at various ages. This may include screening for:  Breast cancer.  Cervical cancer.  Colorectal cancer.  Skin cancer.  Lung cancer. What should I know about heart disease, diabetes, and high blood  pressure? Blood pressure and heart disease  High blood pressure causes heart disease and increases the risk of stroke. This is more likely to develop in people who have high blood pressure readings, are of African descent, or are overweight.  Have your blood pressure checked: ? Every 3-5 years if you are 18-39 years of age. ? Every year if you are 40 years old or older. Diabetes Have regular diabetes screenings. This checks your fasting blood sugar level. Have the screening done:  Once every three years after age 40 if you are at a normal weight and have a low risk for diabetes.  More often and at a younger age if you are overweight or have a high risk for diabetes. What should I know about preventing infection? Hepatitis B If you have a higher risk for hepatitis B, you should be screened for this virus. Talk with your health care provider to find out if you are at risk for hepatitis B infection. Hepatitis C Testing is recommended for:  Everyone born from 1945 through 1965.  Anyone with known risk factors for hepatitis C. Sexually transmitted infections (STIs)  Get screened for STIs, including gonorrhea and chlamydia, if: ? You are sexually active and are younger than 49 years of age. ? You are older than 49 years of age and your health care provider tells you that you are at risk for this type of infection. ? Your sexual activity has changed since you were last screened, and you are at increased risk for chlamydia or gonorrhea. Ask your health care provider   if you are at risk.  Ask your health care provider about whether you are at high risk for HIV. Your health care provider may recommend a prescription medicine to help prevent HIV infection. If you choose to take medicine to prevent HIV, you should first get tested for HIV. You should then be tested every 3 months for as long as you are taking the medicine. Pregnancy  If you are about to stop having your period (premenopausal) and  you may become pregnant, seek counseling before you get pregnant.  Take 400 to 800 micrograms (mcg) of folic acid every day if you become pregnant.  Ask for birth control (contraception) if you want to prevent pregnancy. Osteoporosis and menopause Osteoporosis is a disease in which the bones lose minerals and strength with aging. This can result in bone fractures. If you are 65 years old or older, or if you are at risk for osteoporosis and fractures, ask your health care provider if you should:  Be screened for bone loss.  Take a calcium or vitamin D supplement to lower your risk of fractures.  Be given hormone replacement therapy (HRT) to treat symptoms of menopause. Follow these instructions at home: Lifestyle  Do not use any products that contain nicotine or tobacco, such as cigarettes, e-cigarettes, and chewing tobacco. If you need help quitting, ask your health care provider.  Do not use street drugs.  Do not share needles.  Ask your health care provider for help if you need support or information about quitting drugs. Alcohol use  Do not drink alcohol if: ? Your health care provider tells you not to drink. ? You are pregnant, may be pregnant, or are planning to become pregnant.  If you drink alcohol: ? Limit how much you use to 0-1 drink a day. ? Limit intake if you are breastfeeding.  Be aware of how much alcohol is in your drink. In the [place] one drink equals one 12 oz bottle of beer (355 mL), one 5 oz glass of wine (148 mL), or one 1 oz glass of hard liquor (44 mL). General instructions  Schedule regular health, dental, and eye exams.  Stay current with your vaccines.  Tell your health care provider if: ? You often feel depressed. ? You have ever been abused or do not feel safe at home. Summary  Adopting a healthy lifestyle and getting preventive care are important in promoting health and wellness.  Follow your health care provider's instructions about healthy  diet, exercising, and getting tested or screened for diseases.  Follow your health care provider's instructions on monitoring your cholesterol and blood pressure. This information is not intended to replace advice given to you by your health care provider. Make sure you discuss any questions you have with your health care provider. Document Revised: 09/29/2018 Document Reviewed: 09/29/2018 Elsevier Patient Education  2021 Elsevier Inc.  

## 2021-02-26 LAB — CYTOLOGY - PAP
Comment: NEGATIVE
Diagnosis: NEGATIVE
High risk HPV: NEGATIVE

## 2021-04-05 ENCOUNTER — Ambulatory Visit
Admission: RE | Admit: 2021-04-05 | Discharge: 2021-04-05 | Disposition: A | Discharge status: Home / Self Care | Payer: BC Managed Care – PPO | Source: Ambulatory Visit | Attending: Obstetrics and Gynecology | Admitting: Obstetrics and Gynecology

## 2021-04-05 ENCOUNTER — Other Ambulatory Visit: Payer: Self-pay | Admitting: Obstetrics and Gynecology

## 2021-04-05 ENCOUNTER — Other Ambulatory Visit: Payer: Self-pay

## 2021-04-05 DIAGNOSIS — N6489 Other specified disorders of breast: Secondary | ICD-10-CM

## 2021-04-05 DIAGNOSIS — Z1231 Encounter for screening mammogram for malignant neoplasm of breast: Secondary | ICD-10-CM

## 2021-04-06 ENCOUNTER — Ambulatory Visit
Admission: RE | Admit: 2021-04-06 | Discharge: 2021-04-06 | Disposition: A | Discharge status: Home / Self Care | Payer: BC Managed Care – PPO | Source: Ambulatory Visit | Attending: Obstetrics and Gynecology | Admitting: Obstetrics and Gynecology

## 2021-04-06 DIAGNOSIS — R922 Inconclusive mammogram: Secondary | ICD-10-CM | POA: Diagnosis not present

## 2021-04-06 DIAGNOSIS — N6489 Other specified disorders of breast: Secondary | ICD-10-CM

## 2021-04-06 DIAGNOSIS — Z803 Family history of malignant neoplasm of breast: Secondary | ICD-10-CM | POA: Diagnosis not present

## 2021-04-06 IMAGING — MG DIGITAL DIAGNOSTIC BILAT W/ TOMO W/ CAD
8 series · 8 of 24 positions shown · non-contrast
Comparison: Previous exams.

CLINICAL DATA: Follow-up for probably benign right breast
asymmetry.

EXAM:
DIGITAL DIAGNOSTIC BILATERAL MAMMOGRAM WITH TOMOSYNTHESIS AND CAD
TECHNIQUE: Bilateral digital diagnostic mammography and breast tomosynthesis
was performed. The images were evaluated with computer-aided
detection.

[L CC synth-2D]
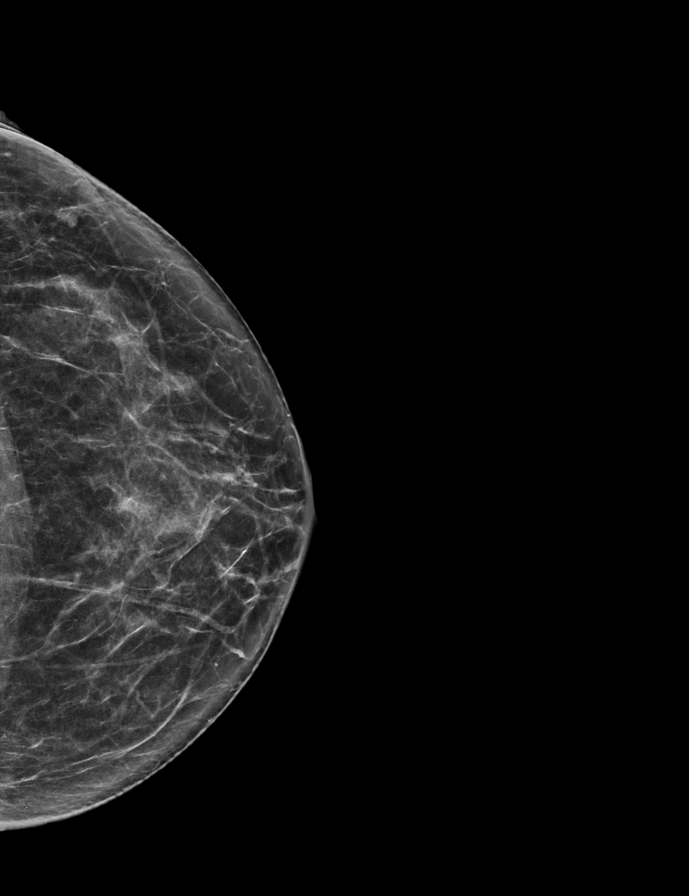

[L MLO synth-2D]
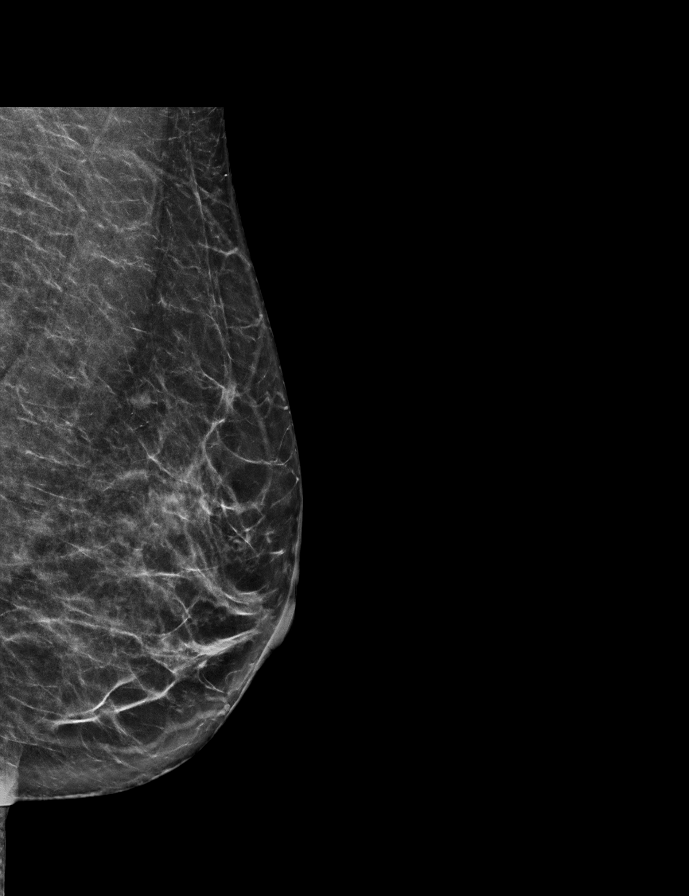

[R CC synth-2D]
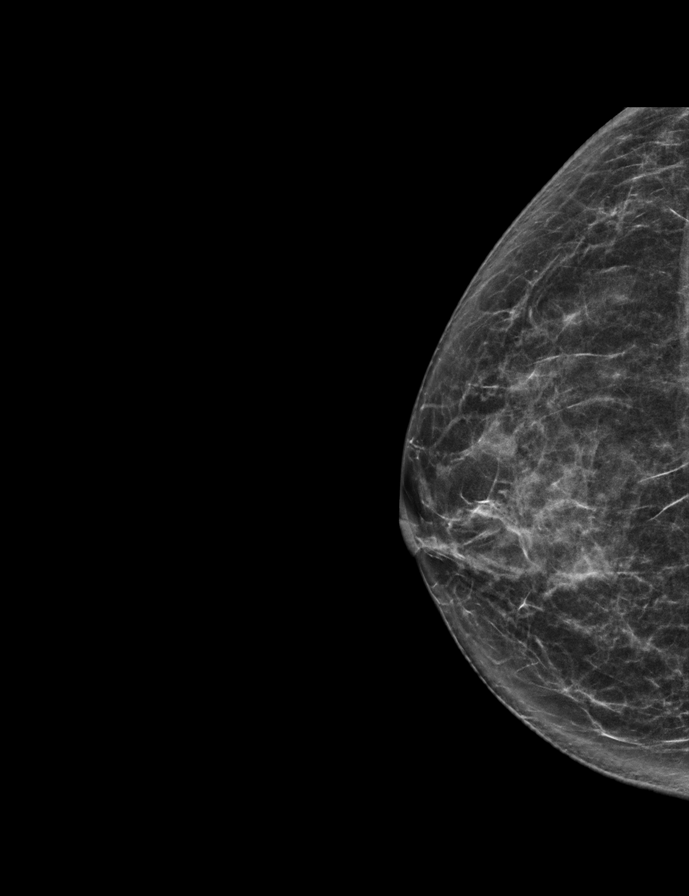

[R MLO synth-2D]
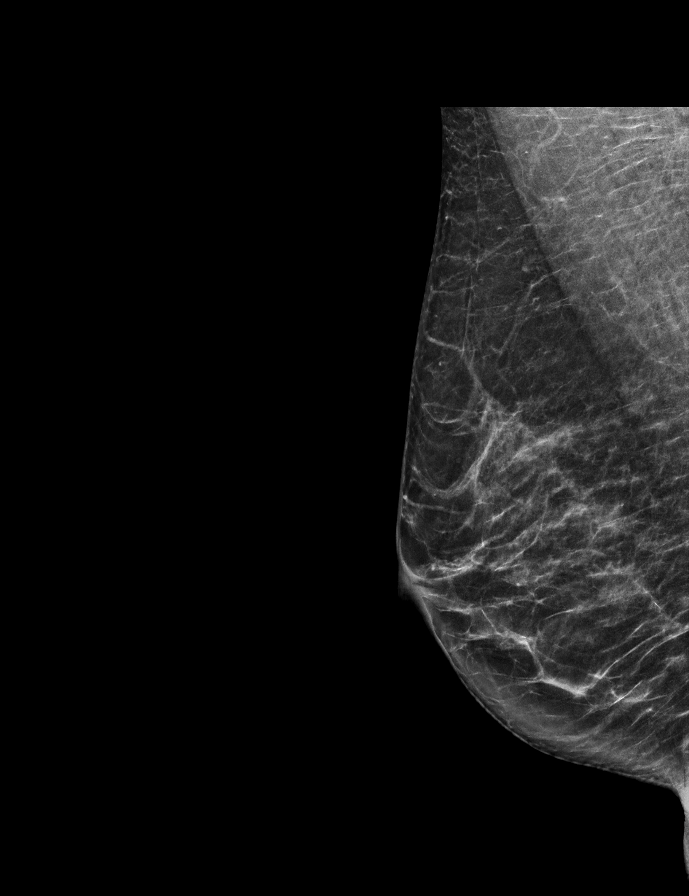

[R CC tomo · tomo slice 29/57.0]
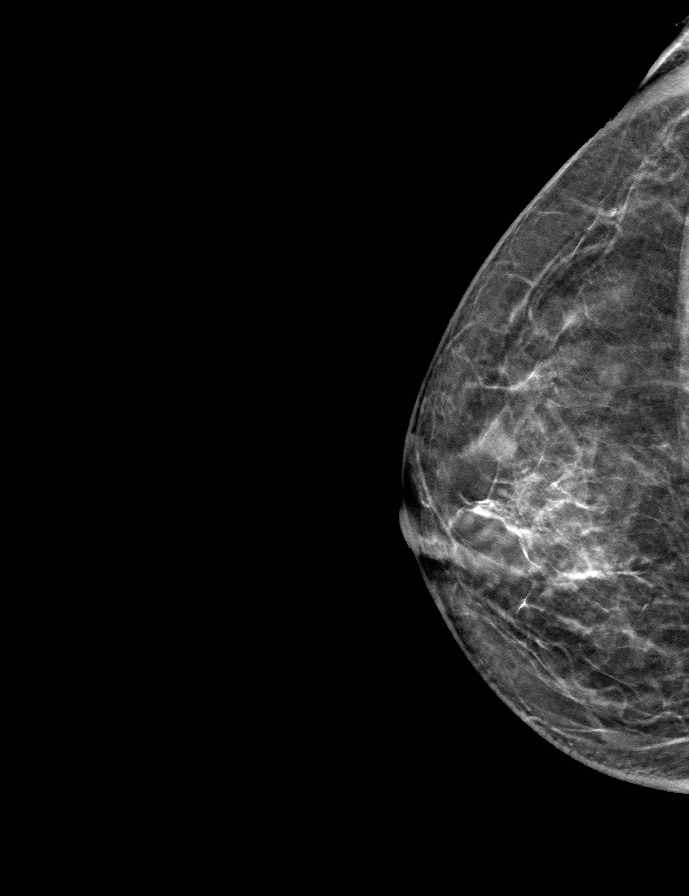

[L MLO tomo · tomo slice 31/62.0]
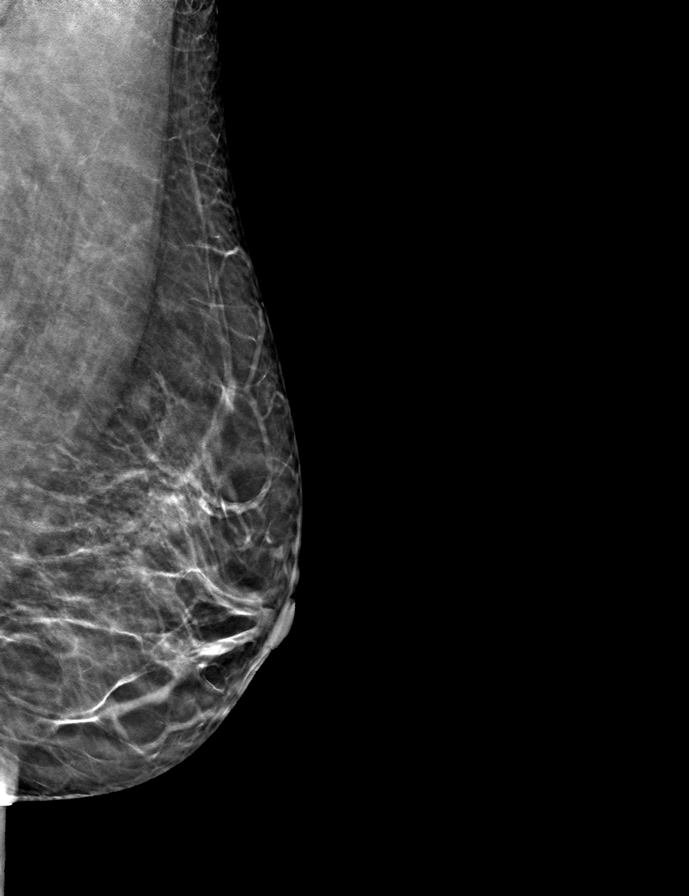

[R MLO tomo · tomo slice 30/59.0]
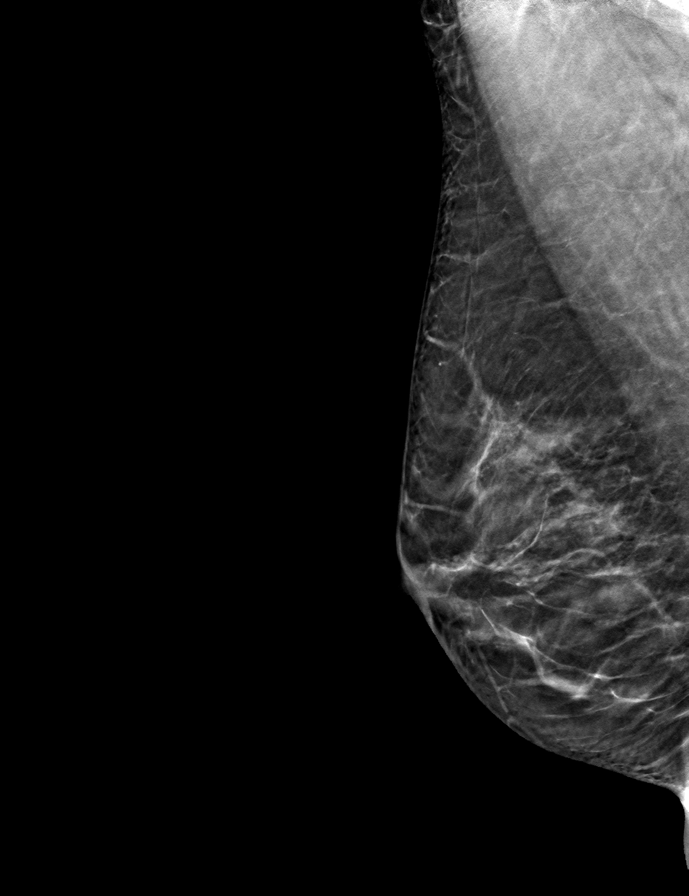

[L CC tomo · tomo slice 29/58.0]
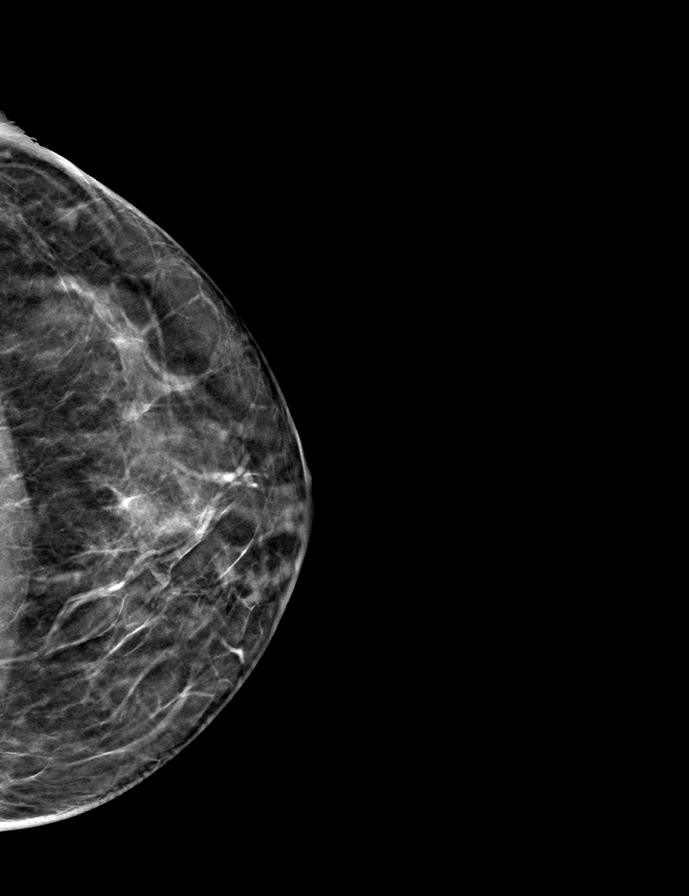

[8 of 24 positions shown; findings below may reference images not displayed]

ACR Breast Density Category b: There are scattered areas of
fibroglandular density.
FINDINGS: No suspicious masses or calcifications are seen in either breast.
The asymmetry in the superior right breast mid to posterior depth
appears unchanged and is felt to be related to normal/benign breast
tissue. There is no mammographic evidence of malignancy in either
breast.
IMPRESSION: Stable probably benign right breast asymmetry. No mammographic
evidence of malignancy in either breast.

RECOMMENDATION:
1. Recommend bilateral diagnostic mammography in 1 year which will
demonstrate 2 years of stability of the probably benign right breast
asymmetry.

2. Strong family history of breast cancer, including in mother and
maternal grandmother as well as sister diagnosed with breast cancer
at age 48. Also with history of breast cancer in 2 paternal aunts.
The American Cancer Society recommends annual MRI and mammography in
patients with an estimated lifetime risk of developing breast cancer
greater than 20 - 25%, or who are known or suspected to be positive
for the breast cancer gene.

I have discussed the findings and recommendations with the patient.
If applicable, a reminder letter will be sent to the patient
regarding the next appointment.

BI-RADS CATEGORY  3: Probably benign.

## 2021-05-31 DIAGNOSIS — F411 Generalized anxiety disorder: Secondary | ICD-10-CM | POA: Diagnosis not present

## 2021-06-26 ENCOUNTER — Telehealth: Payer: Self-pay

## 2021-06-26 NOTE — Telephone Encounter (Signed)
Received access nurse message about needing appointment for rash. Reviewed chart has one made with [facility] next week. Called l/m to call office. Need to see if patient would like to look at being seen sooner? Access nurse added to this note.    [facility] Night - Client Nonclinical Telephone Record  Access[facility]urse Client [facility] Night - Client Client Site [place] Physician Owens Loffler - MD Contact Type Call Who Is Calling Patient / Member / Family / Caregiver Caller Name Amy Marks Caller Phone Number [phone] Patient Name Amy Marks Patient DOB 04-25-1971 Call Type Message Only Information Provided Reason for Call Request to Schedule Office Appointment Initial Comment The caller states that she has a rash on her torso for the past two weeks and needs to schedule an appointment to be seen for this. Patient request to speak to RN No Additional Comment The caller declined to speak to a triage nurse and would greatly appreciate a call back from the office. Disp. Time Disposition Final User 06/26/2021 1:24:39 PM General Information Provided Yes Milton Ferguson Call Closed By: Milton Ferguson Transaction Date/Time: 06/26/2021 1:16:01 PM (ET

## 2021-07-02 ENCOUNTER — Ambulatory Visit: Payer: BC Managed Care – PPO | Admitting: Nurse Practitioner

## 2021-07-05 ENCOUNTER — Ambulatory Visit: Payer: BC Managed Care – PPO | Admitting: Nurse Practitioner

## 2021-07-05 ENCOUNTER — Other Ambulatory Visit: Payer: Self-pay

## 2021-07-05 VITALS — BP 118/72 | HR 66 | Temp 97.8°F | Resp 10 | Ht 62.75 in | Wt 145.2 lb

## 2021-07-05 DIAGNOSIS — R600 Localized edema: Secondary | ICD-10-CM | POA: Insufficient documentation

## 2021-07-05 DIAGNOSIS — M654 Radial styloid tenosynovitis [de Quervain]: Secondary | ICD-10-CM

## 2021-07-05 DIAGNOSIS — R21 Rash and other nonspecific skin eruption: Secondary | ICD-10-CM

## 2021-07-05 MED ORDER — LEVOCETIRIZINE DIHYDROCHLORIDE 5 MG PO TABS
5.0000 mg | ORAL_TABLET | Freq: Every evening | ORAL | 3 refills | Status: DC
Start: 2021-07-05 — End: 2021-09-27

## 2021-07-05 MED ORDER — ERYTHROMYCIN 5 MG/GM OP OINT: 1.0000 "application " | TOPICAL_OINTMENT | Freq: Three times a day (TID) | OPHTHALMIC | 0 refills | Status: DC

## 2021-07-05 NOTE — Assessment & Plan Note (Signed)
States intermittent periods of periorbital edema.  States her eyes are itching.  Has seen eye doctor in the past and gave her polymyxin and antihistamine eyedrops that seem to help.  Did go over hand hygiene and touching eyes.  Patient to avoid scratching her eyes if possible that can cause some of the swelling.  We will go ahead and treat with erythromycin ointment just to make sure not an infectious cause.  Patient will continue to monitor. Start erythromycin ointment as directed also start levocetirizine 5 mg once daily.

## 2021-07-05 NOTE — Assessment & Plan Note (Signed)
Rash has almost completely resolved per physical exam.  Patient has been using over-the-counter Eucerin lotion that seems to be beneficial.  Continue using lotion, continue monitoring.

## 2021-07-05 NOTE — Patient Instructions (Signed)
Nice to see you today Attached some information in regards to your hand/wrist. Will send in eye medication to use

## 2021-07-05 NOTE — Assessment & Plan Note (Signed)
Patient does work as a Copywriter, advertising.  States she is right-hand dominant.  States sometimes when she is at work the discomfort is worse when she is holding her to, she will have to reposition her hand to get some relief.  Did have a positive Finkelstein test in office.  We will treat conservatively at this time given over-the-counter recommendations of using anti-inflammatory medications and splinting.  Patient states she has a splint at home.  Information in regards to Early.

## 2021-07-05 NOTE — Progress Notes (Signed)
Acute Office Visit  Subjective:    Patient ID: Amy Marks, female    DOB: 08/03/1971, 50 y.o.   MRN: [id number]  Chief Complaint  Patient presents with   Rash    X 3 weeks-getting better. Location: abdomen, back all around and some on the lower abdomen area/pelvic area. Has been itching a lot but better now.   Hand Pain    Right thumb pain, for a long time. Pain radiates to the right wrist. She is a Human resources officer.   Eye Problem    Bilateral eyelid swelling, irritated eyes and eyelids. Some redness present.Has used in the past Olopatadine drops and Neomycin and polymyxin ointment. Off and on for a while and most recent happened last night.    HPI Patient is in today for   Rash: States it has happened in the past. States happened approx 3 has improved. States that it does itch. She tried benadryl, cortisone, calamine lotion, got eucrine lotion that seemed to help.  Thumb pain: Right thumb pain that has been going on for a few months. States intermitent worse with movement. Has not tried over the counter. She does work as a Copywriter, advertising and movement makes it worse  Eye lid swelling: States that she has had episodes. Went ot eye doctor prescribed two medications. Does state it  Past Medical History:  Diagnosis Date   Anxiety    Depression    H. pylori infection 2018   treated with abx   IBS (irritable bowel syndrome)    OCD (obsessive compulsive disorder)    Seasonal allergies    Social anxiety disorder 09/12/2013    Past Surgical History:  Procedure Laterality Date   DILITATION & CURRETTAGE/HYSTROSCOPY WITH NOVASURE ABLATION  09/03/2012   Procedure: DILATATION & CURETTAGE/HYSTEROSCOPY WITH NOVASURE ABLATION;  Surgeon: Daria Pastures, MD;  Location: [facility];  Service: Gynecology;  Laterality: N/A;   LAPAROSCOPIC TUBAL LIGATION  09/03/2012   Procedure: LAPAROSCOPIC TUBAL LIGATION;  Surgeon: Daria Pastures, MD;  Location: [facility];  Service: Gynecology;  Laterality:  Bilateral;  FILSHIE CLIPS   LAPAROSCOPY  10/14/2012   Procedure: LAPAROSCOPY OPERATIVE with LSO and LOA;  Surgeon: Farrel Gobble. Harrington Challenger, MD;  Location: [facility];  Service: Gynecology;  Laterality: N/A;   LAPAROTOMY  10/14/2012   Procedure: EXPLORATORY LAPAROTOMY;  Surgeon: Farrel Gobble. Harrington Challenger, MD;  Location: [facility];  Service: Gynecology;  Laterality: N/A;   NASAL SEPTUM SURGERY     OOPHORECTOMY  10/14/2012   Left ovary removed--hx "ruptured chocolate cyst"   TONSILLECTOMY      Family History  Problem Relation Age of Onset   Depression Mother    Fibromyalgia Mother    Breast cancer Mother 49   Rheum arthritis Mother    Alcohol abuse Father    Emphysema Maternal Grandmother        Dec COPD   Breast cancer Maternal Grandmother 60       times 2   Multiple sclerosis Maternal Grandfather    Breast cancer Sister 64   Breast cancer Paternal Aunt 77   Cancer Paternal Uncle    Diverticulitis Paternal Aunt    Heart attack Paternal Uncle    Breast cancer Paternal Aunt     Social History   Socioeconomic History   Marital status: Divorced    Spouse name: Not on file   Number of children: 2   Years of education: Not on file   Highest education level: Not on file  Occupational  History   Occupation: DENTAL HYGIENST    Employer: [employer]  Tobacco Use   Smoking status: Every Day    Packs/day: 0.50    Types: Cigarettes   Smokeless tobacco: Never   Tobacco comments:    started smoking again 2015  Vaping Use   Vaping Use: Never used  Substance and Sexual Activity   Alcohol use: Not Currently   Drug use: Never   Sexual activity: Not Currently    Birth control/protection: Surgical    Comment: Tubal  Other Topics Concern   Not on file  Social History Narrative   Divorced. Two children, daughter and son. Works as a Copywriter, advertising.    Social Determinants of Radio broadcast assistant Strain: Not on file  Food Insecurity: Not on file  Transportation Needs: Not on file  Physical  Activity: Not on file  Stress: Not on file  Social Connections: Not on file  Intimate Partner Violence: Not on file    Outpatient Medications Prior to Visit  Medication Sig Dispense Refill   fexofenadine (ALLEGRA) 60 MG tablet Take 60 mg by mouth daily.      propranolol (INDERAL) 10 MG tablet 1 - 2 tablets 30 minutes before social situation 60 tablet 5   sertraline (ZOLOFT) 100 MG tablet Take 1 tablet (100 mg total) by mouth daily. 90 tablet 3   buPROPion (WELLBUTRIN SR) 150 MG 12 hr tablet 1 tablet a day x 3 days then 1 tablet twice a day, at least 8 hours apart, second dose no later than 6 pm. (Patient not taking: Reported on 07/05/2021) 60 tablet 2   fluconazole (DIFLUCAN) 150 MG tablet Take 1 po today and the second pill in 3 days 2 tablet 0   No facility-administered medications prior to visit.    No Known Allergies  Review of Systems  Constitutional:  Negative for chills and fever.  Eyes:  Positive for itching. Negative for pain, discharge, redness and visual disturbance.  Respiratory:  Negative for shortness of breath.   Cardiovascular:  Negative for chest pain.  Gastrointestinal:  Negative for diarrhea, nausea and vomiting.  Musculoskeletal:  Positive for arthralgias and myalgias.  Skin:  Positive for rash.  Neurological:  Negative for weakness and numbness.      Objective:    Physical Exam Vitals and nursing note reviewed.  Constitutional:      Appearance: Normal appearance.  Eyes:     Extraocular Movements: Extraocular movements intact.     Pupils: Pupils are equal, round, and reactive to light.     Comments: Swelling of bilateral upper eyelids. EOM intact. No reddness or eye pain. Described as gritty or dry feeling.  Cardiovascular:     Rate and Rhythm: Normal rate and regular rhythm.  Pulmonary:     Effort: Pulmonary effort is normal.     Breath sounds: Normal breath sounds.  Abdominal:     General: Bowel sounds are normal.  Musculoskeletal:        General:  Tenderness present.     Comments: + finkelstien sign on right side. - snuffbox tenderness No erythema, edema, or  ecchymosis.  Lymphadenopathy:     Cervical: No cervical adenopathy.  Skin:    General: Skin is warm.     Comments: Unable to see confluent rash. Did see small spread out healing "bites" on patients left side torso.  Neurological:     Mental Status: She is alert.     Comments: Bilateral upper extremities 5/5 strength  Psychiatric:        Mood and Affect: Mood normal.        Behavior: Behavior normal.        Thought Content: Thought content normal.        Judgment: Judgment normal.   BP 118/72   Pulse 66   Temp 97.8 F (36.6 C)   Resp 10   Ht 5' 2.75" (1.594 m)   Wt 145 lb 4 oz (65.9 kg)   BMI 25.94 kg/m  Wt Readings from Last 3 Encounters:  07/05/21 145 lb 4 oz (65.9 kg)  02/22/21 169 lb (76.7 kg)  10/11/20 189 lb (85.7 kg)    Health Maintenance Due  Topic Date Due   Pneumococcal Vaccine 59-49 Years old (1 - PCV) Never done   COVID-19 Vaccine (2 - Moderna series) 11/22/2019    There are no preventive care reminders to display for this patient.   Lab Results  Component Value Date   TSH 1.76 03/02/2020   Lab Results  Component Value Date   WBC 6.7 03/02/2020   HGB 14.6 03/02/2020   HCT 42.0 03/02/2020   MCV 91.0 03/02/2020   PLT 186.0 03/02/2020   Lab Results  Component Value Date   NA 135 03/02/2020   K 4.4 03/02/2020   CO2 28 03/02/2020   GLUCOSE 93 03/02/2020   BUN 14 03/02/2020   CREATININE 0.84 03/02/2020   BILITOT 0.6 03/02/2020   ALKPHOS 65 03/02/2020   AST 17 03/02/2020   ALT 19 03/02/2020   PROT 7.2 03/02/2020   ALBUMIN 4.5 03/02/2020   CALCIUM 9.3 03/02/2020   GFR 72.54 03/02/2020   Lab Results  Component Value Date   CHOL 188 08/10/2020   Lab Results  Component Value Date   HDL 56.20 08/10/2020   Lab Results  Component Value Date   LDLCALC 123 (H) 08/10/2020   Lab Results  Component Value Date   TRIG 42.0  08/10/2020   Lab Results  Component Value Date   CHOLHDL 3 08/10/2020   Lab Results  Component Value Date   HGBA1C 5.2 08/10/2020       Assessment & Plan:   Problem List Items Addressed This Visit       Musculoskeletal and Integument   Rash    Rash has almost completely resolved per physical exam.  Patient has been using over-the-counter Eucerin lotion that seems to be beneficial.  Continue using lotion, continue monitoring.      De Quervain's disease (tenosynovitis) - Primary    Patient does work as a Copywriter, advertising.  States she is right-hand dominant.  States sometimes when she is at work the discomfort is worse when she is holding her to, she will have to reposition her hand to get some relief.  Did have a positive Finkelstein test in office.  We will treat conservatively at this time given over-the-counter recommendations of using anti-inflammatory medications and splinting.  Patient states she has a splint at home.  Information in regards to [place].        Other   Periorbital edema    States intermittent periods of periorbital edema.  States her eyes are itching.  Has seen eye doctor in the past and gave her polymyxin and antihistamine eyedrops that seem to help.  Did go over hand hygiene and touching eyes.  Patient to avoid scratching her eyes if possible that can cause some of the swelling.  We will go ahead and treat with erythromycin ointment just  to make sure not an infectious cause.  Patient will continue to monitor. Start erythromycin ointment as directed also start levocetirizine 5 mg once daily.      Relevant Medications   levocetirizine (XYZAL) 5 MG tablet   erythromycin ophthalmic ointment     No orders of the defined types were placed in this encounter.  This visit occurred during the SARS-CoV-2 public health emergency.  Safety protocols were in place, including screening questions prior to the visit, additional usage of staff PPE, and extensive  cleaning of exam room while observing appropriate contact time as indicated for disinfecting solutions.   Romilda Garret, NP

## 2021-08-16 ENCOUNTER — Encounter: Payer: BC Managed Care – PPO | Admitting: Nurse Practitioner

## 2021-09-06 ENCOUNTER — Encounter: Payer: BC Managed Care – PPO | Admitting: Nurse Practitioner

## 2021-09-27 ENCOUNTER — Other Ambulatory Visit: Payer: Self-pay

## 2021-09-27 ENCOUNTER — Ambulatory Visit (INDEPENDENT_AMBULATORY_CARE_PROVIDER_SITE_OTHER): Payer: BC Managed Care – PPO | Admitting: Nurse Practitioner

## 2021-09-27 ENCOUNTER — Encounter: Payer: Self-pay | Admitting: Nurse Practitioner

## 2021-09-27 VITALS — BP 106/72 | HR 81 | Temp 97.7°F | Resp 12 | Ht 63.5 in | Wt 151.2 lb

## 2021-09-27 DIAGNOSIS — Z720 Tobacco use: Secondary | ICD-10-CM

## 2021-09-27 DIAGNOSIS — F419 Anxiety disorder, unspecified: Secondary | ICD-10-CM

## 2021-09-27 DIAGNOSIS — R5383 Other fatigue: Secondary | ICD-10-CM | POA: Diagnosis not present

## 2021-09-27 DIAGNOSIS — Z0000 Encounter for general adult medical examination without abnormal findings: Secondary | ICD-10-CM | POA: Diagnosis not present

## 2021-09-27 DIAGNOSIS — F32A Depression, unspecified: Secondary | ICD-10-CM

## 2021-09-27 DIAGNOSIS — Z9189 Other specified personal risk factors, not elsewhere classified: Secondary | ICD-10-CM

## 2021-09-27 DIAGNOSIS — R232 Flushing: Secondary | ICD-10-CM | POA: Insufficient documentation

## 2021-09-27 LAB — COMPREHENSIVE METABOLIC PANEL WITH GFR
ALT: 16 U/L (ref 0–35)
AST: 17 U/L (ref 0–37)
Albumin: 4.5 g/dL (ref 3.5–5.2)
Alkaline Phosphatase: 52 U/L (ref 39–117)
BUN: 19 mg/dL (ref 6–23)
CO2: 28 meq/L (ref 19–32)
Calcium: 9.8 mg/dL (ref 8.4–10.5)
Chloride: 103 meq/L (ref 96–112)
Creatinine, Ser: 0.86 mg/dL (ref 0.40–1.20)
GFR: 79.56 mL/min
Glucose, Bld: 84 mg/dL (ref 70–99)
Potassium: 5.2 meq/L — ABNORMAL HIGH (ref 3.5–5.1)
Sodium: 138 meq/L (ref 135–145)
Total Bilirubin: 0.5 mg/dL (ref 0.2–1.2)
Total Protein: 7.2 g/dL (ref 6.0–8.3)

## 2021-09-27 LAB — LIPID PANEL
Cholesterol: 201 mg/dL — ABNORMAL HIGH (ref 0–200)
HDL: 71.7 mg/dL (ref >39.00)
LDL Cholesterol: 122 mg/dL — ABNORMAL HIGH (ref 0–99)
NonHDL: 129.36
Total CHOL/HDL Ratio: 3
Triglycerides: 38 mg/dL (ref 0.0–149.0)
VLDL: 7.6 mg/dL (ref 0.0–40.0)

## 2021-09-27 LAB — HEMOGLOBIN A1C: Hgb A1c MFr Bld: 5.3 % (ref 4.6–6.5)

## 2021-09-27 LAB — CBC
HCT: 43.1 % (ref 36.0–46.0)
Hemoglobin: 14.9 g/dL (ref 12.0–15.0)
MCHC: 34.6 g/dL (ref 30.0–36.0)
MCV: 92 fl (ref 78.0–100.0)
Platelets: 165 K/uL (ref 150.0–400.0)
RBC: 4.69 Mil/uL (ref 3.87–5.11)
RDW: 12.7 % (ref 11.5–15.5)
WBC: 4.9 K/uL (ref 4.0–10.5)

## 2021-09-27 LAB — TSH: TSH: 1.54 u[IU]/mL (ref 0.35–5.50)

## 2021-09-27 MED ORDER — SERTRALINE HCL 100 MG PO TABS: 100.0000 mg | ORAL_TABLET | Freq: Every day | ORAL | 3 refills | Status: DC

## 2021-09-27 NOTE — Assessment & Plan Note (Signed)
Patient has quit smoking in the past.  States she started back once she was going to divorce approximate 7 years ago.  Has quit in the past using cold [place] method.  Does have some bupropion at home from previous provider.  She is planning on starting a medication in the first of the year.  Discussed increased risk of cancers while smoking.  Patient knowledge

## 2021-09-27 NOTE — Assessment & Plan Note (Addendum)
Did discuss with patient appropriate immunizations and screening exams for age.  Encouraged healthy lifestyle

## 2021-09-27 NOTE — Patient Instructions (Signed)
Nice to see you today Try to work on stopping smoking as it increase risks for cancer Will see you in a year unless you need me before

## 2021-09-27 NOTE — Assessment & Plan Note (Signed)
Strong family history of breast cancer patient will need a bilateral diagnostic mammogram in year 2023.

## 2021-09-27 NOTE — Assessment & Plan Note (Signed)
Likely related to hot flashes that woke patient up throughout the night.  We will check TSH pending results

## 2021-09-27 NOTE — Assessment & Plan Note (Signed)
Currently maintained on sertraline 100 mg.  She denies SI/HI/AVH.  PHQ-9 and GAD-7 administered in office.  Continue sertraline 100 mg

## 2021-09-27 NOTE — Progress Notes (Signed)
Established Patient Office Visit  Subjective:  Patient ID: Amy Marks, female    DOB: 25-Apr-1971  Age: 50 y.o. MRN: [id number]  CC:  Chief Complaint  Patient presents with   Annual Exam    Sees gynecologist for PAP smears    HPI Amy Marks presents for complete physical and follow up of chronic conditions.  Immunizations: -Tetanus: 06/27/2016 -Influenza: offered in office -Covid-19: Moderna x 1 -Shingles: NA -Pneumonia: NA  -HPV: NA  Diet: Fair diet.   Eye exam: Completes annually  Dental exam: Completes semi-annually   Pap Smear: Completed in 02/22/2021. Amy Ganja, NP Mammogram: Completed in  04/06/2021 Need bilateral diagnostic next year on 03/2022 Dexa: NA Colonoscopy: Completed in 2014 recall in 2024   Lung Cancer Screening: 0.5ppd  25-30 years of smoking. Has smoked since 50 years old and quit several times for years at at time. Been smoking for 7 years now. Has welbutrion that is is wanting to start after the first of the year.    Past Medical History:  Diagnosis Date   Anxiety    Depression    H. pylori infection 2018   treated with abx   IBS (irritable bowel syndrome)    OCD (obsessive compulsive disorder)    Seasonal allergies    Social anxiety disorder 09/12/2013    Past Surgical History:  Procedure Laterality Date   DILITATION & CURRETTAGE/HYSTROSCOPY WITH NOVASURE ABLATION  09/03/2012   Procedure: DILATATION & CURETTAGE/HYSTEROSCOPY WITH NOVASURE ABLATION;  Surgeon: Daria Pastures, MD;  Location: [facility];  Service: Gynecology;  Laterality: N/A;   LAPAROSCOPIC TUBAL LIGATION  09/03/2012   Procedure: LAPAROSCOPIC TUBAL LIGATION;  Surgeon: Daria Pastures, MD;  Location: [facility];  Service: Gynecology;  Laterality: Bilateral;  FILSHIE CLIPS   LAPAROSCOPY  10/14/2012   Procedure: LAPAROSCOPY OPERATIVE with LSO and LOA;  Surgeon: Farrel Gobble. Harrington Challenger, MD;  Location: [facility];  Service: Gynecology;  Laterality: N/A;   LAPAROTOMY  10/14/2012    Procedure: EXPLORATORY LAPAROTOMY;  Surgeon: Farrel Gobble. Harrington Challenger, MD;  Location: [facility];  Service: Gynecology;  Laterality: N/A;   NASAL SEPTUM SURGERY     OOPHORECTOMY  10/14/2012   Left ovary removed--hx "ruptured chocolate cyst"   TONSILLECTOMY      Family History  Problem Relation Age of Onset   Depression Mother    Fibromyalgia Mother    Breast cancer Mother 20   Rheum arthritis Mother    Alcohol abuse Father    Emphysema Maternal Grandmother        Dec COPD   Breast cancer Maternal Grandmother 60       times 2   Multiple sclerosis Maternal Grandfather    Breast cancer Sister 58   Breast cancer Paternal Aunt 27   Cancer Paternal Uncle    Diverticulitis Paternal Aunt    Heart attack Paternal Uncle    Breast cancer Paternal Aunt     Social History   Socioeconomic History   Marital status: Divorced    Spouse name: Not on file   Number of children: 2   Years of education: Not on file   Highest education level: Not on file  Occupational History   Occupation: [place]    Employer: [employer]  Tobacco Use   Smoking status: Every Day    Packs/day: 0.50    Types: Cigarettes   Smokeless tobacco: Never   Tobacco comments:    started smoking again 2015  Vaping Use   Vaping Use:  Never used  Substance and Sexual Activity   Alcohol use: Not Currently   Drug use: Never   Sexual activity: Not Currently    Birth control/protection: Surgical    Comment: Tubal  Other Topics Concern   Not on file  Social History Narrative   Divorced. Two children, daughter and son. Works as a Copywriter, advertising.    Social Determinants of Radio broadcast assistant Strain: Not on file  Food Insecurity: Not on file  Transportation Needs: Not on file  Physical Activity: Not on file  Stress: Not on file  Social Connections: Not on file  Intimate Partner Violence: Not on file    Outpatient Medications Prior to Visit  Medication Sig Dispense Refill   propranolol (INDERAL) 10 MG  tablet 1 - 2 tablets 30 minutes before social situation 60 tablet 5   sertraline (ZOLOFT) 100 MG tablet Take 1 tablet (100 mg total) by mouth daily. 90 tablet 3   buPROPion (WELLBUTRIN SR) 150 MG 12 hr tablet 1 tablet a day x 3 days then 1 tablet twice a day, at least 8 hours apart, second dose no later than 6 pm. (Patient not taking: Reported on 07/05/2021) 60 tablet 2   erythromycin ophthalmic ointment Place 1 application into both eyes 3 (three) times daily. For 5 days 3.5 g 0   levocetirizine (XYZAL) 5 MG tablet Take 1 tablet (5 mg total) by mouth every evening. 30 tablet 3   No facility-administered medications prior to visit.    No Known Allergies  ROS Review of Systems  Constitutional:  Positive for fatigue. Negative for chills and fever.  HENT:  Negative for congestion, ear discharge, ear pain and sore throat.   Respiratory:  Negative for cough and shortness of breath.   Cardiovascular:  Negative for chest pain, palpitations and leg swelling.  Gastrointestinal:  Negative for abdominal pain, blood in stool, constipation, diarrhea, nausea and vomiting.  Genitourinary:  Negative for dysuria, hematuria, vaginal bleeding, vaginal discharge and vaginal pain.       Nocturia not every night  Neurological:  Negative for dizziness, weakness, light-headedness, numbness (tingling in big toes) and headaches.  Psychiatric/Behavioral:  Negative for hallucinations and suicidal ideas.      Objective:    Physical Exam Vitals and nursing note reviewed.  Constitutional:      Appearance: Normal appearance.  HENT:     Right Ear: Tympanic membrane, ear canal and external ear normal. There is no impacted cerumen.     Left Ear: Tympanic membrane, ear canal and external ear normal. There is no impacted cerumen.     Mouth/Throat:     Mouth: Mucous membranes are moist.  Eyes:     Extraocular Movements: Extraocular movements intact.     Pupils: Pupils are equal, round, and reactive to light.  Neck:      Thyroid: No thyroid mass, thyromegaly or thyroid tenderness.  Cardiovascular:     Rate and Rhythm: Normal rate.     Pulses: Normal pulses.  Pulmonary:     Effort: Pulmonary effort is normal.     Breath sounds: Normal breath sounds.  Abdominal:     General: Bowel sounds are normal. There is no distension.     Palpations: There is no mass.     Tenderness: There is no abdominal tenderness.  Lymphadenopathy:     Cervical: No cervical adenopathy.  Neurological:     General: No focal deficit present.     Mental Status: She is alert.  Deep Tendon Reflexes:     Reflex Scores:      Bicep reflexes are 2+ on the right side and 2+ on the left side.      Patellar reflexes are 2+ on the right side and 2+ on the left side.    Comments: Bilateral upper and lower extremity strength 5/5  Psychiatric:        Mood and Affect: Mood normal.        Behavior: Behavior normal.        Thought Content: Thought content normal.        Judgment: Judgment normal.    BP 106/72   Pulse 81   Temp 97.7 F (36.5 C)   Resp 12   Ht 5' 3.5" (1.613 m)   Wt 151 lb 4 oz (68.6 kg)   LMP 11/20/2020   SpO2 99%   BMI 26.37 kg/m  Wt Readings from Last 3 Encounters:  09/27/21 151 lb 4 oz (68.6 kg)  07/05/21 145 lb 4 oz (65.9 kg)  02/22/21 169 lb (76.7 kg)     Health Maintenance Due  Topic Date Due   Pneumococcal Vaccine 58-35 Years old (1 - PCV) Never done   COVID-19 Vaccine (2 - Moderna series) 11/22/2019    There are no preventive care reminders to display for this patient.  Lab Results  Component Value Date   TSH 1.76 03/02/2020   Lab Results  Component Value Date   WBC 6.7 03/02/2020   HGB 14.6 03/02/2020   HCT 42.0 03/02/2020   MCV 91.0 03/02/2020   PLT 186.0 03/02/2020   Lab Results  Component Value Date   NA 135 03/02/2020   K 4.4 03/02/2020   CO2 28 03/02/2020   GLUCOSE 93 03/02/2020   BUN 14 03/02/2020   CREATININE 0.84 03/02/2020   BILITOT 0.6 03/02/2020   ALKPHOS 65  03/02/2020   AST 17 03/02/2020   ALT 19 03/02/2020   PROT 7.2 03/02/2020   ALBUMIN 4.5 03/02/2020   CALCIUM 9.3 03/02/2020   GFR 72.54 03/02/2020   Lab Results  Component Value Date   CHOL 188 08/10/2020   Lab Results  Component Value Date   HDL 56.20 08/10/2020   Lab Results  Component Value Date   LDLCALC 123 (H) 08/10/2020   Lab Results  Component Value Date   TRIG 42.0 08/10/2020   Lab Results  Component Value Date   CHOLHDL 3 08/10/2020   Lab Results  Component Value Date   HGBA1C 5.2 08/10/2020      Assessment & Plan:   Problem List Items Addressed This Visit       Cardiovascular and Mediastinum   Hot flashes    Patient states she is fatigued in office when she is hot flashes at night.  Is followed by GYN discussed possibility of other medications be beneficial in vasomotor symptoms such as SNRIs.  Patient is not a candidate for hormone replacement therapy due to strong breast cancer history      Relevant Orders   TSH     Other   Increased risk of breast cancer    Strong family history of breast cancer patient will need a bilateral diagnostic mammogram in year 2023.      Tobacco abuse    Patient has quit smoking in the past.  States she started back once she was going to divorce approximate 7 years ago.  Has quit in the past using cold [place] method.  Does have some bupropion at  home from previous provider.  She is planning on starting a medication in the first of the year.  Discussed increased risk of cancers while smoking.  Patient knowledge      Anxiety and depression    Currently maintained on sertraline 100 mg.  She denies SI/HI/AVH.  PHQ-9 and GAD-7 administered in office.  Continue sertraline 100 mg      Relevant Medications   sertraline (ZOLOFT) 100 MG tablet   Other fatigue    Likely related to hot flashes that woke patient up throughout the night.  We will check TSH pending results      Relevant Orders   TSH   Preventative health  care - Primary    Did discuss with patient appropriate immunizations and screening exams for age.  Encouraged healthy lifestyle      Relevant Orders   CBC   Comprehensive metabolic panel   Lipid panel   TSH   Hemoglobin A1c    No orders of the defined types were placed in this encounter.   Follow-up: Return in about 1 year (around 09/27/2022).  This visit occurred during the SARS-CoV-2 public health emergency.  Safety protocols were in place, including screening questions prior to the visit, additional usage of staff PPE, and extensive cleaning of exam room while observing appropriate contact time as indicated for disinfecting solutions.     Romilda Garret, NP

## 2021-09-27 NOTE — Assessment & Plan Note (Signed)
Patient states she is fatigued in office when she is hot flashes at night.  Is followed by GYN discussed possibility of other medications be beneficial in vasomotor symptoms such as SNRIs.  Patient is not a candidate for hormone replacement therapy due to strong breast cancer history

## 2021-09-28 ENCOUNTER — Other Ambulatory Visit: Payer: Self-pay | Admitting: Nurse Practitioner

## 2021-09-28 DIAGNOSIS — E875 Hyperkalemia: Secondary | ICD-10-CM

## 2021-09-30 NOTE — Addendum Note (Signed)
Addended by: Ellamae Sia on: 09/30/2021 08:34 AM   Modules accepted: Orders

## 2021-10-18 ENCOUNTER — Other Ambulatory Visit (INDEPENDENT_AMBULATORY_CARE_PROVIDER_SITE_OTHER): Payer: BC Managed Care – PPO

## 2021-10-18 ENCOUNTER — Other Ambulatory Visit: Payer: Self-pay

## 2021-10-18 DIAGNOSIS — E875 Hyperkalemia: Secondary | ICD-10-CM | POA: Diagnosis not present

## 2021-10-18 LAB — POTASSIUM: Potassium: 5 mEq/L (ref 3.5–5.1)

## 2021-10-25 ENCOUNTER — Ambulatory Visit: Admit: 2021-10-25 | Discharge status: Absent without leave | Payer: BC Managed Care – PPO

## 2021-10-25 ENCOUNTER — Telehealth: Payer: Self-pay

## 2021-10-25 NOTE — Telephone Encounter (Signed)
Please see note below access nurse note. Per chart review tab pt is at [facility]. Sending note to Romilda Garret NP and Anastasiya CMA.     [place] Day - Client TELEPHONE ADVICE RECORD AccessNurse Patient Name: Amy Marks Gender: Female DOB: [birth year] Age: 51 Y 1 M 1 D Return Phone Number: [phone] (Primary) Address: City/ State/ Zip: [place] [address] Client [facility] Day - Client Client Site [place] - Day Provider Romilda Garret- NP Contact Type Call Who Is Calling Patient / Member / Family / Caregiver Call Type Triage / Clinical Relationship To Patient Self Return Phone Number [phone] (Primary) Chief Complaint Abdominal Pain Reason for Call Symptomatic / Request for [place] states having uncontrollable diarrhea-2 accidents in sleep, and one at work yesterday; and moderate abd pain upper rt side just below ribcage; [place] Not Listed Urgent Care near her home Translation No Nurse Assessment Nurse: [place], [place] Date/Time (Eastern Time): 10/25/2021 8:35:35 AM Confirm and document reason for call. If symptomatic, describe symptoms. ---Levada Dy states she developed right upper abdominal pain yesterday (current pain rated as a 2-4 on the 1 to 10 scale) and diarrhea about 2 nights ago (about 15-20 episodes in the past 24 hours. She had loss of bowel control 3 times so far.) No blood in her diarrhea. No fever. She last passed urine about 1 hour ago. Alert and responsive. Does the patient have any new or worsening symptoms? ---Yes Will a triage be completed? ---Yes Related visit to physician within the last 2 weeks? ---Yes Does the PT have any chronic conditions? (i.e. diabetes, asthma, this includes High risk factors for pregnancy, etc.) ---Yes List chronic conditions. ---Depression, H-Pylori in the past Is the patient  pregnant or possibly pregnant? (Ask all females between the ages of 93-55) ---No Is this a behavioral health or substance abuse call? ---No Guidelines Guideline Title Affirmed Question Affirmed Notes Nurse Date/Time Eilene Ghazi Time) Abdominal Pain - Upper [1] MILDMODERATE pain AND [2] constant [place], RN, Cathy 10/25/2021 8:38:57 AM PLEASE NOTE: All timestamps contained within this report are represented as [place] Standard Time. CONFIDENTIALTY NOTICE: This fax transmission is intended only for the addressee. It contains information that is legally privileged, confidential or otherwise protected from use or disclosure. If you are not the intended recipient, you are strictly prohibited from reviewing, disclosing, copying using or disseminating any of this information or taking any action in reliance on or regarding this information. If you have received this fax in error, please notify us immediately by telephone so that we can arrange for its return to [place]. Phone: [phone], Toll-Free: [phone], Fax: [phone] Page: 2 of 2 Call Id: [id number] Guidelines Guideline Title Affirmed Question Affirmed Notes Nurse Date/Time Eilene Ghazi Time) AND [3] present > 2 hours Diarrhea [1] Constant abdominal pain AND [2] present > 2 hours Vallery Sa, RN, [facility] 10/25/2021 8:41:49 AM Disp. Time Eilene Ghazi Time) Disposition Final User 10/25/2021 8:41:34 AM See HCP within 4 Hours (or PCP triage) Vallery Sa, RN, [place] 10/25/2021 8:42:43 AM See HCP within 4 Hours (or PCP triage) Yes Vallery Sa, RN, [facility] [place] Disagree/Comply Comply Caller Understands Yes PreDisposition Go to Urgent Care/Walk-In Clinic Care Advice Given Per Guideline SEE HCP (OR PCP TRIAGE) WITHIN 4 HOURS: * IF OFFICE WILL BE OPEN: You need to be seen within the next 3 or 4 hours. Call your doctor (or NP/PA) now or as soon as the office opens. TAKE AN  ANTACID MEDICINE: * If having pain now, try taking an antacid (e.g., Mylanta, Maalox). CALL  BACK IF: * You become worse CARE ADVICE given per Abdominal Pain, Upper (Adult) guideline. SEE HCP (OR PCP TRIAGE) WITHIN 4 HOURS: * IF OFFICE WILL BE OPEN: You need to be seen within the next 3 or 4 hours. Call your doctor (or NP/PA) now or as soon as the office opens. CALL BACK IF: * You become worse CARE ADVICE given per Diarrhea (Adult) guideline. Comments User: Berton Mount, RN Date/Time Eilene Ghazi Time): 10/25/2021 8:48:38 AM No appointment available. She will go to urgent care. Referrals REFERRED TO PCP OFFICE GO TO FACILITY OTHER - SPECIF

## 2021-10-25 NOTE — Telephone Encounter (Signed)
I spoke with Juliann Pulse RN with Access nurse;pt has rt upper abd pain and diarrhea. Pt has hx of h pylori but pt thinks this is different. Access disposition is to be seen within 4 hrs and no available appts at [facility] and Juliann Pulse will direct pt to UC. When access nurse note comes in will add to this note. Sending to Romilda Garret NP and [place] CMA

## 2021-10-25 NOTE — Telephone Encounter (Signed)
Can we see what the plan is for this patient please. Do not see that she is at an urgent care

## 2021-10-25 NOTE — Telephone Encounter (Signed)
Left message for patient to call back with update. Or to send mychart message

## 2021-10-26 ENCOUNTER — Ambulatory Visit
Admission: RE | Admit: 2021-10-26 | Discharge: 2021-10-26 | Disposition: A | Discharge status: Home / Self Care | Payer: BC Managed Care – PPO | Source: Ambulatory Visit | Attending: Family Medicine | Admitting: Family Medicine

## 2021-10-26 VITALS — BP 109/72 | HR 70 | Temp 98.1°F | Resp 16

## 2021-10-26 DIAGNOSIS — R1011 Right upper quadrant pain: Secondary | ICD-10-CM | POA: Diagnosis not present

## 2021-10-26 LAB — POCT URINALYSIS DIP (MANUAL ENTRY)
Bilirubin, UA: NEGATIVE
Blood, UA: NEGATIVE
Glucose, UA: NEGATIVE mg/dL
Ketones, POC UA: NEGATIVE mg/dL
Leukocytes, UA: NEGATIVE
Nitrite, UA: NEGATIVE
Protein Ur, POC: NEGATIVE mg/dL
Spec Grav, UA: 1.01 (ref 1.010–1.025)
Urobilinogen, UA: 0.2 E.U./dL
pH, UA: 5.5 (ref 5.0–8.0)

## 2021-10-26 NOTE — ED Triage Notes (Signed)
Pt c/o right side abdominal pain tender to touch. The pain increases when to touch her abdomen. She also c/o of diarrhea x 3 days.

## 2021-10-26 NOTE — Discharge Instructions (Signed)
Follow-up with your PCP on Monday. I think you would benefit from having a gallbladder study to rule out gallbladder disease as the source f your symptoms.  If you began to experience any excruciating abdominal pain or sharp pain, begin to pass bloody stool or vomit blood, these are indications to go immediately to the emergency department

## 2021-10-26 NOTE — ED Provider Notes (Signed)
Amy Marks    CSN: [id number] Arrival date & time: 10/26/21  1336      History   Chief Complaint Chief Complaint  Patient presents with   Abdominal Pain    HPI Amy Marks is a 51 y.o. female.   HPI Patient with a  history H. Pylori, IBS, presents today with 3 days of upper right mid abdomen pain. The pain is exacerbated by lying on her right side or crossing her arms. The pain is reproducible with pushing down at the upper right mid portion of abdomen below her right breast. No fever, nausea, or vomiting. She has experienced 3 days of diarrhea prior to the onset of abdominal pain. She has her gallbladder and no history of prior issues with her gallbladder. At times the pain is intense, however with sitting she endorses mild discomfort. She has not taken any medication to try and alleviate pain. Denies any recent changes in her diet. Past Medical History:  Diagnosis Date   Anxiety    Depression    H. pylori infection 2018   treated with abx   IBS (irritable bowel syndrome)    OCD (obsessive compulsive disorder)    Seasonal allergies    Social anxiety disorder 09/12/2013    Patient Active Problem List   Diagnosis Date Noted   Hot flashes 09/27/2021   Other fatigue 09/27/2021   Preventative health care 09/27/2021   Periorbital edema 07/05/2021   De Quervain's disease (tenosynovitis) 07/05/2021   Gastritis 04/25/2020   H. pylori infection 03/02/2020   Tobacco abuse 06/20/2019   Anxiety and depression 06/20/2019   Decreased hearing, left 06/09/2019   Increased risk of breast cancer 09/24/2018   Rash 06/19/2017   Obesity (BMI 30.0-34.9) 12/08/2014   Social anxiety disorder 09/12/2013   Major depressive disorder, recurrent ([place]) 06/27/2013   Carpal tunnel syndrome, bilateral 12/01/2011   ANXIETY STATE, UNSPECIFIED 01/02/2011   DISORDERS, OBSESSIVE-COMPULSIVE 03/26/2007    Past Surgical History:  Procedure Laterality Date   DILITATION &  CURRETTAGE/HYSTROSCOPY WITH NOVASURE ABLATION  09/03/2012   Procedure: DILATATION & CURETTAGE/HYSTEROSCOPY WITH NOVASURE ABLATION;  Surgeon: Daria Pastures, MD;  Location: [facility];  Service: Gynecology;  Laterality: N/A;   LAPAROSCOPIC TUBAL LIGATION  09/03/2012   Procedure: LAPAROSCOPIC TUBAL LIGATION;  Surgeon: Daria Pastures, MD;  Location: [facility];  Service: Gynecology;  Laterality: Bilateral;  FILSHIE CLIPS   LAPAROSCOPY  10/14/2012   Procedure: LAPAROSCOPY OPERATIVE with LSO and LOA;  Surgeon: Farrel Gobble. Harrington Challenger, MD;  Location: [facility];  Service: Gynecology;  Laterality: N/A;   LAPAROTOMY  10/14/2012   Procedure: EXPLORATORY LAPAROTOMY;  Surgeon: Farrel Gobble. Harrington Challenger, MD;  Location: [facility];  Service: Gynecology;  Laterality: N/A;   NASAL SEPTUM SURGERY     OOPHORECTOMY  10/14/2012   Left ovary removed--hx "ruptured chocolate cyst"   TONSILLECTOMY      OB History     Gravida  2   Para  2   Term  2   Preterm      AB      Living  2      SAB      IAB      Ectopic      Multiple      Live Births               Home Medications    Prior to Admission medications   Medication Sig Start Date End Date Taking? Authorizing Provider  buPROPion [facility] SR) 150  MG 12 hr tablet 1 tablet a day x 3 days then 1 tablet twice a day, at least 8 hours apart, second dose no later than 6 pm. 10/03/20  Yes Elby Beck, FNP  propranolol (INDERAL) 10 MG tablet 1 - 2 tablets 30 minutes before social situation 10/15/16  Yes Elby Beck, FNP  sertraline (ZOLOFT) 100 MG tablet Take 1 tablet (100 mg total) by mouth daily. 09/27/21  Yes Michela Pitcher, NP    Family History Family History  Problem Relation Age of Onset   Depression Mother    Fibromyalgia Mother    Breast cancer Mother 22   Rheum arthritis Mother    Alcohol abuse Father    Breast cancer Sister 61   Breast cancer Paternal Aunt 68   Diverticulitis Paternal Aunt    Breast cancer Paternal Aunt    Cancer  Paternal Uncle    Heart attack Paternal Uncle    Emphysema Maternal Grandmother        Dec COPD   Breast cancer Maternal Grandmother 60       times 2   Multiple sclerosis Maternal Grandfather    Cancer Other 43       rectal cancer    Social History Social History   Tobacco Use   Smoking status: Every Day    Packs/day: 0.50    Types: Cigarettes   Smokeless tobacco: Never   Tobacco comments:    started smoking again 2015  Vaping Use   Vaping Use: Never used  Substance Use Topics   Alcohol use: Not Currently   Drug use: Never     Allergies   Patient has no known allergies.   Review of Systems Review of Systems Pertinent negatives listed in HPI   Physical Exam Triage Vital Signs ED Triage Vitals [10/26/21 1505]  Enc Vitals Group     BP 109/72     Pulse Rate 70     Resp 16     Temp 98.1 F (36.7 C)     Temp Source Oral     SpO2 98 %     Weight      Height      Head Circumference      Peak Flow      Pain Score 5     Pain Loc      Pain Edu?      Excl. in [place]?    No data found.  Updated Vital Signs BP 109/72 (BP Location: Left Arm)    Pulse 70    Temp 98.1 F (36.7 C) (Oral)    Resp 16    LMP 11/20/2020    SpO2 98%   Visual Acuity Right Eye Distance:   Left Eye Distance:   Bilateral Distance:    Right Eye Near:   Left Eye Near:    Bilateral Near:     Physical Exam Constitutional:      General: She is not in acute distress.    Appearance: She is well-developed. She is not ill-appearing.  HENT:     Head: Normocephalic.  Eyes:     Extraocular Movements: Extraocular movements intact.     Pupils: Pupils are equal, round, and reactive to light.  Cardiovascular:     Rate and Rhythm: Normal rate and regular rhythm.  Abdominal:     General: Bowel sounds are increased.     Tenderness: There is abdominal tenderness in the right upper quadrant. There is no guarding or rebound.  Hernia: No hernia is present.    Skin:    General: Skin is warm.   Neurological:     General: No focal deficit present.     Mental Status: She is alert.  Psychiatric:        Mood and Affect: Mood normal.        Behavior: Behavior normal.     UC Treatments / Results  Labs (all labs ordered are listed, but only abnormal results are displayed) Labs Reviewed  POCT URINALYSIS DIP (MANUAL ENTRY)    EKG   Radiology No results found.  Procedures Procedures (including critical care time)  Medications Ordered in UC Medications - No data to display  Initial Impression / Assessment and Plan / UC Course  I have reviewed the triage vital signs and the nursing notes.  Pertinent labs & imaging results that were available during my care of the patient were reviewed by me and considered in my medical decision making (see chart for details).    Patient appears stable and exam findings are inconsistent with an acute abdomen. However, patient warrants further work-up with advanced imaging to rule out possible underlying gallbladder disease. Given limitations of urgent care and no availability of diagnostic imaging, patient advised to follow-up with PCP on Monday. Strict ER precautions if at anytime her symptoms worsen or becomes severe prior to follow-up with PCP.  Final Clinical Impressions(s) / UC Diagnoses   Final diagnoses:  Abdominal pain, right upper quadrant     Discharge Instructions      Follow-up with your PCP on Monday. I think you would benefit from having a gallbladder study to rule out gallbladder disease as the source f your symptoms.  If you began to experience any excruciating abdominal pain or sharp pain, begin to pass bloody stool or vomit blood, these are indications to go immediately to the emergency department      ED Prescriptions   None    PDMP not reviewed this encounter.   Scot Jun, FNP 10/27/21 1624

## 2021-10-28 ENCOUNTER — Encounter: Payer: Self-pay | Admitting: Nurse Practitioner

## 2021-10-28 NOTE — Telephone Encounter (Signed)
Looked at the chart and patient was seen at urgent care

## 2021-11-08 ENCOUNTER — Other Ambulatory Visit: Payer: Self-pay

## 2021-11-08 ENCOUNTER — Ambulatory Visit (INDEPENDENT_AMBULATORY_CARE_PROVIDER_SITE_OTHER): Payer: BC Managed Care – PPO | Admitting: Nurse Practitioner

## 2021-11-08 ENCOUNTER — Encounter: Payer: Self-pay | Admitting: Nurse Practitioner

## 2021-11-08 ENCOUNTER — Encounter: Payer: Self-pay | Admitting: *Deleted

## 2021-11-08 VITALS — BP 94/60 | HR 69 | Temp 97.7°F | Resp 12 | Ht 63.5 in | Wt 158.4 lb

## 2021-11-08 DIAGNOSIS — R1011 Right upper quadrant pain: Secondary | ICD-10-CM

## 2021-11-08 LAB — CBC
HCT: 41 % (ref 36.0–46.0)
Hemoglobin: 13.9 g/dL (ref 12.0–15.0)
MCHC: 33.8 g/dL (ref 30.0–36.0)
MCV: 91.7 fl (ref 78.0–100.0)
Platelets: 161 10*3/uL (ref 150.0–400.0)
RBC: 4.48 Mil/uL (ref 3.87–5.11)
RDW: 12.6 % (ref 11.5–15.5)
WBC: 5.3 10*3/uL (ref 4.0–10.5)

## 2021-11-08 LAB — COMPREHENSIVE METABOLIC PANEL
ALT: 15 U/L (ref 0–35)
AST: 17 U/L (ref 0–37)
Albumin: 4.3 g/dL (ref 3.5–5.2)
Alkaline Phosphatase: 46 U/L (ref 39–117)
BUN: 15 mg/dL (ref 6–23)
CO2: 28 mEq/L (ref 19–32)
Calcium: 9.3 mg/dL (ref 8.4–10.5)
Chloride: 104 mEq/L (ref 96–112)
Creatinine, Ser: 0.9 mg/dL (ref 0.40–1.20)
GFR: 75.27 mL/min (ref >60.00)
Glucose, Bld: 86 mg/dL (ref 70–99)
Potassium: 4.5 mEq/L (ref 3.5–5.1)
Sodium: 138 mEq/L (ref 135–145)
Total Bilirubin: 0.6 mg/dL (ref 0.2–1.2)
Total Protein: 6.8 g/dL (ref 6.0–8.3)

## 2021-11-08 LAB — LIPASE: Lipase: 24 U/L (ref 11.0–59.0)

## 2021-11-08 NOTE — Patient Instructions (Addendum)
Nice to see you today Keep me update on if the symptoms return or change I will be in touch with the lab results once they are resulted I have placed the order for the ultrasound they will call you and you can schedule it. Follow up as needed

## 2021-11-08 NOTE — Assessment & Plan Note (Signed)
Patient is here for follow-up from urgent care.  She had right upper quadrant pain that now has resolved.  Does still have her gallbladder intact.  Urgent care due to urine and recommend she follow-up with PCP for gallbladder work-up.  We will draw labs and schedule Urgent ultrasound.  Patient acknowledged and is on board with this plan of care.  Pending results

## 2021-11-08 NOTE — Progress Notes (Signed)
Established Patient Office Visit  Subjective:  Patient ID: Amy Marks, female    DOB: October 09, 1971  Age: 51 y.o. MRN: [id number]  CC:  Chief Complaint  Patient presents with   Follow up on urgent care visit    Doing better, no pain just some discomfort when pushing on the right top abdomen area-almost like when you push down on a bruise.     HPI Amy Marks presents for Urgent care follow up  States that the Wednesday before she went to UC she had uncontrollable diarrhea, describes fecal urgency. That was improving but then she developed RUQ pain. Was seein in the UC and they did a urine study and recommended a f/u with PCP  Does have history of H pylori does not feel similar. States that she would get gassey, bruping and skin rash when she had H. Pylori. Was managed by ID in order to get rid of the infection last time Eating did not make a difference with the discomfort.  Eating could facilate the BM.  Past Medical History:  Diagnosis Date   Anxiety    Depression    H. pylori infection 2018   treated with abx   IBS (irritable bowel syndrome)    OCD (obsessive compulsive disorder)    Seasonal allergies    Social anxiety disorder 09/12/2013    Past Surgical History:  Procedure Laterality Date   DILITATION & CURRETTAGE/HYSTROSCOPY WITH NOVASURE ABLATION  09/03/2012   Procedure: DILATATION & CURETTAGE/HYSTEROSCOPY WITH NOVASURE ABLATION;  Surgeon: Daria Pastures, MD;  Location: [facility];  Service: Gynecology;  Laterality: N/A;   LAPAROSCOPIC TUBAL LIGATION  09/03/2012   Procedure: LAPAROSCOPIC TUBAL LIGATION;  Surgeon: Daria Pastures, MD;  Location: [facility];  Service: Gynecology;  Laterality: Bilateral;  FILSHIE CLIPS   LAPAROSCOPY  10/14/2012   Procedure: LAPAROSCOPY OPERATIVE with LSO and LOA;  Surgeon: Farrel Gobble. Harrington Challenger, MD;  Location: [facility];  Service: Gynecology;  Laterality: N/A;   LAPAROTOMY  10/14/2012   Procedure: EXPLORATORY LAPAROTOMY;  Surgeon: Farrel Gobble. Harrington Challenger,  MD;  Location: [facility];  Service: Gynecology;  Laterality: N/A;   NASAL SEPTUM SURGERY     OOPHORECTOMY  10/14/2012   Left ovary removed--hx "ruptured chocolate cyst"   TONSILLECTOMY      Family History  Problem Relation Age of Onset   Depression Mother    Fibromyalgia Mother    Breast cancer Mother 67   Rheum arthritis Mother    Alcohol abuse Father    Breast cancer Sister 19   Breast cancer Paternal Aunt 59   Diverticulitis Paternal Aunt    Breast cancer Paternal Aunt    Cancer Paternal Uncle    Heart attack Paternal Uncle    Emphysema Maternal Grandmother        Dec COPD   Breast cancer Maternal Grandmother 60       times 2   Multiple sclerosis Maternal Grandfather    Cancer Other 80       rectal cancer    Social History   Socioeconomic History   Marital status: Divorced    Spouse name: Not on file   Number of children: 2   Years of education: Not on file   Highest education level: Not on file  Occupational History   Occupation: [employer]    Employer: [employer]  Tobacco Use   Smoking status: Every Day    Packs/day: 0.50    Types: Cigarettes   Smokeless tobacco: Never  Tobacco comments:    started smoking again 2015  Vaping Use   Vaping Use: Never used  Substance and Sexual Activity   Alcohol use: Not Currently   Drug use: Never   Sexual activity: Not Currently    Birth control/protection: Surgical    Comment: Tubal  Other Topics Concern   Not on file  Social History Narrative   Divorced. Two children, daughter and son. Works as a Copywriter, advertising.    Social Determinants of Radio broadcast assistant Strain: Not on file  Food Insecurity: Not on file  Transportation Needs: Not on file  Physical Activity: Not on file  Stress: Not on file  Social Connections: Not on file  Intimate Partner Violence: Not on file    Outpatient Medications Prior to Visit  Medication Sig Dispense Refill   propranolol (INDERAL) 10 MG tablet 1 - 2 tablets 30  minutes before social situation 60 tablet 5   sertraline (ZOLOFT) 100 MG tablet Take 1 tablet (100 mg total) by mouth daily. 90 tablet 3   buPROPion (WELLBUTRIN SR) 150 MG 12 hr tablet 1 tablet a day x 3 days then 1 tablet twice a day, at least 8 hours apart, second dose no later than 6 pm. (Patient not taking: Reported on 11/08/2021) 60 tablet 2   No facility-administered medications prior to visit.    No Known Allergies  ROS Review of Systems  Constitutional:  Negative for chills, fatigue and fever.  Respiratory:  Negative for cough and shortness of breath.   Cardiovascular:  Negative for chest pain.  Gastrointestinal:  Negative for abdominal pain, diarrhea, nausea and vomiting.       Bm back to her normal of looser stools  Neurological:  Negative for dizziness, light-headedness and numbness.     Objective:    Physical Exam Vitals and nursing note reviewed.  Constitutional:      Appearance: Normal appearance.  Cardiovascular:     Rate and Rhythm: Normal rate and regular rhythm.     Heart sounds: Normal heart sounds.  Pulmonary:     Effort: Pulmonary effort is normal.     Breath sounds: Normal breath sounds.  Abdominal:     General: Bowel sounds are normal. There is no distension.     Palpations: There is no mass.     Tenderness: There is abdominal tenderness in the right upper quadrant. Positive signs include Murphy's sign.    Neurological:     Mental Status: She is alert.    BP 94/60    Pulse 69    Temp 97.7 F (36.5 C)    Resp 12    Ht 5' 3.5" (1.613 m)    Wt 158 lb 6 oz (71.8 kg)    LMP 11/20/2020    SpO2 99%    BMI 27.61 kg/m  Wt Readings from Last 3 Encounters:  11/08/21 158 lb 6 oz (71.8 kg)  09/27/21 151 lb 4 oz (68.6 kg)  07/05/21 145 lb 4 oz (65.9 kg)     Health Maintenance Due  Topic Date Due   COVID-19 Vaccine (2 - Moderna series) 11/22/2019    There are no preventive care reminders to display for this patient.  Lab Results  Component Value Date    TSH 1.54 09/27/2021   Lab Results  Component Value Date   WBC 4.9 09/27/2021   HGB 14.9 09/27/2021   HCT 43.1 09/27/2021   MCV 92.0 09/27/2021   PLT 165.0 09/27/2021   Lab Results  Component Value Date   NA 138 09/27/2021   K 5.0 10/18/2021   CO2 28 09/27/2021   GLUCOSE 84 09/27/2021   BUN 19 09/27/2021   CREATININE 0.86 09/27/2021   BILITOT 0.5 09/27/2021   ALKPHOS 52 09/27/2021   AST 17 09/27/2021   ALT 16 09/27/2021   PROT 7.2 09/27/2021   ALBUMIN 4.5 09/27/2021   CALCIUM 9.8 09/27/2021   GFR 79.56 09/27/2021   Lab Results  Component Value Date   CHOL 201 (H) 09/27/2021   Lab Results  Component Value Date   HDL 71.70 09/27/2021   Lab Results  Component Value Date   LDLCALC 122 (H) 09/27/2021   Lab Results  Component Value Date   TRIG 38.0 09/27/2021   Lab Results  Component Value Date   CHOLHDL 3 09/27/2021   Lab Results  Component Value Date   HGBA1C 5.3 09/27/2021      Assessment & Plan:   Problem List Items Addressed This Visit       Other   RUQ pain - Primary    Patient is here for follow-up from urgent care.  She had right upper quadrant pain that now has resolved.  Does still have her gallbladder intact.  Urgent care due to urine and recommend she follow-up with PCP for gallbladder work-up.  We will draw labs and schedule Urgent ultrasound.  Patient acknowledged and is on board with this plan of care.  Pending results      Relevant Orders   CBC (Completed)   Comprehensive metabolic panel (Completed)   Lipase (Completed)   US Abdomen Limited RUQ (LIVER/GB)    No orders of the defined types were placed in this encounter.   Follow-up: No follow-ups on file.   This visit occurred during the SARS-CoV-2 public health emergency.  Safety protocols were in place, including screening questions prior to the visit, additional usage of staff PPE, and extensive cleaning of exam room while observing appropriate contact time as indicated for  disinfecting solutions.   Romilda Garret, NP

## 2021-11-22 ENCOUNTER — Ambulatory Visit
Admission: RE | Admit: 2021-11-22 | Discharge: 2021-11-22 | Disposition: A | Discharge status: Home / Self Care | Payer: BC Managed Care – PPO | Source: Ambulatory Visit | Attending: Nurse Practitioner | Admitting: Nurse Practitioner

## 2021-11-22 DIAGNOSIS — R1011 Right upper quadrant pain: Secondary | ICD-10-CM | POA: Diagnosis not present

## 2021-11-22 DIAGNOSIS — K824 Cholesterolosis of gallbladder: Secondary | ICD-10-CM | POA: Diagnosis not present

## 2021-11-22 IMAGING — US US ABDOMEN LIMITED
1 series · 14 of 25 positions shown · non-contrast
Comparison: None.

CLINICAL DATA: Right upper quadrant pain

EXAM:
ULTRASOUND ABDOMEN LIMITED RIGHT UPPER QUADRANT

[Series 1: us abdomen limited · 0.22mm/px · 14 of 46 slices shown]
[im 1/46]
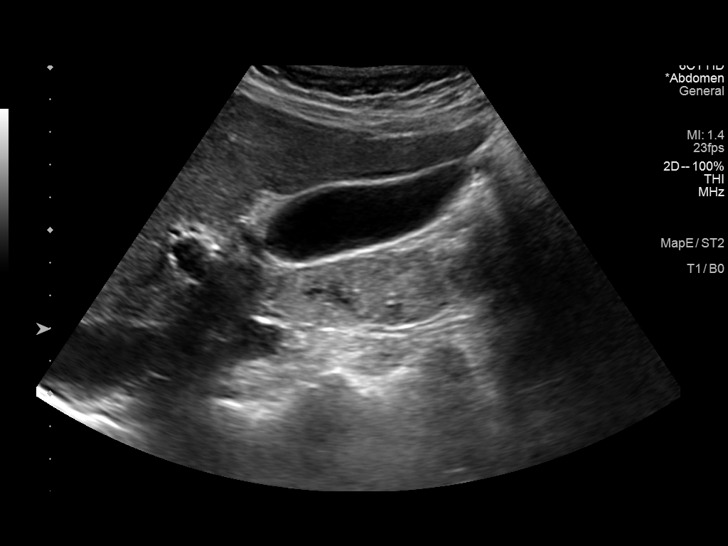
[im 4/46]
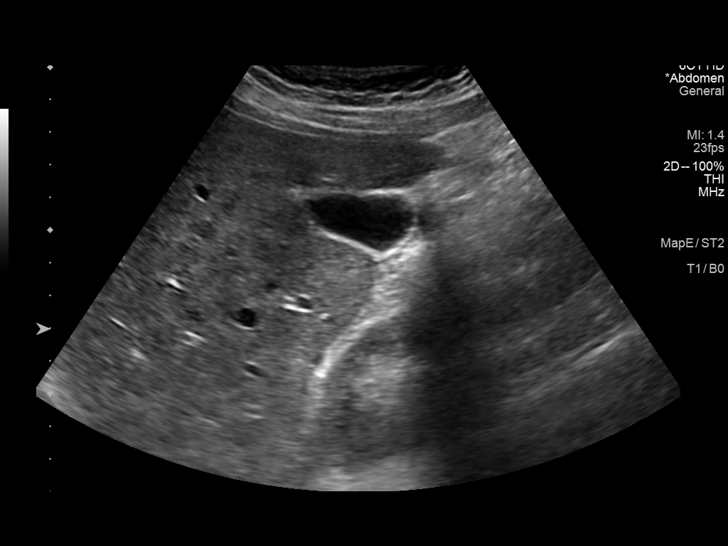
[im 8/46]
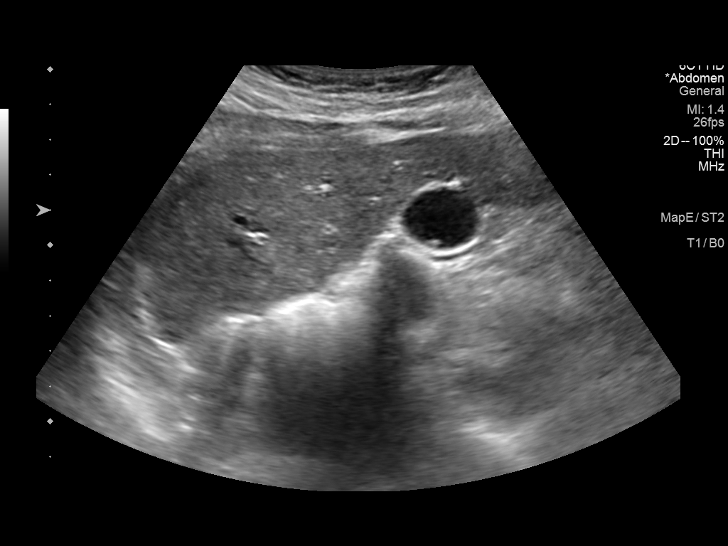
[im 12/46]
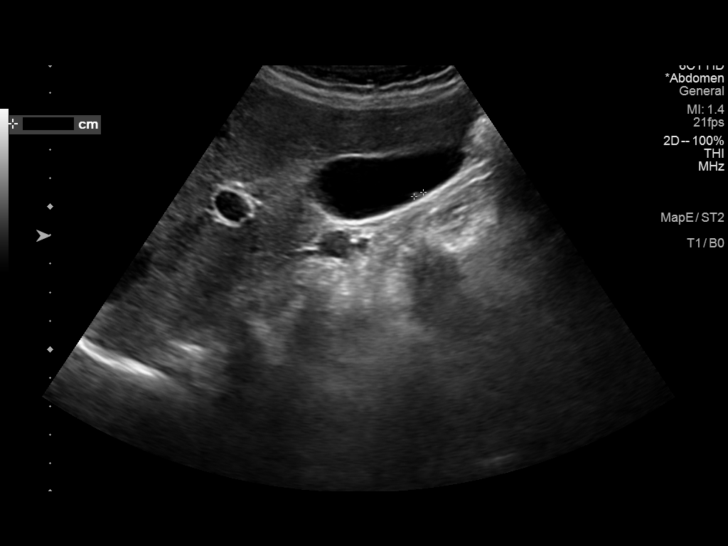
[im 16/46]
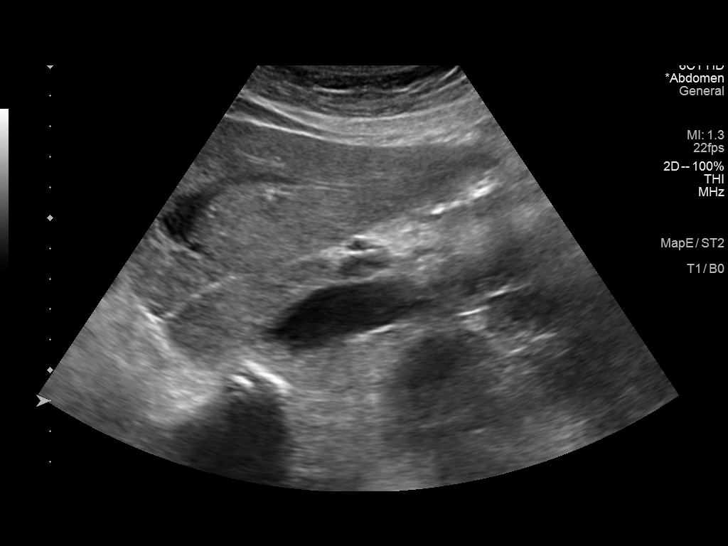
[im 17/46]
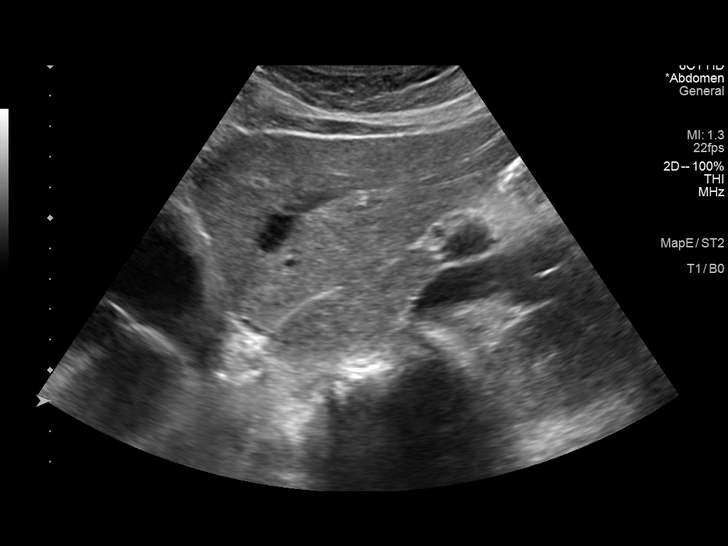
[im 21/46]
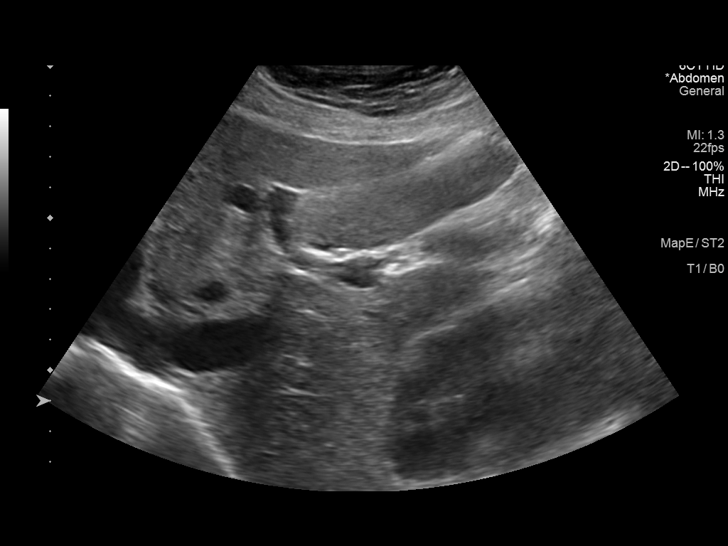
[im 25/46]
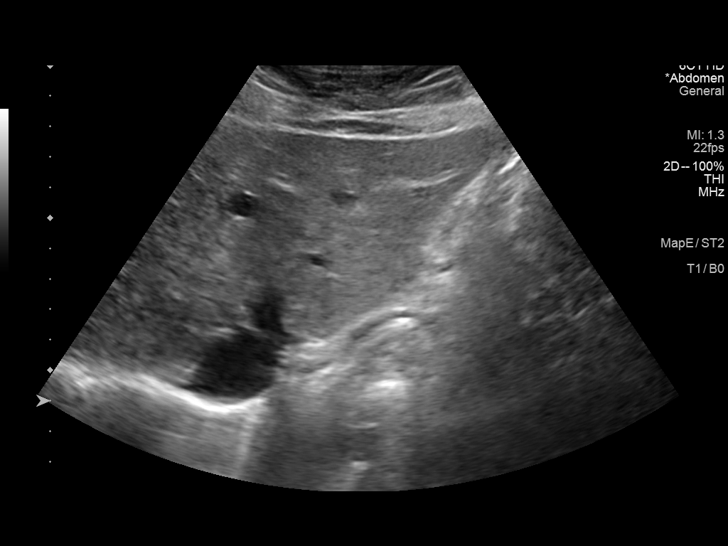
[im 29/46]
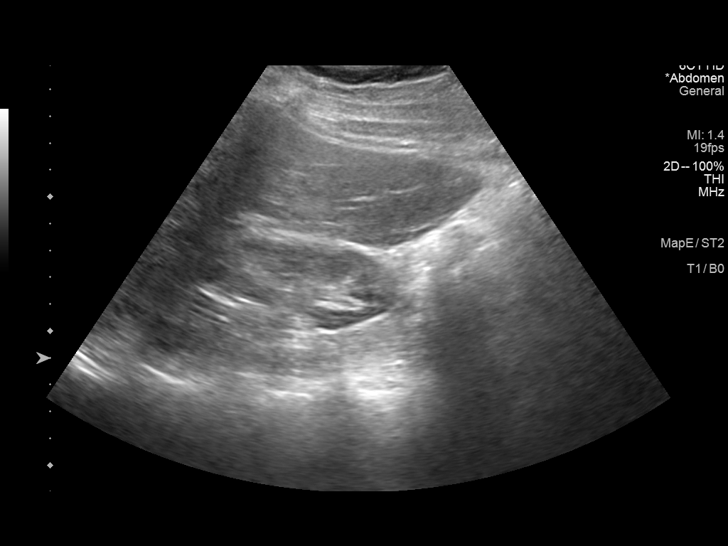
[im 31/46]
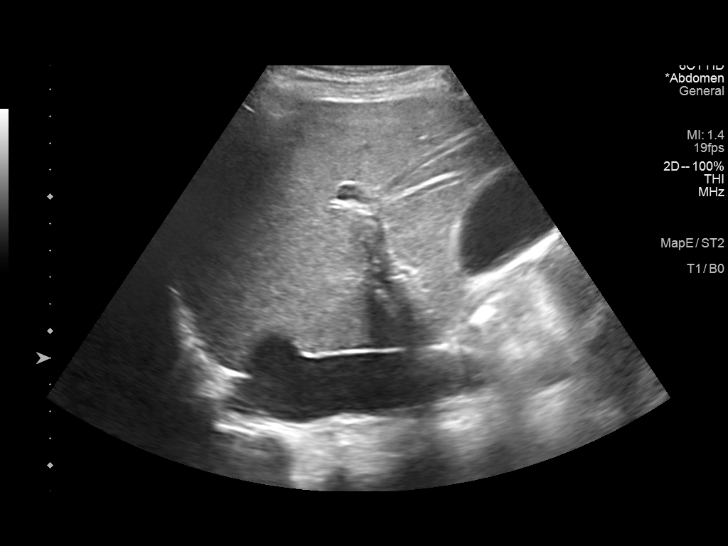
[im 34/46]
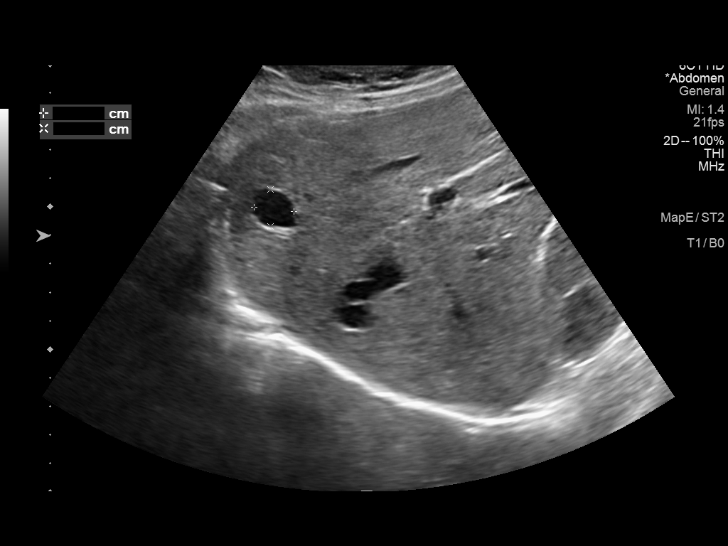
[im 38/46]
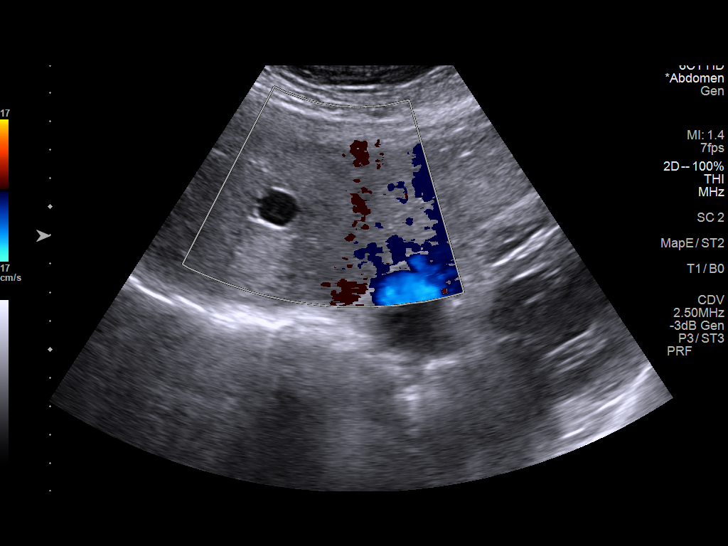
[im 42/46]
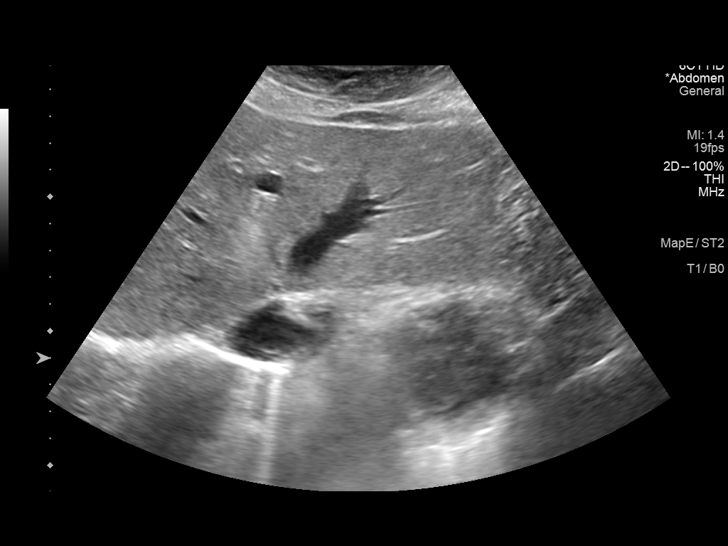
[im 46/46]
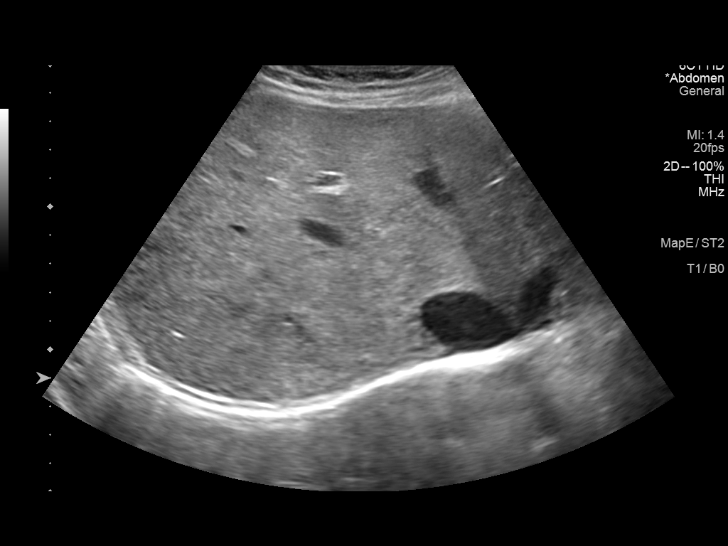

[14 of 25 positions shown; findings below may reference images not displayed]

FINDINGS: Gallbladder:

There is a 3.6 mm polyp in the gallbladder. No wall thickening,
stones, sludge, pericholecystic fluid, or Murphy's sign.

Common bile duct:

Diameter: 3.2 mm

Liver:

There is a minimally complicated cyst in the right hepatic lobe
measuring up to 1.6 cm. A small hypoechoic mass in the left hepatic
lobe is probably a tiny complicated cyst as well measuring up to 10
mm. Portal vein is patent on color Doppler imaging with normal
direction of blood flow towards the liver.

Other: None.
IMPRESSION: 1. There is a 3.6 mm polyp in the gallbladder not requiring
follow-up. The gallbladder is otherwise normal.
2. Two masses in the liver. The largest measuring 1.6 cm is
consistent with a mildly complicated cyst. The other is too small to
characterize but probably a small cyst as well. Recommend attention
on follow-up.

## 2021-11-26 ENCOUNTER — Telehealth: Payer: Self-pay

## 2021-11-26 ENCOUNTER — Encounter: Payer: Self-pay | Admitting: Nurse Practitioner

## 2021-11-26 NOTE — Telephone Encounter (Signed)
Scheduled patient to see Dr. Fuller Plan on 12/17/21 at 2:30pm. Left message for patient to return my call.

## 2021-11-26 NOTE — Telephone Encounter (Signed)
-----   Message from Ladene Artist, MD sent at 11/26/2021 10:06 AM EST ----- Regarding: RE: RUQ [place] Hi, The hepatic lesions appear c/w cysts on [place]. We will contact her regarding further evaluation with CT and/or MRI.  Estill Bamberg please schedule an office visit with me or an APP.  MS ----- Message ----- From: Michela Pitcher, NP Sent: 11/26/2021   8:01 AM EST To: Ladene Artist, MD Subject: RUQ [place]                                         Dr. Fuller Plan,  I saw our mutual patient and got a [place] RUQ and it showed two masses in the liver. Not sure as the type or time frame of follow up so I wanted to reach out for advice in that regard.  Thanks,  Romilda Garret, NP

## 2021-11-27 NOTE — Telephone Encounter (Signed)
Patient rescheduled appt for 11/29/21 at 10:10 am.

## 2021-11-27 NOTE — Telephone Encounter (Signed)
Patient returned call. Advised her about appt. Requesting if there can be a Friday appt

## 2021-11-27 NOTE — Telephone Encounter (Signed)
Left message for patient informing her the only Friday appt Dr. Fuller Plan has available any time soon is this Friday 2/10. Also, stated on voicemail that she needs to call me back soon if she would like the appt.

## 2021-11-29 ENCOUNTER — Ambulatory Visit: Payer: BC Managed Care – PPO | Admitting: Gastroenterology

## 2021-11-29 ENCOUNTER — Encounter: Payer: Self-pay | Admitting: Gastroenterology

## 2021-11-29 VITALS — BP 104/68 | HR 64 | Ht 63.5 in | Wt 157.8 lb

## 2021-11-29 DIAGNOSIS — K824 Cholesterolosis of gallbladder: Secondary | ICD-10-CM

## 2021-11-29 DIAGNOSIS — R935 Abnormal findings on diagnostic imaging of other abdominal regions, including retroperitoneum: Secondary | ICD-10-CM

## 2021-11-29 NOTE — Patient Instructions (Signed)
You have been scheduled for an MRI of the liver at [facility] on 12/13/21. Your appointment time is 3:00pm. Please arrive to admitting (at main entrance of the hospital) 30 minutes prior to your appointment time for registration purposes. Please make certain not to have anything to eat or drink 6 hours prior to your test. In addition, if you have any metal in your body, have a pacemaker or defibrillator, please be sure to let your ordering physician know. This test typically takes 45 minutes to 1 hour to complete. Should you need to reschedule, please call [phone] to do so.  Due to recent changes in healthcare laws, you may see the results of your imaging and laboratory studies on MyChart before your provider has had a chance to review them.  We understand that in some cases there may be results that are confusing or concerning to you. Not all laboratory results come back in the same time frame and the provider may be waiting for multiple results in order to interpret others.  Please give [place] 48 hours in order for your provider to thoroughly review all the results before contacting the office for clarification of your results.   The [facility] providers would like to encourage you to use [facility] to communicate with providers for non-urgent requests or questions.  Due to long hold times on the telephone, sending your provider a message by [facility] may be a faster and more efficient way to get a response.  Please allow 48 business hours for a response.  Please remember that this is for non-urgent requests.    Thank you for choosing me and [facility].  Pricilla Riffle. Dagoberto Ligas., MD., Marval Regal

## 2021-11-29 NOTE — Progress Notes (Signed)
° ° °  History of Present Illness: This is a 51 year old female referred for evaluation of an abnormal RUQ ultrasound.  She developed mild right upper quadrant tenderness and an ultrasound was ordered.  Her abdominal tenderness began suddenly and was only noted with deep palpation. It does not occur at other times.  It has substantially improved over the past week.  RUQ [place] 11/22/2021 1. There is a 3.6 mm polyp in the gallbladder not requiring follow-up. The gallbladder is otherwise normal. 2. Two masses in the liver. The largest measuring 1.6 cm is consistent with a mildly complicated cyst. The other is too small to characterize but probably a small cyst as well. Recommend attention on follow-up.  Current Medications, Allergies, Past Medical History, Past Surgical History, Family History and Social History were reviewed in [employer] record.   Physical Exam: General: Well developed, well nourished, no acute distress Head: Normocephalic and atraumatic Eyes: Sclerae anicteric, EOMI Ears: Normal auditory acuity Mouth: Not examined, mask on during Covid-19 pandemic Lungs: Clear throughout to auscultation Heart: Regular rate and rhythm; no murmurs, rubs or bruits Abdomen: Soft, minimal RUQ tenderness to deep palpation and non distended. No masses, hepatosplenomegaly or hernias noted. Normal Bowel sounds Rectal: Not done Musculoskeletal: Symmetrical with no gross deformities  Pulses:  Normal pulses noted Extremities: No clubbing, cyanosis, edema or deformities noted Neurological: Alert oriented x 4, grossly nonfocal Psychological:  Alert and cooperative. Normal mood and affect   Assessment and Recommendations:  Abnormal US of the liver with a 1.6 cm lesion appearing to be a mildly complicated cyst and a second lesion that is too small to characterize but probably is a small cyst.  Schedule abdominal MRI to further evaluate.  A 3.6 mm gallbladder polyp was noted that does  not require follow-up. RUQ tenderness to deep palpation is likely musculoskeletal.  Patient is to contact us if symptoms do not resolve over the next few weeks.

## 2021-12-12 ENCOUNTER — Other Ambulatory Visit: Payer: Self-pay

## 2021-12-12 ENCOUNTER — Ambulatory Visit (HOSPITAL_COMMUNITY)
Admission: RE | Admit: 2021-12-12 | Discharge: 2021-12-12 | Disposition: A | Discharge status: Home / Self Care | Payer: BC Managed Care – PPO | Source: Ambulatory Visit | Attending: Gastroenterology | Admitting: Gastroenterology

## 2021-12-12 DIAGNOSIS — K8689 Other specified diseases of pancreas: Secondary | ICD-10-CM | POA: Diagnosis not present

## 2021-12-12 DIAGNOSIS — R935 Abnormal findings on diagnostic imaging of other abdominal regions, including retroperitoneum: Secondary | ICD-10-CM | POA: Diagnosis not present

## 2021-12-12 DIAGNOSIS — K760 Fatty (change of) liver, not elsewhere classified: Secondary | ICD-10-CM | POA: Diagnosis not present

## 2021-12-12 IMAGING — MR MR ABDOMEN WO/W CM
19 series · 48 of 48 positions shown · IV contrast (7ml GADAVIST)
Comparison: Ultrasound [DATE]

CLINICAL DATA: Further evaluation of liver lesion seen on prior
imaging.

EXAM:
MRI ABDOMEN WITHOUT AND WITH CONTRAST
TECHNIQUE: Multiplanar multisequence MR imaging of the abdomen was performed
both before and after the administration of intravenous contrast.
CONTRAST:  7mL GADAVIST GADOBUTROL 1 MMOL/ML IV SOLN

[Series 2: DWI · axial · 6.0mm · 1.42mm/px · z∈[-98,+169]mm · 3 of 76 slices shown (1 of 2)]
[im 1/76]
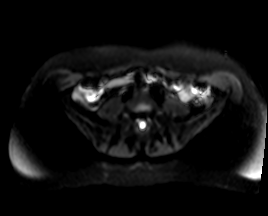
[im 38/76]
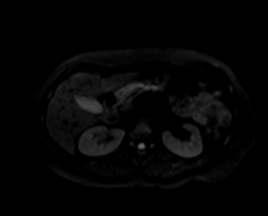
[im 76/76]
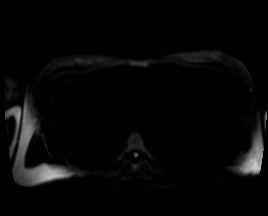

[Series 3: DWI · axial · 6.0mm · 1.42mm/px · z∈[-98,+169]mm · 2 of 38 slices shown (2 of 2)]
[im 1/38]
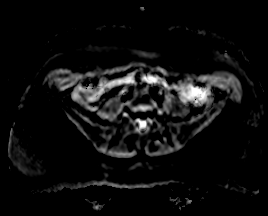
[im 38/38]
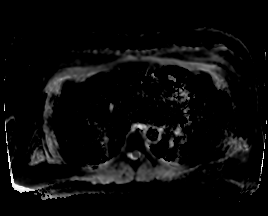

[Series 4: T2 fat-sat · axial · 6.0mm · 1.19mm/px · 1 of 36 slices shown]
[im 1/36]
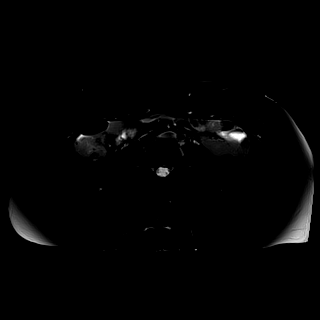

[Series 7: cor haste · coronal · 6.0mm · 1.48mm/px · 1 of 34 slices shown]
[im 1/34]
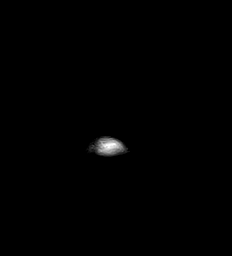

[Series 8: bSSFP · axial · 6.0mm · 1.98mm/px · 1 of 44 slices shown]
[im 1/44]
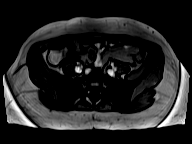

[Series 9: T1 dynamic · axial · 3.0mm · 1.48mm/px · z∈[-144,+141]mm · 3 of 96 slices shown (1 of 10)]
[im 1/96]
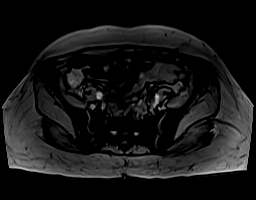
[im 48/96]
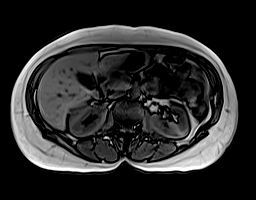
[im 96/96]
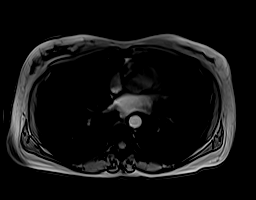

[Series 10: T1 dynamic · axial · 3.0mm · 1.48mm/px · z∈[-144,+141]mm · 3 of 96 slices shown (2 of 10)]
[im 1/96]
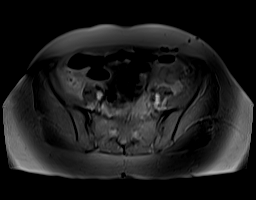
[im 48/96]
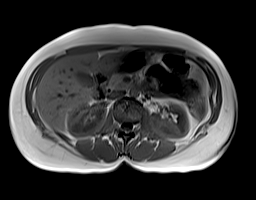
[im 96/96]
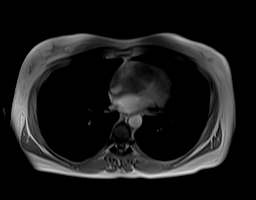

[Series 12: T1 dynamic · axial · 3.0mm · 1.48mm/px · z∈[-144,+141]mm · 3 of 96 slices shown (3 of 10)]
[im 1/96]
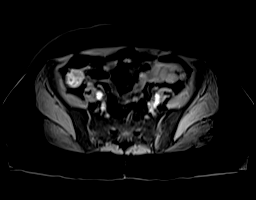
[im 48/96]
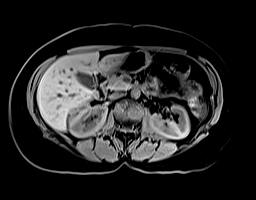
[im 96/96]
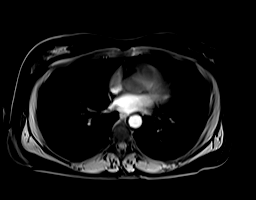

[Series 17: T1 dynamic · axial · 3.0mm · 1.48mm/px · z∈[-144,+141]mm · 3 of 96 slices shown (4 of 10)]
[im 1/96]
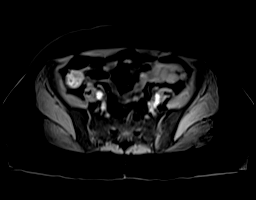
[im 48/96]
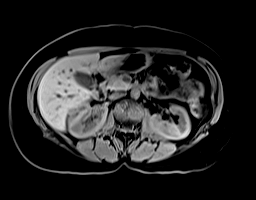
[im 96/96]
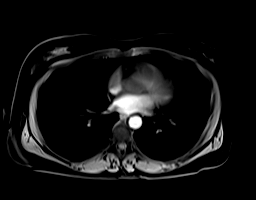

[Series 19: T1 dynamic · axial · 3.0mm · 1.48mm/px · z∈[-144,+141]mm · 3 of 96 slices shown (5 of 10)]
[im 1/96]
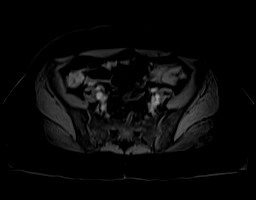
[im 48/96]
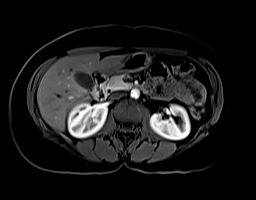
[im 96/96]
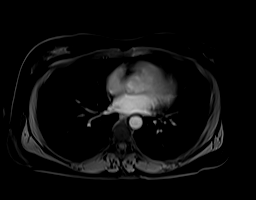

[Series 20: T1 dynamic · axial · 3.0mm · 1.48mm/px · z∈[-144,+141]mm · 3 of 95 slices shown (6 of 10)]
[im 1/95]
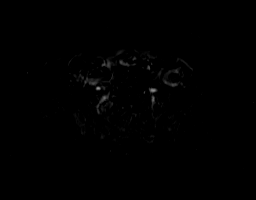
[im 48/95]
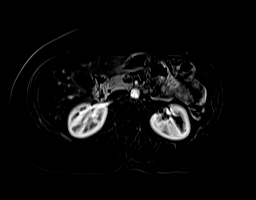
[im 95/95]
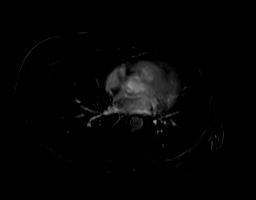

[Series 22: T1 dynamic · axial · 3.0mm · 1.48mm/px · z∈[-144,+141]mm · 3 of 96 slices shown (7 of 10)]
[im 1/96]
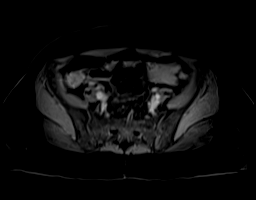
[im 48/96]
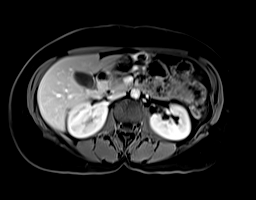
[im 96/96]
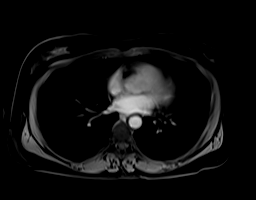

[Series 23: T1 dynamic · axial · 3.0mm · 1.48mm/px · z∈[-144,+141]mm · 3 of 96 slices shown (8 of 10)]
[im 1/96]
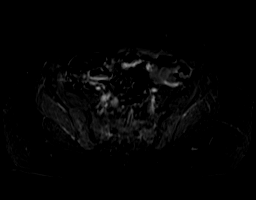
[im 48/96]
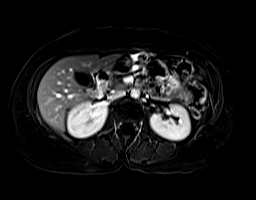
[im 96/96]
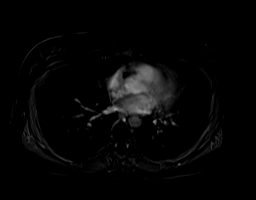

[Series 25: T1 dynamic · axial · 3.0mm · 1.48mm/px · z∈[-144,+141]mm · 3 of 96 slices shown (9 of 10)]
[im 1/96]
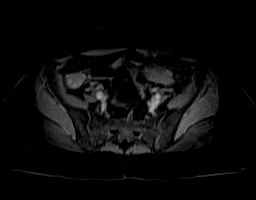
[im 48/96]
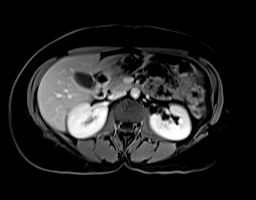
[im 96/96]
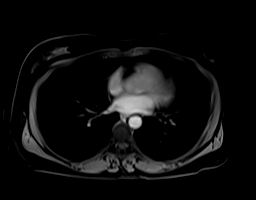

[Series 26: T1 dynamic · axial · 3.0mm · 1.48mm/px · z∈[-144,+141]mm · 3 of 96 slices shown (10 of 10)]
[im 1/96]
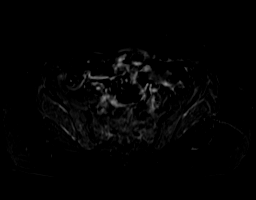
[im 48/96]
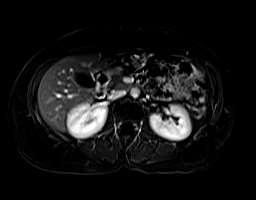
[im 96/96]
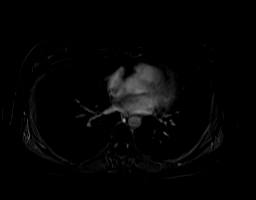

[Series 27: ax_haste_mbh · axial · 6.0mm · 1.19mm/px · 1 of 41 slices shown]
[im 1/41]
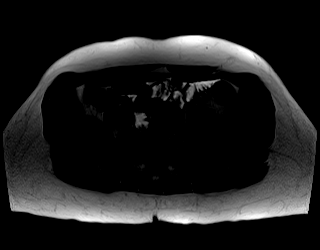

[Series 29: cor_vibe_dixon_delayed_w · coronal · 3.0mm · 2.34mm/px · 3 of 104 slices shown]
[im 1/104]
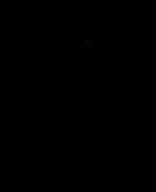
[im 52/104]
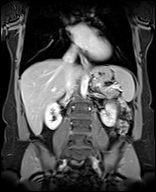
[im 104/104]
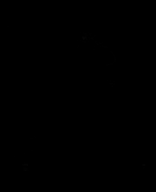

[Series 31: ax_dixon_delayed_w_reg · axial · 3.0mm · 1.48mm/px · z∈[-144,+141]mm · 3 of 96 slices shown]
[im 1/96]
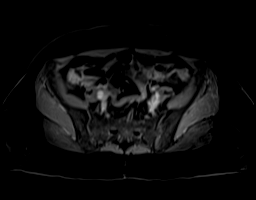
[im 48/96]
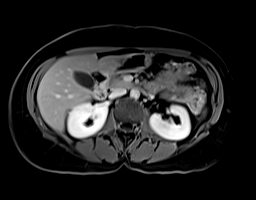
[im 96/96]
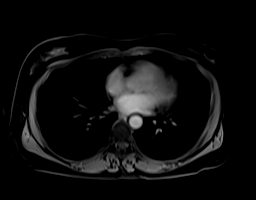

[Series 32: ax_dixon_delayed_w_reg_sub · axial · 3.0mm · 1.48mm/px · z∈[-144,+141]mm · 3 of 96 slices shown]
[im 1/96]
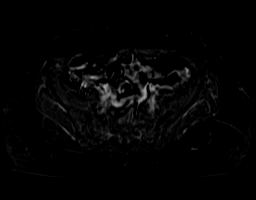
[im 48/96]
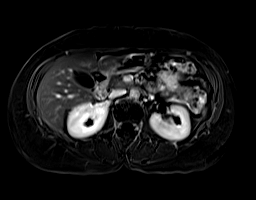
[im 96/96]
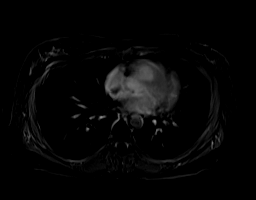

[48 of 48 positions shown; findings below may reference images not displayed]

FINDINGS: Lower chest: Masslike signal abnormality in the right posterior
midthoracic intercostal space/ribs measuring 15 mm and only evident
on the coronal haste pulse sequence image [DATE].

Hepatobiliary: Mild diffuse hepatic steatosis. Well-circumscribed T2
hyperintense 15 mm segment BOLDRINI/VIII hepatic lesion on image [DATE]
demonstrates some thin enhancing septations which arise from macro
lobulations, consistent with benign hepatic cysts.
Well-circumscribed T2 hyperintense 9 mm segment IV hepatic lesion on
image [DATE] is consistent with a cyst there is additional tiny cyst
in the right lobe of the liver on image 15/27. No solid enhancing
hepatic lesions.

Gallbladder is unremarkable.  No biliary ductal dilation.

Pancreas: Intrinsic T1 signal of the pancreatic parenchyma is within
normal limits. Homogeneous enhancement of the pancreatic parenchyma
with postcontrast administration. No pancreatic ductal dilation. No
cystic or solid hyperenhancing pancreatic lesion identified.

Spleen:  Normal size spleen without focal splenic lesion.

Adrenals/Urinary Tract: Bilateral adrenal glands appear normal. No
hydronephrosis. No solid enhancing renal mass.

Stomach/Bowel: Visualized portions within the abdomen are
unremarkable.

Vascular/Lymphatic: No pathologically enlarged lymph nodes
identified. No abdominal aortic aneurysm demonstrated.

Other:  No abdominal free fluid.

Musculoskeletal: No suspicious bone lesions identified.
IMPRESSION: 1. Hepatic cysts which correspond with the lesion seen on prior
ultrasound. No solid enhancing hepatic lesions.
2. Masslike 15 mm signal abnormality in the right posterior
midthoracic intercostal space/ribs, only seen on the coronal haste
sequence and nonspecific possibly reflecting artifact. However, mass
lesion not excluded consider further evaluation with direct
visualization/physical exam and contrasted chest CT if clinically
indicated.
3. Mild diffuse hepatic steatosis.

## 2021-12-12 MED ORDER — GADOBUTROL 1 MMOL/ML IV SOLN
7.0000 mL | Freq: Once | INTRAVENOUS | Status: AC | PRN
  Administered 2021-12-12: 7 mL via INTRAVENOUS

## 2021-12-13 ENCOUNTER — Other Ambulatory Visit (HOSPITAL_COMMUNITY): Payer: BC Managed Care – PPO

## 2021-12-16 ENCOUNTER — Telehealth: Payer: Self-pay | Admitting: Gastroenterology

## 2021-12-16 NOTE — Telephone Encounter (Signed)
Patient returned your call, please advise. Stated that she will be with patients from 2-5.

## 2021-12-17 ENCOUNTER — Ambulatory Visit: Payer: BC Managed Care – PPO | Admitting: Gastroenterology

## 2021-12-18 ENCOUNTER — Telehealth: Payer: Self-pay

## 2021-12-18 NOTE — Telephone Encounter (Signed)
Spoke with patient regarding results & sent to her MyChart as well. Patient instructed to follow up with PCP regarding MRI findings. No further questions.  ?

## 2021-12-25 ENCOUNTER — Encounter: Payer: Self-pay | Admitting: Nurse Practitioner

## 2022-01-03 ENCOUNTER — Encounter: Payer: Self-pay | Admitting: Nurse Practitioner

## 2022-01-03 ENCOUNTER — Other Ambulatory Visit: Payer: Self-pay

## 2022-01-03 ENCOUNTER — Ambulatory Visit: Payer: BC Managed Care – PPO | Admitting: Nurse Practitioner

## 2022-01-03 VITALS — BP 104/62 | HR 65 | Temp 97.1°F | Resp 12 | Ht 63.5 in | Wt 156.2 lb

## 2022-01-03 DIAGNOSIS — R222 Localized swelling, mass and lump, trunk: Secondary | ICD-10-CM | POA: Diagnosis not present

## 2022-01-03 NOTE — Progress Notes (Signed)
? ?Established Patient Office Visit ? ?Subjective:  ?Patient ID: Amy Marks, female    DOB: 10/05/1971  Age: 51 y.o. MRN: [id number] ? ?CC:  ?Chief Complaint  ?Patient presents with  ? Discuss CT scan result  ? ? ?HPI ?Amy Marks presents for f/u MRI RUQ from her GI specialist ? ?Patient saw me on 0/20/2023 for a f/u from urgent care where she was having RUQ pain. Had a joint discussion and pursued an RUQ [place]. It showed possible mass and I referred her to her GI provider. Dr Fuller Plan ordered an MRI of liver and there was a possible incidental finding of a 15 mm mass on the right posterior midthoracic region. Patient here today to follow up on that and to discuss next steps.  ? ?Patient has been a smoker on and off for the past 30 years at 0.5ppd. States she has had periods of cessation but has went back to it. She has at least approx 10 packs years up to 15 pack year. Patient also has a strong family history of breast cancer. ? ?Past Medical History:  ?Diagnosis Date  ? Anxiety   ? Depression   ? H. pylori infection 2018  ? treated with abx  ? IBS (irritable bowel syndrome)   ? OCD (obsessive compulsive disorder)   ? Seasonal allergies   ? Social anxiety disorder 09/12/2013  ? ? ?Past Surgical History:  ?Procedure Laterality Date  ? DILITATION & CURRETTAGE/HYSTROSCOPY WITH NOVASURE ABLATION  09/03/2012  ? Procedure: DILATATION & CURETTAGE/HYSTEROSCOPY WITH NOVASURE ABLATION;  Surgeon: Daria Pastures, MD;  Location: [facility];  Service: Gynecology;  Laterality: N/A;  ? LAPAROSCOPIC TUBAL LIGATION  09/03/2012  ? Procedure: LAPAROSCOPIC TUBAL LIGATION;  Surgeon: Daria Pastures, MD;  Location: [facility];  Service: Gynecology;  Laterality: Bilateral;  FILSHIE CLIPS  ? LAPAROSCOPY  10/14/2012  ? Procedure: LAPAROSCOPY OPERATIVE with LSO and LOA;  Surgeon: Farrel Gobble. Harrington Challenger, MD;  Location: [facility];  Service: Gynecology;  Laterality: N/A;  ? LAPAROTOMY  10/14/2012  ? Procedure: EXPLORATORY LAPAROTOMY;  Surgeon: Farrel Gobble.  Harrington Challenger, MD;  Location: [facility];  Service: Gynecology;  Laterality: N/A;  ? NASAL SEPTUM SURGERY    ? OOPHORECTOMY  10/14/2012  ? Left ovary removed--hx "ruptured chocolate cyst"  ? TONSILLECTOMY    ? ? ?Family History  ?Problem Relation Age of Onset  ? Depression Mother   ? Fibromyalgia Mother   ? Breast cancer Mother 13  ? Rheum arthritis Mother   ? Alcohol abuse Father   ? Breast cancer Sister 20  ? Breast cancer Paternal Aunt 32  ? Diverticulitis Paternal Aunt   ? Breast cancer Paternal Aunt   ? Cancer Paternal Uncle   ? Heart attack Paternal Uncle   ? Emphysema Maternal Grandmother   ?     Dec COPD  ? Breast cancer Maternal Grandmother 60  ?     times 2  ? Multiple sclerosis Maternal Grandfather   ? Cancer Other 42  ?     rectal cancer  ? ? ?Social History  ? ?Socioeconomic History  ? Marital status: Divorced  ?  Spouse name: Not on file  ? Number of children: 2  ? Years of education: Not on file  ? Highest education level: Not on file  ?Occupational History  ? Occupation: DENTAL HYGIENST  ?  Employer: [employer]  ?Tobacco Use  ? Smoking status: Every Day  ?  Packs/day: 0.50  ?  Years: 30.00  ?  Pack years: 15.00  ?  Types: Cigarettes  ? Smokeless tobacco: Never  ? Tobacco comments:  ?  started smoking again 2015  ?Vaping Use  ? Vaping Use: Never used  ?Substance and Sexual Activity  ? Alcohol use: Not Currently  ? Drug use: Never  ? Sexual activity: Not Currently  ?  Birth control/protection: Surgical  ?  Comment: Tubal  ?Other Topics Concern  ? Not on file  ?Social History Narrative  ? Divorced. Two children, daughter and son. Works as a Copywriter, advertising.   ? ?Social Determinants of Health  ? ?Financial Resource Strain: Not on file  ?Food Insecurity: Not on file  ?Transportation Needs: Not on file  ?Physical Activity: Not on file  ?Stress: Not on file  ?Social Connections: Not on file  ?Intimate Partner Violence: Not on file  ? ? ?Outpatient Medications Prior to Visit  ?Medication Sig Dispense Refill  ?  propranolol (INDERAL) 10 MG tablet 1 - 2 tablets 30 minutes before social situation 60 tablet 5  ? sertraline (ZOLOFT) 100 MG tablet Take 1 tablet (100 mg total) by mouth daily. 90 tablet 3  ? buPROPion (WELLBUTRIN SR) 150 MG 12 hr tablet 1 tablet a day x 3 days then 1 tablet twice a day, at least 8 hours apart, second dose no later than 6 pm. (Patient not taking: Reported on 11/08/2021) 60 tablet 2  ? ?No facility-administered medications prior to visit.  ? ? ?No Known Allergies ? ?ROS ?Review of Systems  ?Constitutional:  Negative for chills, fever and unexpected weight change.  ?Respiratory:  Negative for cough and shortness of breath.   ?Cardiovascular:  Negative for chest pain.  ?Gastrointestinal:  Negative for diarrhea, nausea and vomiting.  ?Skin:  Negative for color change and rash.  ? ?  ?Objective:  ?  ?Physical Exam ?Vitals and nursing note reviewed.  ?Constitutional:   ?   Appearance: Normal appearance.  ?Cardiovascular:  ?   Rate and Rhythm: Normal rate and regular rhythm.  ?   Heart sounds: Normal heart sounds.  ?Pulmonary:  ?   Effort: Pulmonary effort is normal.  ?   Breath sounds: Normal breath sounds.  ?Chest:  ?   Chest wall: No tenderness.  ?Abdominal:  ?   General: Bowel sounds are normal. There is no distension.  ?   Palpations: There is no mass.  ?   Tenderness: There is no abdominal tenderness.  ?   Hernia: No hernia is present.  ?Skin: ?   General: Skin is warm.  ?   Comments: Unable to see or palpate mass that was described on MRI  ?Neurological:  ?   Mental Status: She is alert.  ? ? ?BP 104/62   Pulse 65   Temp (!) 97.1 ?F (36.2 ?C)   Resp 12   Ht 5' 3.5" (1.613 m)   Wt 156 lb 4 oz (70.9 kg)   LMP 11/20/2020   SpO2 99%   BMI 27.24 kg/m?  ?Wt Readings from Last 3 Encounters:  ?01/03/22 156 lb 4 oz (70.9 kg)  ?11/29/21 157 lb 12.8 oz (71.6 kg)  ?11/08/21 158 lb 6 oz (71.8 kg)  ? ? ? ?There are no preventive care reminders to display for this patient. ? ?There are no preventive care  reminders to display for this patient. ? ?Lab Results  ?Component Value Date  ? TSH 1.54 09/27/2021  ? ?Lab Results  ?Component Value Date  ? WBC 5.3 11/08/2021  ? HGB 13.9  11/08/2021  ? HCT 41.0 11/08/2021  ? MCV 91.7 11/08/2021  ? PLT 161.0 11/08/2021  ? ?Lab Results  ?Component Value Date  ? NA 138 11/08/2021  ? K 4.5 11/08/2021  ? CO2 28 11/08/2021  ? GLUCOSE 86 11/08/2021  ? BUN 15 11/08/2021  ? CREATININE 0.90 11/08/2021  ? BILITOT 0.6 11/08/2021  ? ALKPHOS 46 11/08/2021  ? AST 17 11/08/2021  ? ALT 15 11/08/2021  ? PROT 6.8 11/08/2021  ? ALBUMIN 4.3 11/08/2021  ? CALCIUM 9.3 11/08/2021  ? GFR 75.27 11/08/2021  ? ?Lab Results  ?Component Value Date  ? CHOL 201 (H) 09/27/2021  ? ?Lab Results  ?Component Value Date  ? HDL 71.70 09/27/2021  ? ?Lab Results  ?Component Value Date  ? LDLCALC 122 (H) 09/27/2021  ? ?Lab Results  ?Component Value Date  ? TRIG 38.0 09/27/2021  ? ?Lab Results  ?Component Value Date  ? CHOLHDL 3 09/27/2021  ? ?Lab Results  ?Component Value Date  ? HGBA1C 5.3 09/27/2021  ? ? ?  ?Assessment & Plan:  ? ?Problem List Items Addressed This Visit   ? ?  ? Other  ? Chest mass - Primary  ?  Patient had MRI of the liver performed with her GI provider Dr. Fuller Plan.  Upon report it was a 15 mm right posterior mid thoracic abnormality/lesion that was not discretely defined.  Recommended physical exam, possible CT chest with contrast and follow-up.  Given patient has high risk for cancer given family history and she is a smoker we had a joint discussion and elected to pursue CT chest with contrast.  We will contact patient once results of scan are in.  Follow-up as needed ?  ?  ? Relevant Orders  ? CT CHEST W CONTRAST  ? ? ?No orders of the defined types were placed in this encounter. ? ? ?Follow-up: Return in about 9 months (around 09/28/2022) for or after for CPE.  ? ?This visit occurred during the SARS-CoV-2 public health emergency.  Safety protocols were in place, including screening questions prior to  the visit, additional usage of staff PPE, and extensive cleaning of exam room while observing appropriate contact time as indicated for disinfecting solutions.  ? ?Romilda Garret, NP ?

## 2022-01-03 NOTE — Patient Instructions (Addendum)
Nice to see you today ?I will be in touch once I have the CT scan results ?Follow up with me after 09/27/2022 for your physical  ?

## 2022-01-03 NOTE — Assessment & Plan Note (Signed)
Patient had MRI of the liver performed with her GI provider Dr. Fuller Plan.  Upon report it was a 15 mm right posterior mid thoracic abnormality/lesion that was not discretely defined.  Recommended physical exam, possible CT chest with contrast and follow-up.  Given patient has high risk for cancer given family history and she is a smoker we had a joint discussion and elected to pursue CT chest with contrast.  We will contact patient once results of scan are in.  Follow-up as needed ?

## 2022-01-04 ENCOUNTER — Encounter: Payer: Self-pay | Admitting: *Deleted

## 2022-01-16 ENCOUNTER — Ambulatory Visit
Admission: RE | Admit: 2022-01-16 | Discharge: 2022-01-16 | Disposition: A | Discharge status: Home / Self Care | Payer: BC Managed Care – PPO | Source: Ambulatory Visit | Attending: Nurse Practitioner | Admitting: Nurse Practitioner

## 2022-01-16 DIAGNOSIS — R911 Solitary pulmonary nodule: Secondary | ICD-10-CM | POA: Diagnosis not present

## 2022-01-16 DIAGNOSIS — J929 Pleural plaque without asbestos: Secondary | ICD-10-CM | POA: Diagnosis not present

## 2022-01-16 DIAGNOSIS — R222 Localized swelling, mass and lump, trunk: Secondary | ICD-10-CM

## 2022-01-16 DIAGNOSIS — K7689 Other specified diseases of liver: Secondary | ICD-10-CM | POA: Diagnosis not present

## 2022-01-16 IMAGING — CT CT CHEST W/ CM
1 series · 15 of 34 positions shown, 19 images · IV contrast (agent unspecified)
Comparison: MRI [DATE].

CLINICAL DATA: Lung nodule.

EXAM:
CT CHEST WITH CONTRAST
TECHNIQUE: Multidetector CT imaging of the chest was performed during
intravenous contrast administration.

[Series 2: chest with 2mm st · axial · 0.72mm/px · z∈[-276,-20]mm · 15 of 152 slices shown, 19 images]
[im 12/152  mediastinal]
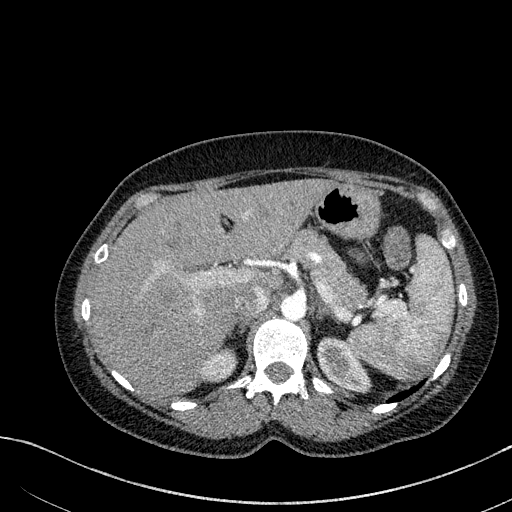
[im 12/152  lung]
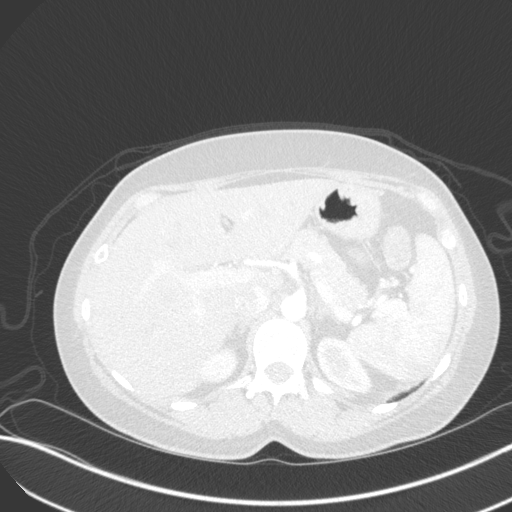
[im 23/152  lung]
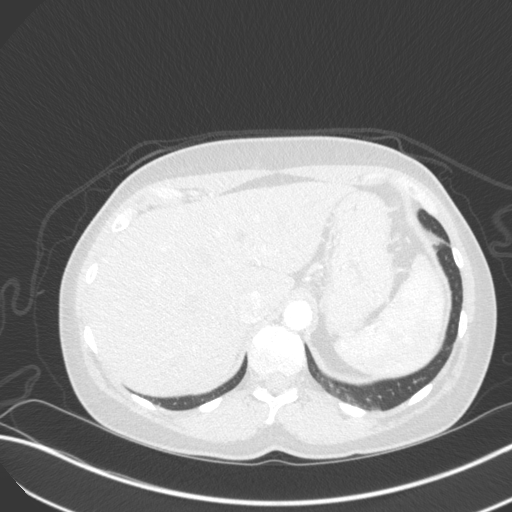
[im 31/152  lung]
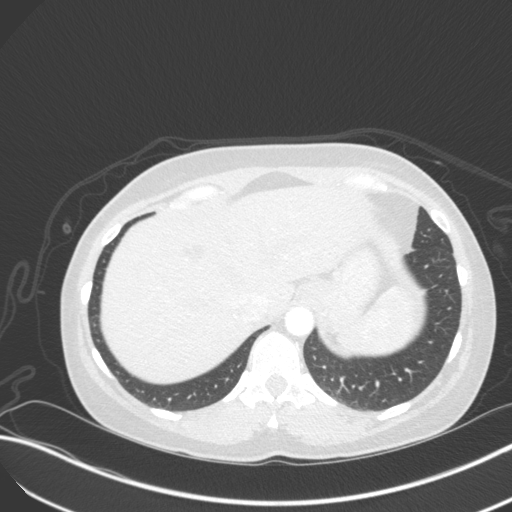
[im 40/152  lung]
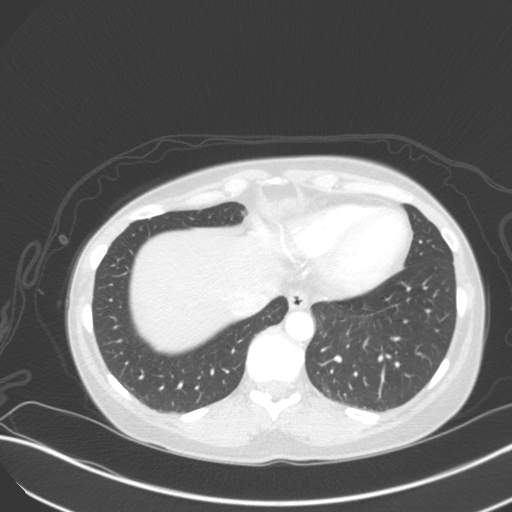
[im 51/152  mediastinal]
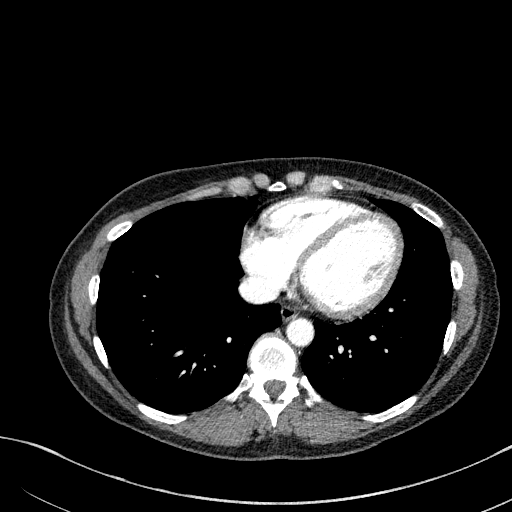
[im 51/152  lung]
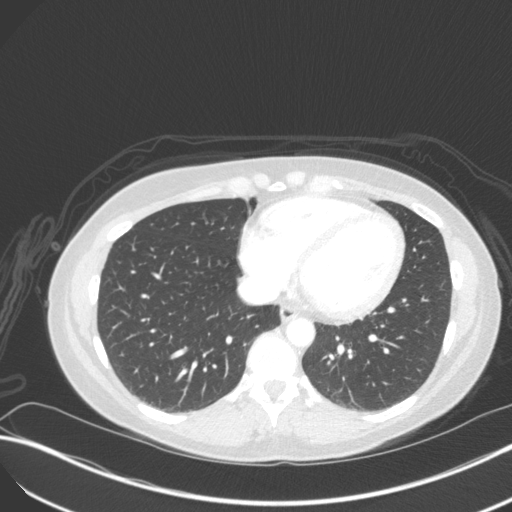
[im 61/152  lung]
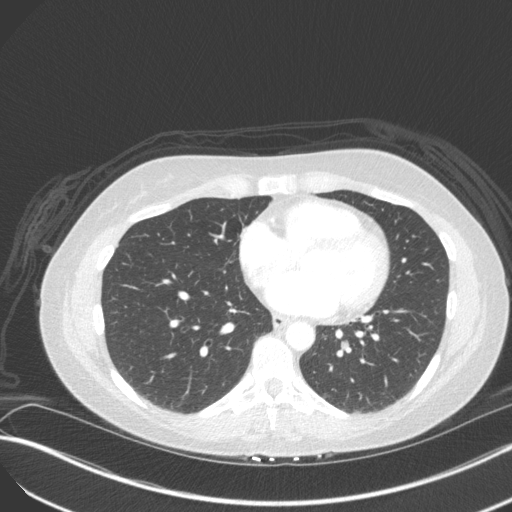
[im 68/152  lung]
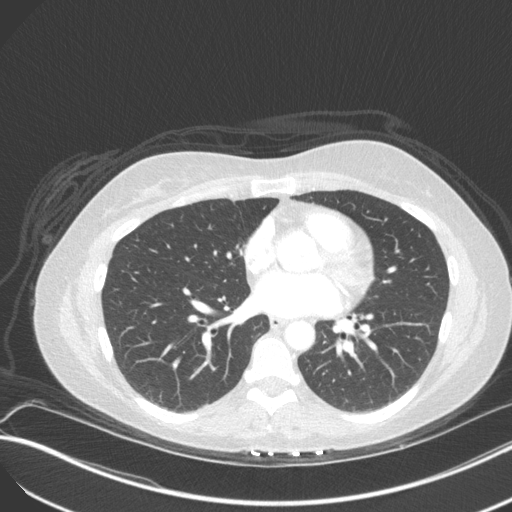
[im 79/152  lung]
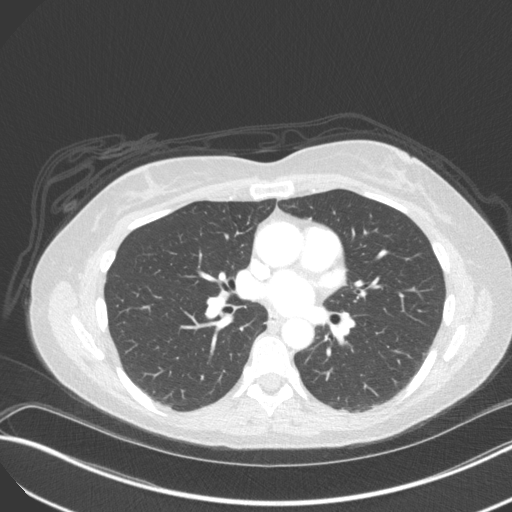
[im 84/152  mediastinal]
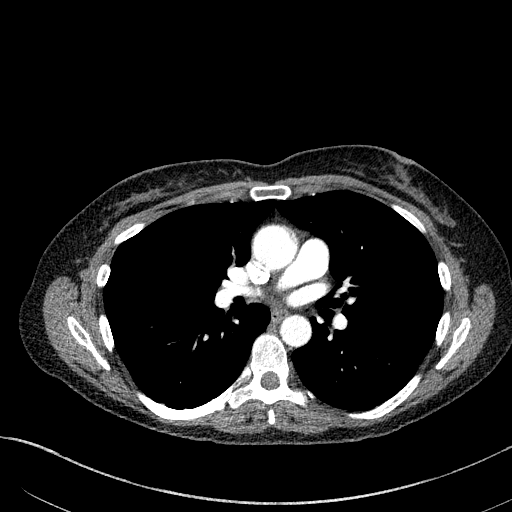
[im 84/152  lung]
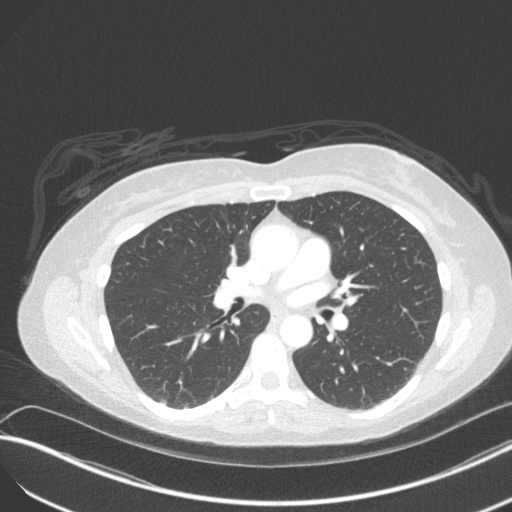
[im 91/152  lung]
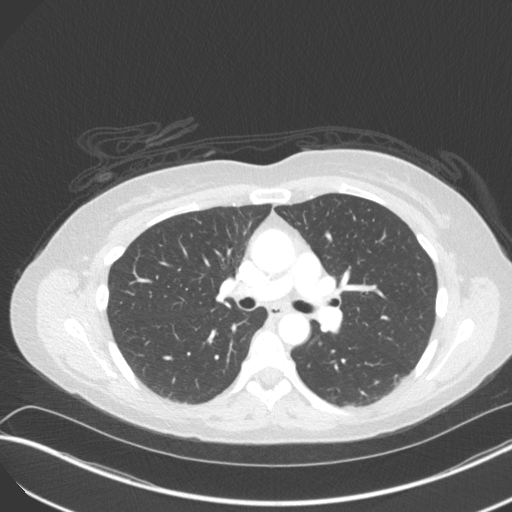
[im 101/152  lung]
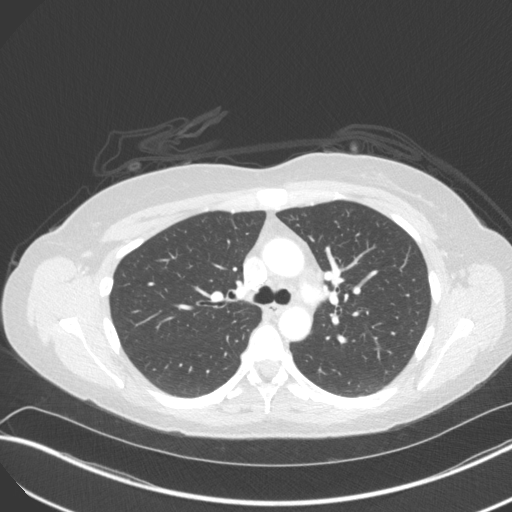
[im 112/152  lung]
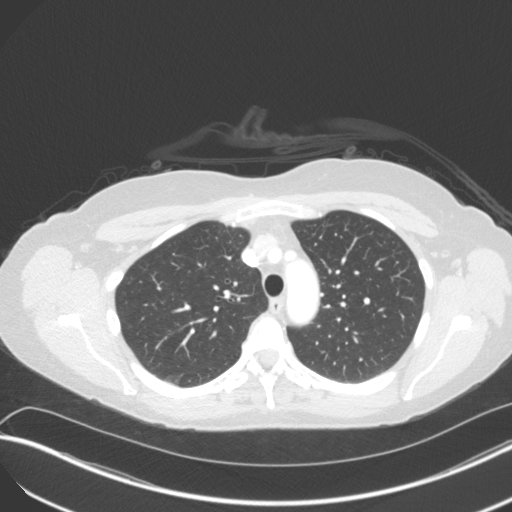
[im 121/152  mediastinal]
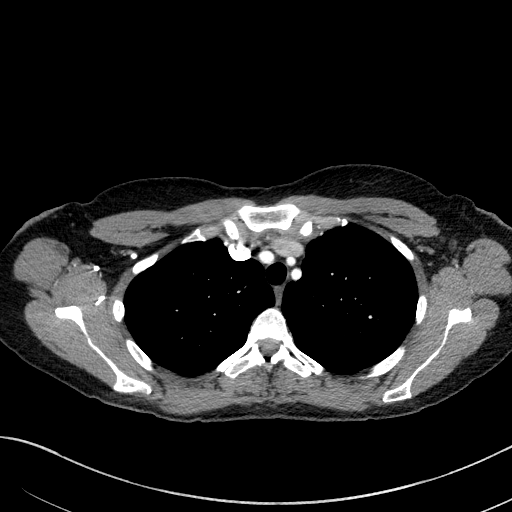
[im 121/152  lung]
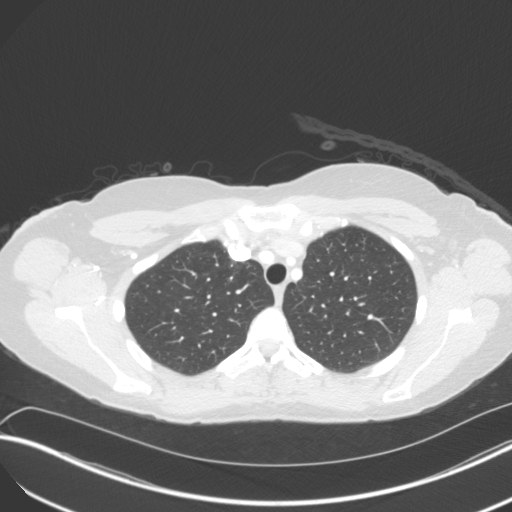
[im 129/152  lung]
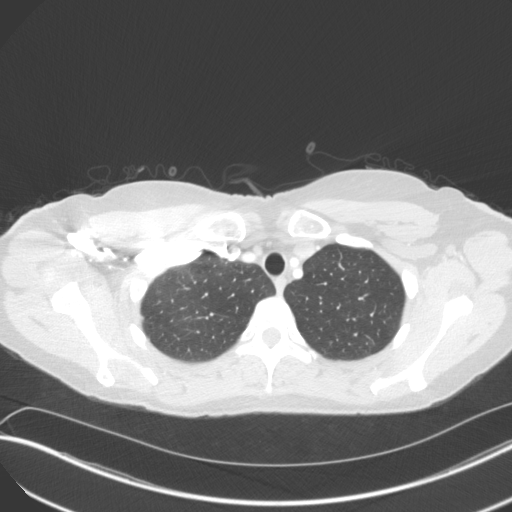
[im 140/152  lung]
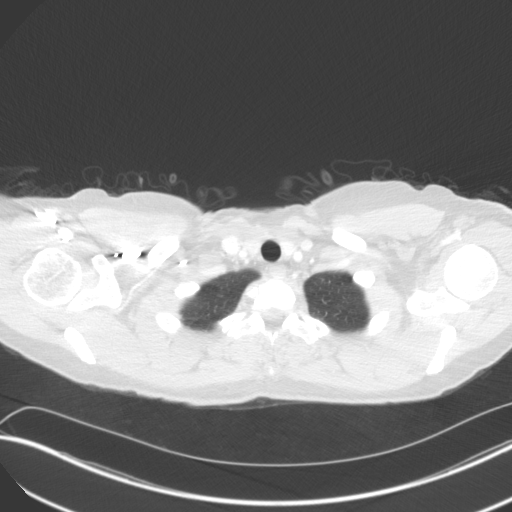

[15 of 34 positions shown; findings below may reference images not displayed]

RADIATION DOSE REDUCTION: This exam was performed according to the
departmental dose-optimization program which includes automated
exposure control, adjustment of the mA and/or kV according to
patient size and/or use of iterative reconstruction technique.

CONTRAST:  75mL [N0] IOPAMIDOL ([N0]) INJECTION 61%
FINDINGS: Cardiovascular: No significant vascular findings. Normal heart size.
No pericardial effusion.

Mediastinum/Nodes: No enlarged mediastinal, hilar, or axillary lymph
nodes. Thyroid gland, trachea, and esophagus demonstrate no
significant findings.

Lungs/Pleura: Lungs are clear. No pleural effusion or pneumothorax.
Focal calcified pleural plaque is noted in the left posterior
costophrenic sulcus.

Upper Abdomen: At least 2 hepatic cysts are noted as described on
recent MRI.

Musculoskeletal: No chest wall abnormality. No acute or significant
osseous findings.
IMPRESSION: Focal calcified pleural plaque is noted in the left posterior
costophrenic sulcus. No other significant abnormality is noted in
the chest.

## 2022-01-16 MED ORDER — IOPAMIDOL (ISOVUE-300) INJECTION 61%
75.0000 mL | Freq: Once | INTRAVENOUS | Status: AC | PRN
  Administered 2022-01-16: 75 mL via INTRAVENOUS

## 2022-02-28 ENCOUNTER — Ambulatory Visit: Payer: BC Managed Care – PPO | Admitting: Nurse Practitioner

## 2022-02-28 ENCOUNTER — Ambulatory Visit: Payer: BC Managed Care – PPO | Admitting: Obstetrics & Gynecology

## 2022-03-28 ENCOUNTER — Ambulatory Visit: Payer: BC Managed Care – PPO | Admitting: Obstetrics & Gynecology

## 2022-04-18 ENCOUNTER — Ambulatory Visit (INDEPENDENT_AMBULATORY_CARE_PROVIDER_SITE_OTHER): Payer: BC Managed Care – PPO | Admitting: Obstetrics & Gynecology

## 2022-04-18 ENCOUNTER — Other Ambulatory Visit (HOSPITAL_COMMUNITY)
Admission: RE | Admit: 2022-04-18 | Discharge: 2022-04-18 | Disposition: A | Discharge status: Home / Self Care | Payer: BC Managed Care – PPO | Source: Ambulatory Visit | Attending: Nurse Practitioner | Admitting: Nurse Practitioner

## 2022-04-18 ENCOUNTER — Encounter: Payer: Self-pay | Admitting: Obstetrics & Gynecology

## 2022-04-18 VITALS — BP 104/78 | HR 72 | Resp 16 | Ht 62.75 in | Wt 158.0 lb

## 2022-04-18 DIAGNOSIS — Z01419 Encounter for gynecological examination (general) (routine) without abnormal findings: Secondary | ICD-10-CM | POA: Diagnosis not present

## 2022-04-18 DIAGNOSIS — Z9851 Tubal ligation status: Secondary | ICD-10-CM

## 2022-04-18 DIAGNOSIS — Z780 Asymptomatic menopausal state: Secondary | ICD-10-CM

## 2022-04-18 DIAGNOSIS — F17210 Nicotine dependence, cigarettes, uncomplicated: Secondary | ICD-10-CM | POA: Diagnosis not present

## 2022-04-18 NOTE — Progress Notes (Signed)
Amy Marks 06/29/1971 [id number]   History:    51 y.o. G2P2L2 Divorced.  Boyfriend x 4 months. S/P TL.  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  RP:  Established patient presenting for annual gyn exam   HPI:  Postmenopause, well on no HRT.  No PMB.  No pelvic pain.  No pain with IC.  Pap Neg 02/2021.  Pap/HPV HR, Gono-Chlam today.   HPV LEEP in early 20's.  Breasts normal.  Mammo Neg 03/2021.  Will schedule screening mammo now.  Lifetime risk Breast cancer 28%. Sister was 50 when dx Breast CA, Mother dx Breast Ca. Colono 01/2013.  BMI 28.21.  Needs to increase fitness activities.  Plans to quit smoking, smoking 1/2 pack a day now.  Health labs with Fam MD.   Past medical history,surgical history, family history and social history were all reviewed and documented in the EPIC chart.  Gynecologic History Patient's last menstrual period was 11/20/2020.  Obstetric History OB History  Gravida Para Term Preterm AB Living  '2 2 2     2  '$ SAB IAB Ectopic Multiple Live Births               # Outcome Date GA Lbr Len/2nd Weight Sex Delivery Anes PTL Lv  2 Term           1 Term              ROS: A ROS was performed and pertinent positives and negatives are included in the history.  GENERAL: No fevers or chills. HEENT: No change in vision, no earache, sore throat or sinus congestion. NECK: No pain or stiffness. CARDIOVASCULAR: No chest pain or pressure. No palpitations. PULMONARY: No shortness of breath, cough or wheeze. GASTROINTESTINAL: No abdominal pain, nausea, vomiting or diarrhea, melena or bright red blood per rectum. GENITOURINARY: No urinary frequency, urgency, hesitancy or dysuria. MUSCULOSKELETAL: No joint or muscle pain, no back pain, no recent trauma. DERMATOLOGIC: No rash, no itching, no lesions. ENDOCRINE: No polyuria, polydipsia, no heat or cold intolerance. No recent change in weight. HEMATOLOGICAL: No anemia or easy bruising or bleeding.  NEUROLOGIC: No headache, seizures, numbness, tingling or weakness. PSYCHIATRIC: No depression, no loss of interest in normal activity or change in sleep pattern.     Exam:   BP 104/78   Pulse 72   Resp 16   Ht 5' 2.75" (1.594 m)   Wt 158 lb (71.7 kg)   LMP 11/20/2020 Comment: BTL  BMI 28.21 kg/m   Body mass index is 28.21 kg/m.  General appearance : Well developed well nourished female. No acute distress HEENT: Eyes: no retinal hemorrhage or exudates,  Neck supple, trachea midline, no carotid bruits, no thyroidmegaly Lungs: Clear to auscultation, no rhonchi or wheezes, or rib retractions  Heart: Regular rate and rhythm, no murmurs or gallops Breast:Examined in sitting and supine position were symmetrical in appearance, no palpable masses or tenderness,  no skin retraction, no nipple inversion, no nipple discharge, no skin discoloration, no axillary or supraclavicular lymphadenopathy Abdomen: no palpable masses or tenderness, no rebound or guarding Extremities: no edema or skin discoloration or tenderness  Pelvic: Vulva: Normal             Vagina: No gross lesions or discharge  Cervix: No gross lesions or discharge.  Pap/HPV HR, Gono-Chlam  Uterus  AV, normal size, shape and consistency, non-tender and mobile  Adnexa  Without masses or tenderness  Anus: Normal  Assessment/Plan:  51 y.o. female for annual exam   1. Encounter for routine gynecological examination with Papanicolaou smear of cervix Postmenopause, well on no HRT.  No PMB.  No pelvic pain.  No pain with IC.  Pap Neg 02/2021.  Pap/HPV HR, Gono-Chlam today.   HPV LEEP in early 20's.  Breasts normal.  Mammo Neg 03/2021.  Will schedule screening mammo now.  Lifetime risk Breast cancer 28%. Sister was 50 when dx Breast CA, Mother dx Breast Ca. Colono 01/2013.  BMI 28.21.  Needs to increase fitness activities.  Plans to quit smoking, smoking 1/2 pack a day now.  Health labs with Fam MD. - Cytology - PAP( [place])  2. S/P  tubal ligation  3. Postmenopause Postmenopause, well on no HRT.  No PMB.  No pelvic pain.  No pain with IC.  Vit D, Ca++ 1.5 g/d total and regular weight bearing activities.  4. Cigarette smoker  Counseling done on quitting cigarette smoking.  Princess Bruins MD, 8:17 AM 04/18/2022

## 2022-04-23 LAB — CYTOLOGY - PAP
Chlamydia: NEGATIVE
Comment: NEGATIVE
Comment: NEGATIVE
Comment: NORMAL
Diagnosis: UNDETERMINED — AB
High risk HPV: NEGATIVE
Neisseria Gonorrhea: NEGATIVE

## 2022-04-29 ENCOUNTER — Encounter: Payer: Self-pay | Admitting: Obstetrics & Gynecology

## 2022-04-29 MED ORDER — FLUCONAZOLE 150 MG PO TABS: 150.0000 mg | ORAL_TABLET | Freq: Once | ORAL | 0 refills | Status: AC

## 2022-08-26 ENCOUNTER — Telehealth: Payer: BC Managed Care – PPO | Admitting: Physician Assistant

## 2022-08-26 DIAGNOSIS — J019 Acute sinusitis, unspecified: Secondary | ICD-10-CM | POA: Diagnosis not present

## 2022-08-26 DIAGNOSIS — B9689 Other specified bacterial agents as the cause of diseases classified elsewhere: Secondary | ICD-10-CM | POA: Diagnosis not present

## 2022-08-26 MED ORDER — DOXYCYCLINE HYCLATE 100 MG PO TABS: 100.0000 mg | ORAL_TABLET | Freq: Two times a day (BID) | ORAL | 0 refills | Status: DC

## 2022-08-26 NOTE — Progress Notes (Signed)

## 2022-08-26 NOTE — Progress Notes (Signed)
I have spent 5 minutes in review of e-visit questionnaire, review and updating patient chart, medical decision making and response to patient.   Winni Ehrhard Cody Tennie Grussing, PA-C    

## 2022-09-16 ENCOUNTER — Other Ambulatory Visit: Payer: Self-pay | Admitting: Obstetrics & Gynecology

## 2022-09-16 DIAGNOSIS — Z1231 Encounter for screening mammogram for malignant neoplasm of breast: Secondary | ICD-10-CM

## 2022-09-16 DIAGNOSIS — Z853 Personal history of malignant neoplasm of breast: Secondary | ICD-10-CM

## 2022-10-04 ENCOUNTER — Other Ambulatory Visit: Payer: Self-pay | Admitting: Nurse Practitioner

## 2022-10-04 DIAGNOSIS — F419 Anxiety disorder, unspecified: Secondary | ICD-10-CM

## 2022-10-07 ENCOUNTER — Telehealth: Payer: Self-pay | Admitting: Nurse Practitioner

## 2022-10-07 DIAGNOSIS — F32A Depression, unspecified: Secondary | ICD-10-CM

## 2022-10-07 MED ORDER — SERTRALINE HCL 100 MG PO TABS: 100.0000 mg | ORAL_TABLET | Freq: Every day | ORAL | 0 refills | Status: DC

## 2022-10-07 NOTE — Telephone Encounter (Signed)
  Encourage patient to contact the pharmacy for refills or they can request refills through [facility]  Did the patient contact the pharmacy:  no   LAST APPOINTMENT DATE:  Please schedule appointment if longer than 1 year  NEXT APPOINTMENT DATE:12/05/2022  MEDICATION:sertraline sertraline (ZOLOFT) 100 MG tablet  Is the patient out of medication? no  If not, how much is left?5 days  Is this a 90 day supply: yes  PHARMACY: [place], [place] Phone: [phone]  Fax: [phone]      Let patient know to contact pharmacy at the end of the day to make sure medication is ready.  Please notify patient to allow 48-72 hours to process

## 2022-10-07 NOTE — Telephone Encounter (Signed)
Lvmtcb, sent mychart message  

## 2022-10-07 NOTE — Telephone Encounter (Signed)
Please call patient to schedule appointment as instructed. Send back for refill after appointment is scheduled.

## 2022-10-07 NOTE — Telephone Encounter (Signed)
Patient scheduled an appointment on 12/05/22.  Duplicate request see phone note.

## 2022-10-10 ENCOUNTER — Ambulatory Visit
Admission: RE | Admit: 2022-10-10 | Discharge: 2022-10-10 | Disposition: A | Discharge status: Home / Self Care | Payer: BC Managed Care – PPO | Source: Ambulatory Visit | Attending: Obstetrics & Gynecology | Admitting: Obstetrics & Gynecology

## 2022-10-10 DIAGNOSIS — Z853 Personal history of malignant neoplasm of breast: Secondary | ICD-10-CM

## 2022-10-10 DIAGNOSIS — R928 Other abnormal and inconclusive findings on diagnostic imaging of breast: Secondary | ICD-10-CM | POA: Diagnosis not present

## 2022-12-05 ENCOUNTER — Ambulatory Visit (INDEPENDENT_AMBULATORY_CARE_PROVIDER_SITE_OTHER): Payer: BC Managed Care – PPO | Admitting: Nurse Practitioner

## 2022-12-05 ENCOUNTER — Encounter: Payer: Self-pay | Admitting: Nurse Practitioner

## 2022-12-05 VITALS — BP 110/78 | HR 78 | Temp 98.6°F | Ht 62.75 in | Wt 166.0 lb

## 2022-12-05 DIAGNOSIS — L989 Disorder of the skin and subcutaneous tissue, unspecified: Secondary | ICD-10-CM

## 2022-12-05 DIAGNOSIS — M25551 Pain in right hip: Secondary | ICD-10-CM

## 2022-12-05 DIAGNOSIS — Z0000 Encounter for general adult medical examination without abnormal findings: Secondary | ICD-10-CM | POA: Diagnosis not present

## 2022-12-05 DIAGNOSIS — Z720 Tobacco use: Secondary | ICD-10-CM

## 2022-12-05 DIAGNOSIS — Z23 Encounter for immunization: Secondary | ICD-10-CM | POA: Diagnosis not present

## 2022-12-05 DIAGNOSIS — E663 Overweight: Secondary | ICD-10-CM

## 2022-12-05 DIAGNOSIS — Z122 Encounter for screening for malignant neoplasm of respiratory organs: Secondary | ICD-10-CM

## 2022-12-05 LAB — COMPREHENSIVE METABOLIC PANEL
ALT: 12 U/L (ref 0–35)
AST: 18 U/L (ref 0–37)
Albumin: 4.4 g/dL (ref 3.5–5.2)
Alkaline Phosphatase: 54 U/L (ref 39–117)
BUN: 16 mg/dL (ref 6–23)
CO2: 31 mEq/L (ref 19–32)
Calcium: 9.4 mg/dL (ref 8.4–10.5)
Chloride: 104 mEq/L (ref 96–112)
Creatinine, Ser: 0.78 mg/dL (ref 0.40–1.20)
GFR: 88.7 mL/min (ref >60.00)
Glucose, Bld: 83 mg/dL (ref 70–99)
Potassium: 4.5 mEq/L (ref 3.5–5.1)
Sodium: 141 mEq/L (ref 135–145)
Total Bilirubin: 0.5 mg/dL (ref 0.2–1.2)
Total Protein: 6.7 g/dL (ref 6.0–8.3)

## 2022-12-05 LAB — TSH: TSH: 1.82 u[IU]/mL (ref 0.35–5.50)

## 2022-12-05 LAB — CBC
HCT: 41.4 % (ref 36.0–46.0)
Hemoglobin: 14.2 g/dL (ref 12.0–15.0)
MCHC: 34.2 g/dL (ref 30.0–36.0)
MCV: 92 fl (ref 78.0–100.0)
Platelets: 177 10*3/uL (ref 150.0–400.0)
RBC: 4.5 Mil/uL (ref 3.87–5.11)
RDW: 12.6 % (ref 11.5–15.5)
WBC: 4.6 10*3/uL (ref 4.0–10.5)

## 2022-12-05 LAB — HEMOGLOBIN A1C: Hgb A1c MFr Bld: 5.4 % (ref 4.6–6.5)

## 2022-12-05 LAB — LIPID PANEL
Cholesterol: 209 mg/dL — ABNORMAL HIGH (ref 0–200)
HDL: 70.4 mg/dL (ref >39.00)
LDL Cholesterol: 128 mg/dL — ABNORMAL HIGH (ref 0–99)
NonHDL: 138.17
Total CHOL/HDL Ratio: 3
Triglycerides: 52 mg/dL (ref 0.0–149.0)
VLDL: 10.4 mg/dL (ref 0.0–40.0)

## 2022-12-05 NOTE — Assessment & Plan Note (Signed)
Ambiguous in nature.  Did offer x-ray patient politely declined.  Did tell patient use over-the-counter analgesics as needed.  Did encourage exercise inclusive of core exercises follow-up if no improvement we can always x-ray or refer to sports medicine

## 2022-12-05 NOTE — Assessment & Plan Note (Signed)
Discussed age-appropriate immunizations and screening exams.  Patient is up-to-date on Tdap, can get flu vaccine at local pharmacy, update shingles vaccine today.  Patient is up-to-date on mammogram, CRC screening, cervical cancer screening.  Patient was given information about preventative healthcare maintenance with disparate guidance for age range.  Patient was encouraged to start exercising

## 2022-12-05 NOTE — Patient Instructions (Signed)
Nice to see you today Try over the counter medications to help with discomfort If you do not improve we can get an xray and see our sports medicine doctor Follow up with me in 1 year for your next physical and labs, sooner if you need me

## 2022-12-05 NOTE — Progress Notes (Signed)
Established Patient Office Visit  Subjective   Patient ID: ANALYA Marks, female    DOB: 1971/06/16  Age: 52 y.o. MRN: [id number]  Chief Complaint  Patient presents with   Annual Exam    HPI   Skin issue: Has been present for extended period time.  Located on the left anterior nostril.  Red and spider veins.  Does not itch, burn nor is there any discharge.  Patient not had any large sun exposure as of late.  No personal history of skin cancer per patient.  Right hip: states that it has been bothering her for approx 6 months. States that it is with more movement. She can press on it and feel it. States that is a dull pain. States that it is intermittent.  Patient has not tried any over-the-counter medications thus far.  for complete physical and follow up of chronic conditions.  Anxiety: zoloft that is well controlled denies Hi/Si/AVH  Immunizations: -Tetanus: Completed in 2017 -Influenza: at local pharmacy -Shingles: update today -Pneumonia: Too young  -HPV: aged out  Diet: [place]. States that she is eating a shake in the am, protien bar x 4 a day and then a meal at night. Optivia. Coffee and water Exercise: No regular exercise.  Eye exam:  PRN  Dental exam: Completes semi-annually   Pap Smear: Completed in seens Dr Dellis Filbert 04/08/2022  Mammogram: Completed in 10/10/2022  Colonoscopy: Completed in 2014, recall in 10 years due 2024 Lung Cancer Screening: Recent CT of chest done 1 year ago.  Ambulatory referral for LDCT placed today Dexa: Does not qualify  Sleep: States that she goes to bed around 8-9 and gets up at 530 and she feels rested. Intermittent       Review of Systems  Constitutional:  Negative for chills and fever.  Respiratory:  Negative for shortness of breath.   Cardiovascular:  Negative for chest pain and leg swelling.  Gastrointestinal:  Negative for abdominal pain, blood in stool, constipation, diarrhea, nausea and vomiting.       BM dialy    Genitourinary:  Negative for dysuria and hematuria.  Neurological:  Negative for tingling and headaches.  Psychiatric/Behavioral:  Negative for hallucinations and suicidal ideas.       Objective:     BP 110/78   Pulse 78   Temp 98.6 F (37 C) (Temporal)   Ht 5' 2.75" (1.594 m)   Wt 166 lb (75.3 kg)   LMP 11/20/2020 Comment: BTL  SpO2 98%   BMI 29.64 kg/m  BP Readings from Last 3 Encounters:  12/05/22 110/78  04/18/22 104/78  01/03/22 104/62   Wt Readings from Last 3 Encounters:  12/05/22 166 lb (75.3 kg)  04/18/22 158 lb (71.7 kg)  01/03/22 156 lb 4 oz (70.9 kg)      Physical Exam Vitals and nursing note reviewed.  Constitutional:      Appearance: Normal appearance.  HENT:     Right Ear: Tympanic membrane, ear canal and external ear normal.     Left Ear: Tympanic membrane, ear canal and external ear normal.     Mouth/Throat:     Mouth: Mucous membranes are moist.     Pharynx: Oropharynx is clear.  Eyes:     Extraocular Movements: Extraocular movements intact.     Pupils: Pupils are equal, round, and reactive to light.  Cardiovascular:     Rate and Rhythm: Normal rate and regular rhythm.     Pulses: Normal pulses.  Heart sounds: Normal heart sounds.  Pulmonary:     Effort: Pulmonary effort is normal.     Breath sounds: Normal breath sounds.  Abdominal:     General: Bowel sounds are normal. There is no distension.     Palpations: There is no mass.     Tenderness: There is no abdominal tenderness.     Hernia: No hernia is present.  Musculoskeletal:        General: Tenderness present.     Left hip: Bony tenderness present. Normal range of motion. Normal strength.     Right lower leg: No edema.     Left lower leg: No edema.       Legs:     Comments: Worse with flexion adduction and abduction   Lymphadenopathy:     Cervical: No cervical adenopathy.  Skin:    General: Skin is warm.     Findings: Lesion present.       Neurological:     General: No  focal deficit present.     Mental Status: She is alert.     Deep Tendon Reflexes:     Reflex Scores:      Bicep reflexes are 2+ on the right side and 2+ on the left side.      Patellar reflexes are 2+ on the right side and 2+ on the left side.    Comments: Bilateral upper and lower extremity strength 5/5  Psychiatric:        Mood and Affect: Mood normal.        Behavior: Behavior normal.        Thought Content: Thought content normal.        Judgment: Judgment normal.      No results found for any visits on 12/05/22.    The 10-year ASCVD risk score (Arnett DK, et al., 2019) is: 1.9%    Assessment & Plan:   Problem List Items Addressed This Visit       Musculoskeletal and Integument   Skin lesion    Intermittent comes and goes on the soft cartilage part of the left anterior nostril.  Most consistent with telangiectasia        Other   Tobacco use    Patient smoking half pack a day for approximate the past 30 years.  Did encourage patient to stop smoking.  LDCT referral placed today      Relevant Orders   Ambulatory Referral Lung Cancer Screening [facility]   CBC   Comprehensive metabolic panel   Preventative health care - Primary    Discussed age-appropriate immunizations and screening exams.  Patient is up-to-date on Tdap, can get flu vaccine at local pharmacy, update shingles vaccine today.  Patient is up-to-date on mammogram, CRC screening, cervical cancer screening.  Patient was given information about preventative healthcare maintenance with disparate guidance for age range.  Patient was encouraged to start exercising      Relevant Orders   CBC   Comprehensive metabolic panel   Hemoglobin A1c   TSH   Lipid panel   Right hip pain    Ambiguous in nature.  Did offer x-ray patient politely declined.  Did tell patient use over-the-counter analgesics as needed.  Did encourage exercise inclusive of core exercises follow-up if no improvement we can always x-ray  or refer to sports medicine      Overweight    Patient is doing [place] currently continue working lifestyle modifications inclusive of exercise  Relevant Orders   CBC   Comprehensive metabolic panel   Hemoglobin A1c   TSH   Lipid panel   Other Visit Diagnoses     Screening for lung cancer       Relevant Orders   Ambulatory Referral Lung Cancer Screening [facility]   Need for shingles vaccine       Relevant Orders   Zoster Recombinant (Shingrix ) (Completed)       Return in about 1 year (around 12/06/2023).    Romilda Garret, NP

## 2022-12-05 NOTE — Assessment & Plan Note (Signed)
Intermittent comes and goes on the soft cartilage part of the left anterior nostril.  Most consistent with telangiectasia

## 2022-12-05 NOTE — Assessment & Plan Note (Signed)
Patient is doing [place] currently continue working lifestyle modifications inclusive of exercise

## 2022-12-05 NOTE — Assessment & Plan Note (Signed)
Patient smoking half pack a day for approximate the past 30 years.  Did encourage patient to stop smoking.  LDCT referral placed today

## 2023-01-01 ENCOUNTER — Encounter: Payer: Self-pay | Admitting: Gastroenterology

## 2023-01-09 ENCOUNTER — Other Ambulatory Visit: Payer: Self-pay | Admitting: Nurse Practitioner

## 2023-01-09 DIAGNOSIS — F32A Anxiety disorder, unspecified: Secondary | ICD-10-CM

## 2023-01-12 ENCOUNTER — Encounter: Payer: Self-pay | Admitting: Gastroenterology

## 2023-02-24 DIAGNOSIS — J209 Acute bronchitis, unspecified: Secondary | ICD-10-CM | POA: Diagnosis not present

## 2023-03-13 ENCOUNTER — Encounter: Payer: Self-pay | Admitting: Emergency Medicine

## 2023-03-13 ENCOUNTER — Ambulatory Visit (AMBULATORY_SURGERY_CENTER): Payer: BC Managed Care – PPO

## 2023-03-13 VITALS — Ht 63.0 in | Wt 167.0 lb

## 2023-03-13 DIAGNOSIS — Z1211 Encounter for screening for malignant neoplasm of colon: Secondary | ICD-10-CM

## 2023-03-13 MED ORDER — NA SULFATE-K SULFATE-MG SULF 17.5-3.13-1.6 GM/177ML PO SOLN: 1.0000 | Freq: Once | ORAL | 0 refills | Status: AC

## 2023-03-13 NOTE — Progress Notes (Signed)

## 2023-03-27 ENCOUNTER — Encounter: Payer: Self-pay | Admitting: Gastroenterology

## 2023-04-10 ENCOUNTER — Encounter: Payer: BC Managed Care – PPO | Admitting: Gastroenterology

## 2023-05-01 ENCOUNTER — Encounter: Payer: Self-pay | Admitting: Obstetrics & Gynecology

## 2023-05-01 ENCOUNTER — Other Ambulatory Visit (HOSPITAL_COMMUNITY)
Admission: RE | Admit: 2023-05-01 | Discharge: 2023-05-01 | Disposition: A | Discharge status: Home / Self Care | Payer: BC Managed Care – PPO | Source: Ambulatory Visit | Attending: Obstetrics & Gynecology | Admitting: Obstetrics & Gynecology

## 2023-05-01 ENCOUNTER — Ambulatory Visit (INDEPENDENT_AMBULATORY_CARE_PROVIDER_SITE_OTHER): Payer: BC Managed Care – PPO | Admitting: Obstetrics & Gynecology

## 2023-05-01 VITALS — BP 120/78 | HR 69 | Ht 62.75 in | Wt 170.0 lb

## 2023-05-01 DIAGNOSIS — F17210 Nicotine dependence, cigarettes, uncomplicated: Secondary | ICD-10-CM | POA: Diagnosis not present

## 2023-05-01 DIAGNOSIS — Z01419 Encounter for gynecological examination (general) (routine) without abnormal findings: Secondary | ICD-10-CM

## 2023-05-01 DIAGNOSIS — N958 Other specified menopausal and perimenopausal disorders: Secondary | ICD-10-CM | POA: Diagnosis not present

## 2023-05-01 DIAGNOSIS — Z9851 Tubal ligation status: Secondary | ICD-10-CM

## 2023-05-01 DIAGNOSIS — Z780 Asymptomatic menopausal state: Secondary | ICD-10-CM

## 2023-05-01 NOTE — Progress Notes (Signed)
YOLA FELTZ [birth year] [id number]   History:    52 y.o. G2P2L2 Married. S/P TL.  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   RP:  Established patient presenting for annual gyn exam    HPI:  Postmenopause, well on no HRT.  No PMB.  No pelvic pain.  No pain with IC.  Pap Neg 03/2022.  Pap reflex today. H/O LEEP in early 20's. Breasts normal.  Mammo Neg 09/2022. Lifetime risk Breast cancer 28%. Sister was 21 when dx Breast CA, Mother dx Breast Ca. Colono 01/2013, schedule this year or Cologuard with Fam MD.  BMI 30.35.  Needs to increase fitness activities.  Plans to quit smoking, smoking 1/2 pack a day now.  Health labs with Fam MD.   Past medical history,surgical history, family history and social history were all reviewed and documented in the EPIC chart.  Gynecologic History Patient's last menstrual period was 11/20/2020.  Obstetric History OB History  Gravida Para Term Preterm AB Living  2 2 2     2   SAB IAB Ectopic Multiple Live Births               # Outcome Date GA Lbr Len/2nd Weight Sex Type Anes PTL Lv  2 Term           1 Term              ROS: A ROS was performed and pertinent positives and negatives are included in the history. GENERAL: No fevers or chills. HEENT: No change in vision, no earache, sore throat or sinus congestion. NECK: No pain or stiffness. CARDIOVASCULAR: No chest pain or pressure. No palpitations. PULMONARY: No shortness of breath, cough or wheeze. GASTROINTESTINAL: No abdominal pain, nausea, vomiting or diarrhea, melena or bright red blood per rectum. GENITOURINARY: No urinary frequency, urgency, hesitancy or dysuria. MUSCULOSKELETAL: No joint or muscle pain, no back pain, no recent trauma. DERMATOLOGIC: No rash, no itching, no lesions. ENDOCRINE: No polyuria, polydipsia, no heat or cold intolerance. No recent change in weight. HEMATOLOGICAL: No anemia or easy bruising or bleeding. NEUROLOGIC: No headache, seizures, numbness, tingling or  weakness. PSYCHIATRIC: No depression, no loss of interest in normal activity or change in sleep pattern.     Exam:   BP 120/78   Pulse 69   Ht 5' 2.75" (1.594 m)   Wt 170 lb (77.1 kg)   LMP 11/20/2020 Comment: BTL, sexually active  SpO2 97%   BMI 30.35 kg/m   Body mass index is 30.35 kg/m.  General appearance : Well developed well nourished female. No acute distress HEENT: Eyes: no retinal hemorrhage or exudates,  Neck supple, trachea midline, no carotid bruits, no thyroidmegaly Lungs: Clear to auscultation, no rhonchi or wheezes, or rib retractions  Heart: Regular rate and rhythm, no murmurs or gallops Breast:Examined in sitting and supine position were symmetrical in appearance, no palpable masses or tenderness,  no skin retraction, no nipple inversion, no nipple discharge, no skin discoloration, no axillary or supraclavicular lymphadenopathy Abdomen: no palpable masses or tenderness, no rebound or guarding Extremities: no edema or skin discoloration or tenderness  Pelvic: Vulva: Normal             Vagina: No gross lesions or discharge  Cervix: No gross lesions or discharge.  Pap reflex done.  Uterus  AV, normal size, shape and consistency, non-tender and mobile  Adnexa  Without masses or tenderness  Anus: Normal   Assessment/Plan:  52 y.o. female for annual  exam   1. Encounter for routine gynecological examination with Papanicolaou smear of cervix Postmenopause, well on no HRT.  No PMB.  No pelvic pain.  No pain with IC.  Pap Neg 03/2022.  Pap reflex today. H/O LEEP in early 20's. Breasts normal.  Mammo Neg 09/2022. Lifetime risk Breast cancer 28%. Sister was 60 when dx Breast CA, Mother dx Breast Ca. Colono 01/2013, schedule this year or Cologuard with Fam MD.  BMI 30.35.  Needs to increase fitness activities.  Plans to quit smoking, smoking 1/2 pack a day now.  Health labs with Fam MD. - Cytology - PAP( [place])  2. S/P tubal ligation  3. Postmenopause Postmenopause,  well on no HRT.  No PMB.  No pelvic pain.  No pain with IC.   4. Genitourinary syndrome of menopause Counseling done.  Recommend Replens, Coconut oil or Astroglide with IC.  5. Cigarette smoker  Strongly recommend quitting.  Genia Del MD, 8:14 AM

## 2023-05-06 LAB — CYTOLOGY - PAP
Comment: NEGATIVE
Diagnosis: UNDETERMINED — AB
High risk HPV: POSITIVE — AB

## 2023-05-07 ENCOUNTER — Other Ambulatory Visit: Payer: Self-pay

## 2023-05-07 ENCOUNTER — Telehealth: Payer: Self-pay

## 2023-05-07 DIAGNOSIS — R87610 Atypical squamous cells of undetermined significance on cytologic smear of cervix (ASC-US): Secondary | ICD-10-CM

## 2023-05-07 NOTE — Telephone Encounter (Signed)
Pt LVM in triage line asking about IC with spouse now that she is aware that she has tested +HRHPV.

## 2023-05-08 NOTE — Telephone Encounter (Signed)
I agree with the information you shared with Anessa.   I will see her for her colposcopy in August.   Encounter reviewed and closed.

## 2023-05-08 NOTE — Telephone Encounter (Signed)
LVMTCB

## 2023-05-08 NOTE — Telephone Encounter (Signed)
Pt returned call.  Spoke w/ pt, advised her that she is more than welcome to communicate with her current partner that she has tested +HRHPV virus. However, it is not usually recommended by provider's that she would need to discontinue being sexually active with them.   Her partner(s) could address what kinds of symptoms they could experience from HPV with their PCP if established. But advised her that I wasn't aware of any screenings that they had available for men to find out if they have been exposed.  Pt had additional questions as far as what she could do do boost immune system or help immune system fight off virus. I advised her that if she was a smoker, quitting would do well for her in all areas of her body. Also, implementing things in her daily life to help reduce stress could help.   Pt voiced understanding and appreciation for call. Will route to provider for final review.

## 2023-05-11 NOTE — Progress Notes (Signed)
GYNECOLOGY  VISIT   HPI: 52 y.o.   Married  Caucasian  female   G2P2002 with Patient's last menstrual period was 11/20/2020.   here for   colposcopy.  Hx LEEP in her 30s.   Smoking.  Would like to quit.   Maternal great uncle had colon cancer.   Patient has not done genetic counseling and testing.   GYNECOLOGIC HISTORY: Patient's last menstrual period was 11/20/2020. Contraception:  BTL/ PMP Menopausal hormone therapy:  n/a Last mammogram:  10/10/22 Breast Density Cat B, BI-RADS CAT 1 neg Last pap smear:   05/01/23 ASCUS: HR HPV positive, 04/18/22 ASCUS: HR HPV neg        OB History     Gravida  2   Para  2   Term  2   Preterm      AB      Living  2      SAB      IAB      Ectopic      Multiple      Live Births                 Patient Active Problem List   Diagnosis Date Noted   Right hip pain 12/05/2022   Overweight 12/05/2022   Chest mass 01/03/2022   RUQ pain 11/08/2021   Hot flashes 09/27/2021   Other fatigue 09/27/2021   Preventative health care 09/27/2021   Periorbital edema 07/05/2021   De Quervain's disease (tenosynovitis) 07/05/2021   Gastritis 04/25/2020   H. pylori infection 03/02/2020   Tobacco use 06/20/2019   Anxiety and depression 06/20/2019   Decreased hearing, left 06/09/2019   Increased risk of breast cancer 09/24/2018   Skin lesion 06/19/2017   Obesity (BMI 30.0-34.9) 12/08/2014   Social anxiety disorder 09/12/2013   Major depressive disorder, recurrent (HCC) 06/27/2013   Carpal tunnel syndrome, bilateral 12/01/2011   ANXIETY STATE, UNSPECIFIED 01/02/2011   DISORDERS, OBSESSIVE-COMPULSIVE 03/26/2007    Past Medical History:  Diagnosis Date   Anxiety    Depression    H. pylori infection 2018   treated with abx   IBS (irritable bowel syndrome)    OCD (obsessive compulsive disorder)    Seasonal allergies    Social anxiety disorder 09/12/2013    Past Surgical History:  Procedure Laterality Date   DILITATION &  CURRETTAGE/HYSTROSCOPY WITH NOVASURE ABLATION  09/03/2012   Procedure: DILATATION & CURETTAGE/HYSTEROSCOPY WITH NOVASURE ABLATION;  Surgeon: Loney Laurence, MD;  Location: [facility];  Service: Gynecology;  Laterality: N/A;   LAPAROSCOPIC TUBAL LIGATION  09/03/2012   Procedure: LAPAROSCOPIC TUBAL LIGATION;  Surgeon: Loney Laurence, MD;  Location: [facility];  Service: Gynecology;  Laterality: Bilateral;  FILSHIE CLIPS   LAPAROSCOPY  10/14/2012   Procedure: LAPAROSCOPY OPERATIVE with LSO and LOA;  Surgeon: Freddrick March. Tenny Craw, MD;  Location: [facility];  Service: Gynecology;  Laterality: N/A;   LAPAROTOMY  10/14/2012   Procedure: EXPLORATORY LAPAROTOMY;  Surgeon: Freddrick March. Tenny Craw, MD;  Location: [facility];  Service: Gynecology;  Laterality: N/A;   NASAL SEPTUM SURGERY     OOPHORECTOMY  10/14/2012   Left ovary removed--hx "ruptured chocolate cyst"   TONSILLECTOMY      Current Outpatient Medications  Medication Sig Dispense Refill   propranolol (INDERAL) 10 MG tablet 1 - 2 tablets 30 minutes before social situation 60 tablet 5   sertraline (ZOLOFT) 100 MG tablet TAKE 1 TABLET (100 MG TOTAL) BY MOUTH DAILY. 90 tablet 2   No current  facility-administered medications for this visit.     ALLERGIES: Patient has no known allergies.  Family History  Problem Relation Age of Onset   Depression Mother    Fibromyalgia Mother    Breast cancer Mother 23   Rheum arthritis Mother    Alcohol abuse Father    Breast cancer Sister 32   Breast cancer Paternal Aunt 78   Diverticulitis Paternal Aunt    Breast cancer Paternal Aunt    Cancer Paternal Uncle    Heart attack Paternal Uncle    Emphysema Maternal Grandmother        Dec COPD   Breast cancer Maternal Grandmother 60       times 2   Multiple sclerosis Maternal Grandfather    Cancer Other 46       rectal cancer   Colon cancer Neg Hx    Colon polyps Neg Hx    Esophageal cancer Neg Hx    Rectal cancer Neg Hx    Stomach cancer Neg Hx     Social History    Socioeconomic History   Marital status: Married    Spouse name: Onalee Hua   Number of children: 2   Years of education: Not on file   Highest education level: Not on file  Occupational History   Occupation: DENTAL HYGIENST    Employer: [employer]  Tobacco Use   Smoking status: Every Day    Current packs/day: 0.50    Average packs/day: 0.5 packs/day for 30.0 years (15.0 ttl pk-yrs)    Types: Cigarettes   Smokeless tobacco: Never   Tobacco comments:    started smoking again 2015  Vaping Use   Vaping status: Never Used  Substance and Sexual Activity   Alcohol use: Yes    Comment: occ   Drug use: Never   Sexual activity: Yes    Partners: Male    Birth control/protection: Surgical, Post-menopausal    Comment: Tubal ligation  Other Topics Concern   Not on file  Social History Narrative   Divorced. Two children, daughter and son. Works as a Armed forces operational officer.    Social Determinants of Corporate investment banker Strain: Not on file  Food Insecurity: Not on file  Transportation Needs: Not on file  Physical Activity: Not on file  Stress: Not on file  Social Connections: Not on file  Intimate Partner Violence: Not on file    Review of Systems  All other systems reviewed and are negative.   PHYSICAL EXAMINATION:    BP 118/82 (BP Location: Right Arm, Patient Position: Sitting, Cuff Size: Normal)   Pulse (!) 53   Ht 5' 2.75" (1.594 m)   Wt 170 lb (77.1 kg)   LMP 11/20/2020 Comment: BTL, sexually active  SpO2 (!) 88%   BMI 30.35 kg/m     General appearance: alert, cooperative and appears stated age  Colposcopy - cervix, vagina, and vulva.  Consent for procedure.  3% acetic acid used in vagina and on vulva. White light and green light filter used.  Colposcopy satisfactory:  Yes   __x___          No    _____ Findings:    Cervix:  no lesions Vagina:  no lesions Vulva:  no lesions Biopsies:   ECC Monsel's placed.  Minimal EBL. No complications.   Chaperone was  present for exam:  Warren Lacy, CMA  ASSESSMENT  ASCUS, positive HR HPV Hx LEEP in her 43s.  Smoker.  FH breast and other cancers.  Menopausal symptoms.   PLAN  Paps, HPV, colposcopy and LEEP discussed.  FU ECC.  Smoking cessation discussed.  Referral for genetic counseling and potential testing.  Reviewed SSRI/SNRI, Gabapentin, Veozah, and herbal tx for hot flashes.  Anticipate pap and HR HPV in 1 year.   10 min  total time was spent for this patient encounter, including preparation, face-to-face counseling with the patient, coordination of care, and documentation of the encounter regarding menopausal symptoms and FH breast cancer.

## 2023-05-25 ENCOUNTER — Encounter: Payer: Self-pay | Admitting: Obstetrics and Gynecology

## 2023-05-25 ENCOUNTER — Other Ambulatory Visit (HOSPITAL_COMMUNITY)
Admission: RE | Admit: 2023-05-25 | Discharge: 2023-05-25 | Disposition: A | Discharge status: Home / Self Care | Payer: BC Managed Care – PPO | Source: Ambulatory Visit | Attending: Obstetrics and Gynecology | Admitting: Obstetrics and Gynecology

## 2023-05-25 ENCOUNTER — Ambulatory Visit (INDEPENDENT_AMBULATORY_CARE_PROVIDER_SITE_OTHER): Payer: BC Managed Care – PPO | Admitting: Obstetrics and Gynecology

## 2023-05-25 VITALS — BP 118/82 | HR 53 | Ht 62.75 in | Wt 170.0 lb

## 2023-05-25 DIAGNOSIS — R87610 Atypical squamous cells of undetermined significance on cytologic smear of cervix (ASC-US): Secondary | ICD-10-CM | POA: Diagnosis not present

## 2023-05-25 DIAGNOSIS — Z803 Family history of malignant neoplasm of breast: Secondary | ICD-10-CM | POA: Diagnosis not present

## 2023-05-25 DIAGNOSIS — R87810 Cervical high risk human papillomavirus (HPV) DNA test positive: Secondary | ICD-10-CM | POA: Diagnosis not present

## 2023-05-25 DIAGNOSIS — Z716 Tobacco abuse counseling: Secondary | ICD-10-CM

## 2023-05-25 DIAGNOSIS — N951 Menopausal and female climacteric states: Secondary | ICD-10-CM

## 2023-05-25 NOTE — Patient Instructions (Addendum)
Managing Hot Flashes During Menopause You will learn what hot flashes/night sweats are, what causes them and what the treatment methods are. To view the content, go to this web address: [email or web address]  This video will expire on: 04/12/2025. If you need access to this video following this date, please reach out to the healthcare provider who assigned it to you. This information is not intended to replace advice given to you by your health care provider. Make sure you discuss any questions you have with your health care provider. Elsevier Patient Education  2024 Elsevier Inc.   Colposcopy, Care After  The following information offers guidance on how to care for yourself after your procedure. Your health care provider may also give you more specific instructions. If you have problems or questions, contact your health care provider. What can I expect after the procedure? If you had a colposcopy without a biopsy, you can expect to feel fine right away after your procedure. However, you may have some spotting of blood for a few days. You can return to your normal activities. If you had a colposcopy with a biopsy, it is common after the procedure to have: Soreness and mild pain. These may last for a few days. Mild vaginal bleeding or discharge that is dark-colored and grainy. This may last for a few days. The discharge may be caused by a liquid (solution) that was used during the procedure. You may need to wear a sanitary pad during this time. Spotting of blood for at least 48 hours after the procedure. Follow these instructions at home: Medicines Take over-the-counter and prescription medicines only as told by your health care provider. Talk with your health care provider about what type of over-the-counter pain medicines and prescription medicines you can start to take again. It is especially important to talk with your health care provider if you take blood  thinners. Activity Avoid using douche products, using tampons, and having sex for at least 3 days after the procedure or for as long as told by your health care provider. Return to your normal activities as told by your health care provider. Ask your health care provider what activities are safe for you. General instructions Ask your health care provider if you may take baths, swim, or use a hot tub. You may take showers. If you use birth control (contraception), continue to use it. Keep all follow-up visits. This is important. Contact a health care provider if: You have a fever or chills. You faint or feel light-headed. Get help right away if: You have heavy bleeding from your vagina or pass blood clots. Heavy bleeding is bleeding that soaks through a sanitary pad in less than 1 hour. You have vaginal discharge that is abnormal, is yellow in color, or smells bad. This could be a sign of infection. You have severe pain or cramps in your lower abdomen that do not go away with medicine. Summary If you had a colposcopy without a biopsy, you can expect to feel fine right away, but you may have some spotting of blood for a few days. You can return to your normal activities. If you had a colposcopy with a biopsy, it is common to have mild pain for a few days and spotting for 48 hours after the procedure. Avoid using douche products, using tampons, and having sex for at least 3 days after the procedure or for as long as told by your health care provider. Get help right away if you have heavy bleeding,  severe pain, or signs of infection. This information is not intended to replace advice given to you by your health care provider. Make sure you discuss any questions you have with your health care provider. Document Revised: 03/03/2021 Document Reviewed: 03/03/2021 Elsevier Patient Education  2024 [employer].

## 2023-06-03 ENCOUNTER — Telehealth: Payer: Self-pay | Admitting: Genetic Counselor

## 2023-07-24 ENCOUNTER — Other Ambulatory Visit: Payer: Self-pay | Admitting: Nurse Practitioner

## 2023-07-24 DIAGNOSIS — Z1212 Encounter for screening for malignant neoplasm of rectum: Secondary | ICD-10-CM

## 2023-07-24 DIAGNOSIS — Z1211 Encounter for screening for malignant neoplasm of colon: Secondary | ICD-10-CM

## 2023-08-04 ENCOUNTER — Encounter: Payer: BC Managed Care – PPO | Admitting: Genetic Counselor

## 2023-08-04 ENCOUNTER — Other Ambulatory Visit: Payer: BC Managed Care – PPO

## 2023-08-11 DIAGNOSIS — Z1211 Encounter for screening for malignant neoplasm of colon: Secondary | ICD-10-CM | POA: Diagnosis not present

## 2023-08-11 DIAGNOSIS — Z1212 Encounter for screening for malignant neoplasm of rectum: Secondary | ICD-10-CM | POA: Diagnosis not present

## 2023-08-14 ENCOUNTER — Ambulatory Visit (INDEPENDENT_AMBULATORY_CARE_PROVIDER_SITE_OTHER): Payer: BC Managed Care – PPO

## 2023-08-14 DIAGNOSIS — Z23 Encounter for immunization: Secondary | ICD-10-CM

## 2023-08-14 NOTE — Progress Notes (Signed)
Per orders of Mordecai Maes, NP , injection of Shingles  given by Wilburn Cornelia in right deltoid. Patient tolerated injection well.

## 2023-08-16 LAB — COLOGUARD: COLOGUARD: NEGATIVE

## 2023-09-04 ENCOUNTER — Telehealth: Payer: Self-pay | Admitting: Nurse Practitioner

## 2023-09-04 DIAGNOSIS — F32A Depression, unspecified: Secondary | ICD-10-CM

## 2023-09-04 MED ORDER — SERTRALINE HCL 100 MG PO TABS: 100.0000 mg | ORAL_TABLET | Freq: Every day | ORAL | 0 refills | Status: DC

## 2023-09-04 NOTE — Telephone Encounter (Signed)
Prescription Request  09/04/2023  LOV: 12/05/2022  What is the name of the medication or equipment? sertraline (ZOLOFT) 100 MG tablet   Have you contacted your pharmacy to request a refill? Yes   Which pharmacy would you like this sent to?  [facility] - [place], [place] - [address] [place] [address] Phone: [phone] Fax: [phone]    Patient notified that their request is being sent to the clinical staff for review and that they should receive a response within 2 business days.   Please advise at Mobile [phone] (mobile)  Pt mentioned she'll be out of the country from 10/09/23 to 10/29/23. Pt states she'll run out of meds 12/22 & needs refill before leaving.

## 2023-09-04 NOTE — Telephone Encounter (Signed)
LAST APPOINTMENT DATE: 12/05/22   NEXT APPOINTMENT DATE: Visit date not found  Sertraline 100 mg   LAST REFILL: 01/09/2023  QTY: #90 2RF   Does she need to be scheduled for office visit?   Pt mentioned she'll be out of the country from 10/09/23 to 10/29/23. Pt states she'll run out of meds 12/22 & needs refill before leaving.

## 2023-09-04 NOTE — Telephone Encounter (Signed)
Refill sent to the pharmacy 

## 2023-09-10 ENCOUNTER — Inpatient Hospital Stay: Payer: Managed Care, Other (non HMO)

## 2023-09-10 ENCOUNTER — Telehealth: Payer: Self-pay | Admitting: Genetic Counselor

## 2023-09-10 ENCOUNTER — Inpatient Hospital Stay: Payer: Managed Care, Other (non HMO) | Admitting: Genetic Counselor

## 2023-09-10 NOTE — Telephone Encounter (Signed)
Amy Marks called to cancel her genetic counseling appointment. She has a high deductible and has not yet heard back from [facility] about the expected cost for her appointment. She said she will contact us to rescheduled when she learns more about the cost/if she can afford it. I provided her with our [employer] number Amy Marks) and the genetic counseling CPT code.  Lalla Brothers, MS, [facility] Genetic Counselor [email or web address] (P) [phone]

## 2023-12-18 ENCOUNTER — Ambulatory Visit: Payer: Managed Care, Other (non HMO) | Admitting: Nurse Practitioner

## 2023-12-18 ENCOUNTER — Encounter: Payer: Self-pay | Admitting: Nurse Practitioner

## 2023-12-18 VITALS — BP 118/72 | HR 72 | Temp 97.6°F | Ht 62.75 in | Wt 185.2 lb

## 2023-12-18 DIAGNOSIS — E66811 Obesity, class 1: Secondary | ICD-10-CM

## 2023-12-18 DIAGNOSIS — J3489 Other specified disorders of nose and nasal sinuses: Secondary | ICD-10-CM | POA: Diagnosis not present

## 2023-12-18 DIAGNOSIS — Z13220 Encounter for screening for lipoid disorders: Secondary | ICD-10-CM

## 2023-12-18 DIAGNOSIS — Z87891 Personal history of nicotine dependence: Secondary | ICD-10-CM

## 2023-12-18 DIAGNOSIS — Z0000 Encounter for general adult medical examination without abnormal findings: Secondary | ICD-10-CM

## 2023-12-18 DIAGNOSIS — H6993 Unspecified Eustachian tube disorder, bilateral: Secondary | ICD-10-CM

## 2023-12-18 DIAGNOSIS — Z1231 Encounter for screening mammogram for malignant neoplasm of breast: Secondary | ICD-10-CM

## 2023-12-18 DIAGNOSIS — J029 Acute pharyngitis, unspecified: Secondary | ICD-10-CM | POA: Insufficient documentation

## 2023-12-18 DIAGNOSIS — F330 Major depressive disorder, recurrent, mild: Secondary | ICD-10-CM | POA: Diagnosis not present

## 2023-12-18 DIAGNOSIS — R52 Pain, unspecified: Secondary | ICD-10-CM | POA: Diagnosis not present

## 2023-12-18 DIAGNOSIS — Z131 Encounter for screening for diabetes mellitus: Secondary | ICD-10-CM

## 2023-12-18 DIAGNOSIS — Z122 Encounter for screening for malignant neoplasm of respiratory organs: Secondary | ICD-10-CM

## 2023-12-18 DIAGNOSIS — F411 Generalized anxiety disorder: Secondary | ICD-10-CM

## 2023-12-18 LAB — POC COVID19 BINAXNOW: SARS Coronavirus 2 Ag: NEGATIVE

## 2023-12-18 LAB — POCT RAPID STREP A (OFFICE): Rapid Strep A Screen: NEGATIVE

## 2023-12-18 LAB — POCT FLU A/B STATUS
Influenza A, POC: NEGATIVE
Influenza B, POC: NEGATIVE

## 2023-12-18 MED ORDER — FLUTICASONE PROPIONATE 50 MCG/ACT NA SUSP: 2.0000 | Freq: Every day | NASAL | 0 refills | Status: AC

## 2023-12-18 MED ORDER — SERTRALINE HCL 50 MG PO TABS: 50.0000 mg | ORAL_TABLET | Freq: Every day | ORAL | 3 refills | Status: DC

## 2023-12-18 NOTE — Assessment & Plan Note (Signed)
 History of the same.  States life has settled down.  She was maintained on sertraline 100 mg daily.  She would like to titrate down we will start sertraline 50 mg daily.  Patient denies HI/SI/AVH.

## 2023-12-18 NOTE — Assessment & Plan Note (Signed)
 Continue working on healthy lifestyle modifications.  Pending TSH, lipid panel, A1c.

## 2023-12-18 NOTE — Assessment & Plan Note (Signed)
 Flonase nasal spray 50 mcg per actuation 2 sprays each nostril daily.  If no relief consider doing a prednisone prescription

## 2023-12-18 NOTE — Progress Notes (Signed)
 Established Patient Office Visit  Subjective   Patient ID: Amy Marks, female    DOB: 1971/06/02  Age: 53 y.o. MRN: [id number]  Chief Complaint  Patient presents with   Medication Refill    Sertaline. And pt wants to discuss a decrease in dosage.    Sore Throat    Pt complains of sore throat that started Monday. Went to UC on Tuesday. Strep test is negative. Pt states of no body aches, fever, cough, or diarrhea. Pt complains of ear pain/pressure.       for complete physical and follow up of chronic conditions.   Anxiety/depression: currenlty maintained on sertraline 100mg  daily. States that she is doing well. States that when she went on thew 100 mg she was going through a divorce. States that stuff has settled down.    Immunizations: -Tetanus: Completed in 2017 -Influenza: 08/14/2023 -Shingles: Completed Shingrix series -Pneumonia: too young   Diet: Fair diet.  Colonoscopy: Completed in 02/14/2013. Cologuard 08/11/2023 negative  Lung Cancer Screening: Ambulatory referral to lung cancer screening patient has stopped smoking traditional cigarettes currently vaping  Pap smear: 05/01/2023 ASCUS and HPV positive.  Patient does have history of colposcopy and followed by GYN  Mammogram: 10/10/2022. Due at [facility]  Dexa: Too young    Sick symptoms: states that it started Monday morning while at work. She had a really bad sore throat. Hurt to talk swallow or eat. States Tuesday she wnet to UC and did a rapid strep that was negative. Culture is pending. She has called to check on it States that the sore thorat has improved. States that she feels like there is a lump in hter throat. She has a lot of pressure int he ears and sinuses.  No sick contact that she knows of . She went to the beach and was around her son who is not sick.  She has tired ibuprofen, mucinex, allergy pill. She is having short term relief       Review of Systems  Constitutional:   Positive for malaise/fatigue. Negative for chills and fever.  HENT:  Positive for ear pain and sinus pain. Negative for ear discharge and sore throat.   Eyes:  Positive for pain.  Respiratory:  Negative for cough and shortness of breath.   Cardiovascular:  Negative for chest pain.  Gastrointestinal:  Negative for abdominal pain, constipation, diarrhea, nausea and vomiting.       BM daily   Musculoskeletal:  Positive for myalgias.  Neurological:  Positive for headaches. Negative for dizziness (light headedness).  Psychiatric/Behavioral:  Negative for hallucinations and suicidal ideas.       Objective:     BP 118/72   Pulse 72   Temp 97.6 F (36.4 C) (Oral)   Ht 5' 2.75" (1.594 m)   Wt 185 lb 3.2 oz (84 kg)   LMP 11/20/2020 Comment: BTL, sexually active  SpO2 100%   BMI 33.07 kg/m  BP Readings from Last 3 Encounters:  12/18/23 118/72  05/25/23 118/82  05/01/23 120/78   Wt Readings from Last 3 Encounters:  12/18/23 185 lb 3.2 oz (84 kg)  05/25/23 170 lb (77.1 kg)  05/01/23 170 lb (77.1 kg)   SpO2 Readings from Last 3 Encounters:  12/18/23 100%  05/25/23 (!) 88%  05/01/23 97%      Physical Exam Vitals and nursing note reviewed.  Constitutional:      Appearance: Normal appearance.  HENT:     Right Ear: Tympanic membrane,  ear canal and external ear normal.     Left Ear: Tympanic membrane, ear canal and external ear normal.     Ears:     Comments: Clear fluid behind bilateral TM R>L    Mouth/Throat:     Mouth: Mucous membranes are moist.     Pharynx: Oropharynx is clear. Posterior oropharyngeal erythema present.  Eyes:     Extraocular Movements: Extraocular movements intact.     Pupils: Pupils are equal, round, and reactive to light.  Cardiovascular:     Rate and Rhythm: Normal rate and regular rhythm.     Pulses: Normal pulses.     Heart sounds: Normal heart sounds.  Pulmonary:     Effort: Pulmonary effort is normal.     Breath sounds: Normal breath sounds.   Abdominal:     General: Bowel sounds are normal. There is no distension.     Palpations: There is no mass.     Tenderness: There is no abdominal tenderness.     Hernia: No hernia is present.  Musculoskeletal:     Right lower leg: No edema.     Left lower leg: No edema.  Lymphadenopathy:     Cervical: Cervical adenopathy present.  Skin:    General: Skin is warm.  Neurological:     General: No focal deficit present.     Mental Status: She is alert.     Deep Tendon Reflexes:     Reflex Scores:      Bicep reflexes are 2+ on the right side and 2+ on the left side.      Patellar reflexes are 2+ on the right side and 2+ on the left side.    Comments: Bilateral upper and lower extremity strength 5/5  Psychiatric:        Mood and Affect: Mood normal.        Behavior: Behavior normal.        Thought Content: Thought content normal.        Judgment: Judgment normal.      Results for orders placed or performed in visit on 12/18/23  Rapid Strep A  Result Value Ref Range   Rapid Strep A Screen Negative Negative  POC COVID-19  Result Value Ref Range   SARS Coronavirus 2 Ag Negative Negative  POCT Flu A & B Status  Result Value Ref Range   Influenza A, POC Negative Negative   Influenza B, POC Negative Negative      The 10-year ASCVD risk score (Arnett DK, et al., 2019) is: 0.9%    Assessment & Plan:   Problem List Items Addressed This Visit       Respiratory   Viral pharyngitis   Flu, COVID, strep test negative in office.  Patient has had some modest improvement.  Continues an over-the-counter analgesics as needed drink plenty of fluid and use Flonase as directed if not improving by Monday consider writing patient azithromycin 250 mg pack        Nervous and Auditory   Dysfunction of both eustachian tubes   Flonase nasal spray 50 mcg per actuation 2 sprays each nostril daily.  If no relief consider doing a prednisone prescription      Relevant Medications    fluticasone (FLONASE) 50 MCG/ACT nasal spray     Other   Anxiety state   History of the same.  States life has settled down.  She was maintained on sertraline 100 mg daily.  She would like to titrate down we  will start sertraline 50 mg daily.  Patient denies HI/SI/AVH.      Relevant Medications   sertraline (ZOLOFT) 50 MG tablet   Other Relevant Orders   CBC   Comprehensive metabolic panel   TSH   Major depressive disorder, recurrent (HCC)   History of the same.  Wife has settled down per patient report.  Will titrate sertraline from 100 mg daily to sertraline 50 mg daily.  Patient denies HI/SI/AVH.      Relevant Medications   sertraline (ZOLOFT) 50 MG tablet   Obesity (BMI 30.0-34.9)   Continue working on healthy lifestyle modifications.  Pending TSH, lipid panel, A1c      Relevant Orders   TSH   Former tobacco use   Urine microscopy to rule out microscopic hematuria      Relevant Orders   Urine Microscopic   CBC   Preventative health care - Primary   Discussed age-appropriate immunizations and screening exams.  Did review patient's personal, surgical, social, family histories.  Patient is up-to-date on all age-appropriate vaccinations she would like.  Patient is up-to-date on CRC screening with Cologuard.  Patient is following with GYN for cervical cancer screening.  Patient is overdue for mammogram order placed today and patient given information to call and set up imaging.  Patient was given information at discharge about preventative healthcare maintenance with anticipatory guidance      Relevant Orders   CBC   Comprehensive metabolic panel   TSH   Sore throat   Strep test in office.  Can using over-the-counter analgesics as needed, Flonase, drink plenty of fluids.      Relevant Orders   Rapid Strep A (Completed)   POC COVID-19 (Completed)   POCT Flu A & B Status (Completed)   Sinus pressure   Flonase nasal spray as directed.  If ineffective consider prednisone       Body aches   Flu and COVID test in office.  Patient to continue using over-the-counter analgesics as needed      Relevant Orders   POC COVID-19 (Completed)   POCT Flu A & B Status (Completed)   Other Visit Diagnoses       Screening for lipid disorders       Relevant Orders   Lipid panel     Screening for diabetes mellitus       Relevant Orders   Hemoglobin A1c     Screening mammogram for breast cancer       Relevant Orders   MM 3D SCREENING MAMMOGRAM BILATERAL BREAST     Screening for lung cancer       Relevant Orders   Ambulatory Referral Lung Cancer Screening [facility]       Return in about 1 year (around 12/17/2024) for CPE and Labs.    Audria Nine, NP

## 2023-12-18 NOTE — Patient Instructions (Addendum)
 Nice to see you today Your flu, covid, and strep test was negative I will be in touch with the labs once I have them I did decrease the sertraline Follow up with me in 1 year, sooner if you need me  Call and scheduled your mammogram  [facility] [address] [place] [address]  [phone]

## 2023-12-18 NOTE — Assessment & Plan Note (Signed)
 Strep test in office.  Can using over-the-counter analgesics as needed, Flonase, drink plenty of fluids.

## 2023-12-18 NOTE — Assessment & Plan Note (Signed)
 Flu and COVID test in office.  Patient to continue using over-the-counter analgesics as needed

## 2023-12-18 NOTE — Assessment & Plan Note (Signed)
 Flu, COVID, strep test negative in office.  Patient has had some modest improvement.  Continues an over-the-counter analgesics as needed drink plenty of fluid and use Flonase as directed if not improving by Monday consider writing patient azithromycin 250 mg pack

## 2023-12-18 NOTE — Assessment & Plan Note (Signed)
 Discussed age-appropriate immunizations and screening exams.  Did review patient's personal, surgical, social, family histories.  Patient is up-to-date on all age-appropriate vaccinations she would like.  Patient is up-to-date on CRC screening with Cologuard.  Patient is following with GYN for cervical cancer screening.  Patient is overdue for mammogram order placed today and patient given information to call and set up imaging.  Patient was given information at discharge about preventative healthcare maintenance with anticipatory guidance

## 2023-12-18 NOTE — Assessment & Plan Note (Signed)
 Urine microscopy to rule out microscopic hematuria

## 2023-12-18 NOTE — Assessment & Plan Note (Signed)
 Flonase nasal spray as directed.  If ineffective consider prednisone

## 2023-12-18 NOTE — Assessment & Plan Note (Signed)
 History of the same.  Wife has settled down per patient report.  Will titrate sertraline from 100 mg daily to sertraline 50 mg daily.  Patient denies HI/SI/AVH.

## 2023-12-19 ENCOUNTER — Telehealth: Admitting: Family Medicine

## 2023-12-19 DIAGNOSIS — J069 Acute upper respiratory infection, unspecified: Secondary | ICD-10-CM | POA: Diagnosis not present

## 2023-12-19 MED ORDER — PREDNISONE 20 MG PO TABS: 20.0000 mg | ORAL_TABLET | Freq: Two times a day (BID) | ORAL | 0 refills | Status: AC

## 2023-12-19 NOTE — Progress Notes (Signed)
 E visit for Allergic Rhinitis We are sorry that you are not feeling well.  Here is how we plan to help!  Based on what you have shared with me it looks like you have Allergic Rhinitis.  Rhinitis is when a reaction occurs that causes nasal congestion, runny nose, sneezing, and itching.  Most types of rhinitis are caused by an inflammation and are associated with symptoms in the eyes ears or throat. There are several types of rhinitis.  The most common are acute rhinitis, which is usually caused by a viral illness, allergic or seasonal rhinitis, and nonallergic or year-round rhinitis.  Nasal allergies occur certain times of the year.  Allergic rhinitis is caused when allergens in the air trigger the release of histamine in the body.  Histamine causes itching, swelling, and fluid to build up in the fragile linings of the nasal passages, sinuses and eyelids.  An itchy nose and clear discharge are common.  I recommend the following over the counter treatments: You should take a daily dose of antihistamine  I also would recommend a nasal spray: Flonase 2 sprays into each nostril once daily  I have also sent prednisone  HOME CARE:  You can use an over-the-counter saline nasal spray as needed Avoid areas where there is heavy dust, mites, or molds Stay indoors on windy days during the pollen season Keep windows closed in home, at least in bedroom; use air conditioner. Use high-efficiency house air filter Keep windows closed in car, turn AC on re-circulate Avoid playing out with dog during pollen season  GET HELP RIGHT AWAY IF:  If your symptoms do not improve within 10 days You become short of breath You develop yellow or green discharge from your nose for over 3 days You have coughing fits  MAKE SURE YOU:  Understand these instructions Will watch your condition Will get help right away if you are not doing well or get worse  Thank you for choosing an e-visit. Your e-visit answers were  reviewed by a board certified advanced clinical practitioner to complete your personal care plan. Depending upon the condition, your plan could have included both over the counter or prescription medications. Please review your pharmacy choice. Be sure that the pharmacy you have chosen is open so that you can pick up your prescription now.  If there is a problem you may message your provider in MyChart to have the prescription routed to another pharmacy. Your safety is important to [place]. If you have drug allergies check your prescription carefully.  For the next 24 hours, you can use MyChart to ask questions about today's visit, request a non-urgent call back, or ask for a work or school excuse from your e-visit provider. You will get an email in the next two days asking about your experience. I hope that your e-visit has been valuable and will speed your recovery.   have provided 5 minutes of non face to face time during this encounter for chart review and documentation.

## 2023-12-20 LAB — COMPREHENSIVE METABOLIC PANEL
AG Ratio: 1.6 (calc) (ref 1.0–2.5)
ALT: 27 U/L (ref 6–29)
AST: 23 U/L (ref 10–35)
Albumin: 4.1 g/dL (ref 3.6–5.1)
Alkaline phosphatase (APISO): 71 U/L (ref 37–153)
BUN: 12 mg/dL (ref 7–25)
CO2: 27 mmol/L (ref 20–32)
Calcium: 9.4 mg/dL (ref 8.6–10.4)
Chloride: 105 mmol/L (ref 98–110)
Creat: 0.77 mg/dL (ref 0.50–1.03)
Globulin: 2.5 g/dL (ref 1.9–3.7)
Glucose, Bld: 93 mg/dL (ref 65–99)
Potassium: 5.3 mmol/L (ref 3.5–5.3)
Sodium: 142 mmol/L (ref 135–146)
Total Bilirubin: 0.2 mg/dL (ref 0.2–1.2)
Total Protein: 6.6 g/dL (ref 6.1–8.1)

## 2023-12-20 LAB — CBC
HCT: 39.1 % (ref 35.0–45.0)
Hemoglobin: 12.9 g/dL (ref 11.7–15.5)
MCH: 30.4 pg (ref 27.0–33.0)
MCHC: 33 g/dL (ref 32.0–36.0)
MCV: 92 fL (ref 80.0–100.0)
MPV: 10.4 fL (ref 7.5–12.5)
Platelets: 200 10*3/uL (ref 140–400)
RBC: 4.25 10*6/uL (ref 3.80–5.10)
RDW: 11.6 % (ref 11.0–15.0)
WBC: 6.6 10*3/uL (ref 3.8–10.8)

## 2023-12-20 LAB — HEMOGLOBIN A1C
Hgb A1c MFr Bld: 5.3 %{Hb} (ref <5.7)
Mean Plasma Glucose: 105 mg/dL
eAG (mmol/L): 5.8 mmol/L

## 2023-12-20 LAB — URINALYSIS, MICROSCOPIC ONLY
Hyaline Cast: NONE SEEN /LPF
RBC / HPF: NONE SEEN /HPF (ref 0–2)
WBC, UA: NONE SEEN /HPF (ref 0–5)

## 2023-12-20 LAB — LIPID PANEL
Cholesterol: 207 mg/dL — ABNORMAL HIGH (ref <200)
HDL: 70 mg/dL (ref >=50)
LDL Cholesterol (Calc): 115 mg/dL — ABNORMAL HIGH
Non-HDL Cholesterol (Calc): 137 mg/dL — ABNORMAL HIGH (ref <130)
Total CHOL/HDL Ratio: 3 (calc) (ref <5.0)
Triglycerides: 112 mg/dL (ref <150)

## 2023-12-20 LAB — EXTRA URINE SPECIMEN

## 2023-12-20 LAB — TSH: TSH: 1.84 m[IU]/L

## 2023-12-21 ENCOUNTER — Encounter: Payer: Self-pay | Admitting: Nurse Practitioner

## 2023-12-22 ENCOUNTER — Other Ambulatory Visit: Payer: Self-pay | Admitting: Nurse Practitioner

## 2024-01-01 ENCOUNTER — Ambulatory Visit
Admission: RE | Admit: 2024-01-01 | Discharge: 2024-01-01 | Disposition: A | Discharge status: Home / Self Care | Payer: Managed Care, Other (non HMO) | Source: Ambulatory Visit | Attending: Nurse Practitioner | Admitting: Nurse Practitioner

## 2024-01-01 DIAGNOSIS — Z1231 Encounter for screening mammogram for malignant neoplasm of breast: Secondary | ICD-10-CM

## 2024-01-05 ENCOUNTER — Encounter: Payer: Self-pay | Admitting: Nurse Practitioner

## 2024-01-15 ENCOUNTER — Other Ambulatory Visit: Payer: Self-pay | Admitting: Nurse Practitioner

## 2024-01-15 DIAGNOSIS — H6993 Unspecified Eustachian tube disorder, bilateral: Secondary | ICD-10-CM

## 2024-05-02 ENCOUNTER — Other Ambulatory Visit: Payer: Self-pay | Admitting: Nurse Practitioner

## 2024-07-22 ENCOUNTER — Ambulatory Visit: Admitting: Nurse Practitioner

## 2024-07-28 ENCOUNTER — Encounter: Payer: Self-pay | Admitting: Nurse Practitioner

## 2024-07-28 MED ORDER — SERTRALINE HCL 100 MG PO TABS: 100.0000 mg | ORAL_TABLET | Freq: Every day | ORAL | 1 refills | Status: AC

## 2024-09-07 ENCOUNTER — Ambulatory Visit: Admitting: Nurse Practitioner

## 2024-09-08 NOTE — Progress Notes (Signed)
 Amy Marks February 03, 1971 [id number]   History:  53 y.o. G2P2002 presents for annual exam. Postmenopausal - no HRT, no bleeding. Complains of vaginal dryness. Has tried lubricant. 04/2023 ASCUS + HR HPV, benign biopsy. LEEP in her 28s. Smoker, 1/2 ppd. IBIS lifetime breast cancer risk of 24.3%. Sister diagnosed with breast cancer at age 107 (neg genetic testing), mother and MGM diagnosed at age 57, two paternal aunts in their 43s.   Gynecologic History Patient's last menstrual period was 11/20/2020.   Contraception/Family planning: post menopausal status Sexually active: Yes  Health Maintenance Last Pap: 05/01/2023. Results were: ASCUS + HR HPV Last mammogram: 01/01/2024. Results were: Normal Last colonoscopy: 02/14/2013. Neg Cologuard 08/11/2023 Last Dexa: Not indicated     09/09/2024    7:29 AM  Depression screen PHQ 2/9  Decreased Interest 1  Down, Depressed, Hopeless 0  PHQ - 2 Score 1     Past medical history, past surgical history, family history and social history were all reviewed and documented in the EPIC chart. Married. Daughter - In [place], husband in marine, has 2 children. Son.   ROS:  A ROS was performed and pertinent positives and negatives are included.  Exam:  Vitals:   09/09/24 0729  BP: 108/70  Pulse: 84  SpO2: 98%  Weight: 190 lb (86.2 kg)  Height: 5' 2.75 (1.594 m)   Body mass index is 33.93 kg/m.  General appearance:  Normal Thyroid :  Symmetrical, normal in size, without palpable masses or nodularity. Respiratory  Auscultation:  Clear without wheezing or rhonchi Cardiovascular  Auscultation:  Regular rate, without rubs, murmurs or gallops  Edema/varicosities:  Not grossly evident Abdominal  Soft,nontender, without masses, guarding or rebound.  Liver/spleen:  No organomegaly noted  Hernia:  None appreciated  Skin  Inspection:  Grossly normal Breasts: Examined lying and sitting.   Right: Without masses, retractions, nipple discharge or  axillary adenopathy.   Left: Without masses, retractions, nipple discharge or axillary adenopathy. Pelvic: External genitalia:  no lesions              Urethra:  normal appearing urethra with no masses, tenderness or lesions              Bartholins and Skenes: normal                 Vagina: normal appearing vagina with normal color and discharge, no lesions              Cervix: no lesions Bimanual Exam:  Uterus:  no masses or tenderness              Adnexa: no mass, fullness, tenderness              Rectovaginal: Deferred              Anus:  normal, no lesions  Amy Marks, CMA present as chaperone.   Assessment/Plan:  53 y.o. [id number] for annual exam.   Well female exam with routine gynecological exam - Plan: Cytology - PAP( [place]). Education provided on SBEs, importance of preventative screenings, current guidelines, high calcium diet, regular exercise, and multivitamin daily. Labs with PCP.   Postmenopausal - no HRT, no bleeding.   Depression screening - PHQ - 1  Cervical cancer screening - Plan: Cytology - PAP( [place]). 04/2023 ASCUS + HR HPV, benign biopsy. LEEP in her 48s.  Pap today per guidelines.   At high risk for breast cancer - Plan: Ambulatory referral to Genetics. IBIS lifetime  breast cancer risk of 24.3%. Sister diagnosed with breast cancer at age 74 (neg genetic testing), mother and MGM diagnosed at age 61, two paternal aunts in their 46s. Recommend annual MRI in conjunction with mammogram. UTD on mammogram. Normal breast exam today.  Dyspareunia in female - Plan: estradiol  (ESTRACE ) 0.01 % CREA vaginal cream twice weekly.   Menopausal vaginal dryness - Plan: estradiol  (ESTRACE ) 0.01 % CREA vaginal cream twice weekly.   Screening for colon cancer - 2014 colonoscopy. Neg Cologuard 07/2023. Average risk.   Screening for osteoporosis - Average risk. Will plan DXA at age 74.   Return in about 1 year (around 09/09/2025) for Annual.     Amy DELENA Shutter DNP,  7:43 AM 09/09/2024

## 2024-09-09 ENCOUNTER — Ambulatory Visit: Payer: Self-pay | Admitting: Nurse Practitioner

## 2024-09-09 ENCOUNTER — Other Ambulatory Visit (HOSPITAL_COMMUNITY)
Admission: RE | Admit: 2024-09-09 | Discharge: 2024-09-09 | Disposition: A | Source: Ambulatory Visit | Attending: Nurse Practitioner | Admitting: Nurse Practitioner

## 2024-09-09 ENCOUNTER — Telehealth: Payer: Self-pay | Admitting: *Deleted

## 2024-09-09 ENCOUNTER — Encounter: Payer: Self-pay | Admitting: Nurse Practitioner

## 2024-09-09 VITALS — BP 108/70 | HR 84 | Ht 62.75 in | Wt 190.0 lb

## 2024-09-09 DIAGNOSIS — Z1331 Encounter for screening for depression: Secondary | ICD-10-CM | POA: Diagnosis not present

## 2024-09-09 DIAGNOSIS — Z780 Asymptomatic menopausal state: Secondary | ICD-10-CM

## 2024-09-09 DIAGNOSIS — N941 Unspecified dyspareunia: Secondary | ICD-10-CM

## 2024-09-09 DIAGNOSIS — Z9189 Other specified personal risk factors, not elsewhere classified: Secondary | ICD-10-CM | POA: Diagnosis not present

## 2024-09-09 DIAGNOSIS — Z01419 Encounter for gynecological examination (general) (routine) without abnormal findings: Secondary | ICD-10-CM | POA: Diagnosis not present

## 2024-09-09 DIAGNOSIS — N951 Menopausal and female climacteric states: Secondary | ICD-10-CM | POA: Diagnosis not present

## 2024-09-09 DIAGNOSIS — Z124 Encounter for screening for malignant neoplasm of cervix: Secondary | ICD-10-CM | POA: Insufficient documentation

## 2024-09-09 MED ORDER — ESTRADIOL 0.01 % VA CREA
1.0000 g | TOPICAL_CREAM | VAGINAL | 2 refills | Status: AC
Start: 2024-09-12 — End: ?

## 2024-09-09 NOTE — Telephone Encounter (Signed)
 MRI order placed.   Patient notified by MyChart message.

## 2024-09-09 NOTE — Telephone Encounter (Signed)
-----   Message from [name] sent at 09/09/2024  7:58 AM EST ----- Regarding: Breast MRI Please send referral for Breast MRI for high risk for breast cancer.

## 2024-09-14 ENCOUNTER — Ambulatory Visit: Payer: Self-pay | Admitting: Nurse Practitioner

## 2024-09-14 LAB — CYTOLOGY - PAP
Comment: NEGATIVE
Diagnosis: NEGATIVE
High risk HPV: NEGATIVE

## 2024-09-23 NOTE — Telephone Encounter (Signed)
 Per review of EPIC, [facility] has contacted patient x2 to attempt to schedule breast MRI.   Patient is scheduled to see genetic counselor on 10/19/24.   Routing to [facility] closed.

## 2024-09-23 NOTE — Telephone Encounter (Signed)
Received. Thank you.

## 2024-10-18 ENCOUNTER — Telehealth: Payer: Self-pay | Admitting: Nurse Practitioner

## 2024-10-18 NOTE — Telephone Encounter (Signed)
 Good Morning, this is courteous notification of patient rescheduling her Genetic Counseling Referral from Oct 19 2024 to Dec 07 2024. Thank you

## 2024-10-19 ENCOUNTER — Inpatient Hospital Stay: Admitting: Licensed Clinical Social Worker

## 2024-10-19 ENCOUNTER — Inpatient Hospital Stay

## 2024-12-07 ENCOUNTER — Inpatient Hospital Stay: Admitting: Licensed Clinical Social Worker

## 2024-12-07 ENCOUNTER — Inpatient Hospital Stay
